# Patient Record
Sex: Female | Born: 1945
Health system: Southern US, Community
[De-identification: ages and names within clinical notes are randomized; demographics above are authoritative.]

## PROBLEM LIST (undated history)

## (undated) DIAGNOSIS — K219 Gastro-esophageal reflux disease without esophagitis: Secondary | ICD-10-CM

## (undated) DIAGNOSIS — F329 Major depressive disorder, single episode, unspecified: Secondary | ICD-10-CM

## (undated) DIAGNOSIS — F32A Depression, unspecified: Secondary | ICD-10-CM

## (undated) DIAGNOSIS — Z5189 Encounter for other specified aftercare: Secondary | ICD-10-CM

## (undated) DIAGNOSIS — E7800 Pure hypercholesterolemia, unspecified: Secondary | ICD-10-CM

## (undated) DIAGNOSIS — Z8719 Personal history of other diseases of the digestive system: Secondary | ICD-10-CM

## (undated) DIAGNOSIS — D649 Anemia, unspecified: Secondary | ICD-10-CM

## (undated) DIAGNOSIS — I10 Essential (primary) hypertension: Secondary | ICD-10-CM

## (undated) DIAGNOSIS — N135 Crossing vessel and stricture of ureter without hydronephrosis: Secondary | ICD-10-CM

## (undated) DIAGNOSIS — M1990 Unspecified osteoarthritis, unspecified site: Secondary | ICD-10-CM

## (undated) DIAGNOSIS — I480 Paroxysmal atrial fibrillation: Secondary | ICD-10-CM

## (undated) DIAGNOSIS — E039 Hypothyroidism, unspecified: Secondary | ICD-10-CM

## (undated) DIAGNOSIS — I2510 Atherosclerotic heart disease of native coronary artery without angina pectoris: Secondary | ICD-10-CM

## (undated) DIAGNOSIS — I495 Sick sinus syndrome: Secondary | ICD-10-CM

## (undated) DIAGNOSIS — I219 Acute myocardial infarction, unspecified: Secondary | ICD-10-CM

## (undated) DIAGNOSIS — IMO0001 Reserved for inherently not codable concepts without codable children: Secondary | ICD-10-CM

## (undated) HISTORY — PX: CORONARY ANGIOPLASTY: SHX604

## (undated) HISTORY — DX: Pure hypercholesterolemia, unspecified: E78.00

## (undated) HISTORY — DX: Gastro-esophageal reflux disease without esophagitis: K21.9

## (undated) HISTORY — DX: Atherosclerotic heart disease of native coronary artery without angina pectoris: I25.10

## (undated) HISTORY — DX: Essential (primary) hypertension: I10

## (undated) HISTORY — PX: BREAST SURGERY: SHX581

---

## 1995-10-09 ENCOUNTER — Other Ambulatory Visit: Payer: Self-pay

## 1998-01-01 ENCOUNTER — Other Ambulatory Visit: Admission: RE | Admit: 1998-01-01 | Discharge: 1998-01-01 | Payer: Self-pay | Admitting: Obstetrics and Gynecology

## 1999-01-01 ENCOUNTER — Other Ambulatory Visit: Admission: RE | Admit: 1999-01-01 | Discharge: 1999-01-01 | Payer: Self-pay | Admitting: Obstetrics and Gynecology

## 1999-04-16 ENCOUNTER — Ambulatory Visit (HOSPITAL_COMMUNITY): Admission: RE | Admit: 1999-04-16 | Discharge: 1999-04-16 | Payer: Self-pay | Admitting: Surgery

## 1999-04-16 ENCOUNTER — Encounter (INDEPENDENT_AMBULATORY_CARE_PROVIDER_SITE_OTHER): Payer: Self-pay

## 1999-04-16 ENCOUNTER — Encounter: Payer: Self-pay | Admitting: Surgery

## 1999-04-24 ENCOUNTER — Encounter: Payer: Self-pay | Admitting: Surgery

## 1999-04-24 ENCOUNTER — Ambulatory Visit (HOSPITAL_BASED_OUTPATIENT_CLINIC_OR_DEPARTMENT_OTHER): Admission: RE | Admit: 1999-04-24 | Discharge: 1999-04-24 | Payer: Self-pay | Admitting: Surgery

## 1999-06-03 ENCOUNTER — Encounter: Payer: Self-pay | Admitting: Family Medicine

## 1999-06-03 ENCOUNTER — Encounter: Admission: RE | Admit: 1999-06-03 | Discharge: 1999-06-03 | Payer: Self-pay | Admitting: Family Medicine

## 1999-07-09 ENCOUNTER — Encounter: Payer: Self-pay | Admitting: Emergency Medicine

## 1999-07-09 ENCOUNTER — Inpatient Hospital Stay (HOSPITAL_COMMUNITY): Admission: EM | Admit: 1999-07-09 | Discharge: 1999-07-10 | Payer: Self-pay | Admitting: Emergency Medicine

## 2000-01-20 ENCOUNTER — Ambulatory Visit (HOSPITAL_COMMUNITY): Admission: RE | Admit: 2000-01-20 | Discharge: 2000-01-20 | Payer: Self-pay | Admitting: Gastroenterology

## 2000-01-20 ENCOUNTER — Encounter (INDEPENDENT_AMBULATORY_CARE_PROVIDER_SITE_OTHER): Payer: Self-pay | Admitting: Specialist

## 2000-03-18 ENCOUNTER — Encounter: Admission: RE | Admit: 2000-03-18 | Discharge: 2000-03-18 | Payer: Self-pay | Admitting: Family Medicine

## 2000-03-18 ENCOUNTER — Encounter: Payer: Self-pay | Admitting: Family Medicine

## 2000-04-19 ENCOUNTER — Other Ambulatory Visit: Admission: RE | Admit: 2000-04-19 | Discharge: 2000-04-19 | Payer: Self-pay | Admitting: *Deleted

## 2001-04-05 ENCOUNTER — Other Ambulatory Visit: Admission: RE | Admit: 2001-04-05 | Discharge: 2001-04-05 | Payer: Self-pay | Admitting: *Deleted

## 2001-09-27 ENCOUNTER — Encounter: Admission: RE | Admit: 2001-09-27 | Discharge: 2001-09-27 | Payer: Self-pay | Admitting: Family Medicine

## 2001-09-27 ENCOUNTER — Encounter: Payer: Self-pay | Admitting: Family Medicine

## 2002-07-02 ENCOUNTER — Other Ambulatory Visit: Admission: RE | Admit: 2002-07-02 | Discharge: 2002-07-02 | Payer: Self-pay | Admitting: Obstetrics and Gynecology

## 2002-10-18 ENCOUNTER — Encounter: Admission: RE | Admit: 2002-10-18 | Discharge: 2002-10-18 | Payer: Self-pay | Admitting: Family Medicine

## 2002-10-18 ENCOUNTER — Encounter: Payer: Self-pay | Admitting: Family Medicine

## 2003-07-08 ENCOUNTER — Other Ambulatory Visit: Admission: RE | Admit: 2003-07-08 | Discharge: 2003-07-08 | Payer: Self-pay | Admitting: Obstetrics and Gynecology

## 2003-10-21 ENCOUNTER — Encounter: Admission: RE | Admit: 2003-10-21 | Discharge: 2003-10-21 | Payer: Self-pay | Admitting: Family Medicine

## 2004-08-13 ENCOUNTER — Other Ambulatory Visit: Admission: RE | Admit: 2004-08-13 | Discharge: 2004-08-13 | Payer: Self-pay | Admitting: Obstetrics and Gynecology

## 2004-12-25 ENCOUNTER — Encounter: Admission: RE | Admit: 2004-12-25 | Discharge: 2004-12-25 | Payer: Self-pay | Admitting: Family Medicine

## 2005-11-04 ENCOUNTER — Other Ambulatory Visit: Admission: RE | Admit: 2005-11-04 | Discharge: 2005-11-04 | Payer: Self-pay | Admitting: Obstetrics and Gynecology

## 2006-02-01 ENCOUNTER — Encounter: Admission: RE | Admit: 2006-02-01 | Discharge: 2006-02-01 | Payer: Self-pay | Admitting: Obstetrics and Gynecology

## 2007-03-01 ENCOUNTER — Encounter: Admission: RE | Admit: 2007-03-01 | Discharge: 2007-03-01 | Payer: Self-pay | Admitting: Obstetrics and Gynecology

## 2007-03-16 ENCOUNTER — Other Ambulatory Visit: Admission: RE | Admit: 2007-03-16 | Discharge: 2007-03-16 | Payer: Self-pay | Admitting: Obstetrics and Gynecology

## 2008-04-02 ENCOUNTER — Encounter: Admission: RE | Admit: 2008-04-02 | Discharge: 2008-04-02 | Payer: Self-pay | Admitting: Obstetrics and Gynecology

## 2008-05-21 ENCOUNTER — Other Ambulatory Visit: Admission: RE | Admit: 2008-05-21 | Discharge: 2008-05-21 | Payer: Self-pay | Admitting: Family Medicine

## 2008-08-13 ENCOUNTER — Other Ambulatory Visit: Admission: RE | Admit: 2008-08-13 | Discharge: 2008-08-13 | Payer: Self-pay | Admitting: Obstetrics and Gynecology

## 2009-10-13 ENCOUNTER — Encounter: Admission: RE | Admit: 2009-10-13 | Discharge: 2009-10-13 | Payer: Self-pay | Admitting: Obstetrics and Gynecology

## 2010-12-18 NOTE — Consult Note (Signed)
[place]. [facility]  Patient:    Cheryl [name]                         MRN: [id number] Proc. Date: 07/10/99 Adm. Date:  [id number] Attending:  Anastasio Auerbach CC:         Luna Fuse, M.D.             Miguel Aschoff, M.D.                          Consultation Report  HISTORY OF PRESENT ILLNESS:  Cheryl [name] is a very pleasant 65 year old white female who has a history of hypertension, hypercholesterolemia, and gastroesophageal reflux disease.  Yesterday afternoon, the patient developed acute onset of sudden tachypalpitations associated with dry mouth, thick tongue, dyspnea, and lightheadedness.  She states she has had a couple of episodes like this in the ast over the years.  Her most recent was last Sunday, but prior to this, it was several years since her last episode.  She went to Dr. Lucille Passy office, and ECG there demonstrated supraventricular tachycardia, with a rate of 150.  She was referred for further evaluation.  Along with this, the patient has complained of epigastric discomfort, like indigestion.  She has early satiety, and has had some recent weight loss, as well.  She was placed on Prilosec recently for her acid reflux, but she was concerned that this caused her tachycardia last Sunday, so she stopped taking it.  Apparent recent gallbladder ultrasound was normal.  PAST MEDICAL HISTORY:  Significant for hypertension, hypercholesterolemia, gastroesophageal reflux disease, supraventricular tachycardia.  MEDICATIONS:  Include Cardizem CD 300 mg per day, Prevacid 30 mg per day, aspirin daily.  ALLERGIES:  She is allergic to PENICILLIN and CODEINE.  SOCIAL HISTORY:  She is married.  She quit smoking in 1979.  Denies alcohol or rug use.  FAMILY HISTORY:  Positive for coronary disease and mother having MI at age 24.  Father died of an MI at age 71.  She has no family history of diabetes or hypertension.  PHYSICAL  EXAMINATION:  GENERAL:  Patient is a pleasant white female in no apparent distress.  VITAL SIGNS:  Blood pressure 146/80, pulse 76 and regular, respirations normal,  temperature afebrile.  HEENT:  Unremarkable.  NECK:  Supple without JVD, adenopathy, thyromegaly, or bruits.  LUNGS:  Clear.  CARDIAC:  Reveals regular rate and rhythm, normal S1 and S2 without gallops, murmurs, rubs, or clicks.  ABDOMEN:  Obese, soft, with some epigastric tenderness to palpation.  There is o hepatosplenomegaly or masses.  EXTREMITIES:  Femoral and pedal pulses are 2+ and symmetric.  She has no edema.  NEUROLOGIC:  Intact.  LABORATORY DATA:  Hemoglobin is 13.2, white count 6800, platelets 382,000.  CK s 63 with negative MB.  Potassium is 3.0, BUN 11, glucose of 116.  ECG shows normal sinus rhythm, nonspecific ST changes.  Chest x-ray shows no active disease.  IMPRESSION: 1. Supraventricular tachycardia, probably AV node reentry versus atrial flutter    with 2 to 1 block. 2. Hypertension. 3. Atypical chest pain.  Symptoms are most consistent with gastroesophageal reflux    disease.  However, she does have multiple cardiac risk factors, and coronary    disease needs to be considered. 4. Gastroesophageal reflux disease.  PLAN:  I think it would be appropriate to continue on Cardizem CD at 300 mg a day  and increase her antacid therapy.  We can schedule the patient for an outpatient stress Cardiolite study to evaluate for potential coronary ischemia. Otherwise, I think she is stable for discharge today. DD:  07/10/99 TD:  07/10/99 Job: [id number]

## 2010-12-18 NOTE — Procedures (Signed)
[facility]  Patient:    Cheryl [name], Cheryl [name]                        MRN: [id number] Proc. Date: 01/20/00 Adm. Date:  [id number] Disc. Date: [id number] Attending:  Rich Brave CC:         [facility]                           Procedure Report  PROCEDURE:  Upper endoscopy with biopsies.  INDICATION FOR PROCEDURE:  A 65 year old female with a history of iron deficiency anemia and reflux symptoms.  FINDINGS:  Minimal hiatal hernia.  DESCRIPTION OF PROCEDURE:   The nature, purpose and risks of the procedure had been discussed with the patient who provided written consent. Sedation was fentanyl 62.5 mcg and Versed 8 mg IV without arrhythmias or desaturation.  The Olympus video endoscope was passed under direct vision. The posterior commissure appeared slightly thickened but the larynx was otherwise normal. The esophagus was easily entered and had normal mucosa without evidence of reflux esophagitis, Barretts esophagitis, gastroesophageal reflux, varices, infection or neoplasia. No ring or stricture was present. A small hiatal hernia measuring about 1 cm was intermittently present but on retroflexed view no significant hiatal hernia was observed. The stomach contained on residual and had normal mucosa without evidence of gastritis, erosions, ulcers, polyps or masses and the pylorus, duodenal bulb and second duodenum looked normal. Random duodenal mucosal biopsies were obtained to rule out celiac disease in view of the longstanding history of iron deficiency anemia which is attributed most likely to the patients history of menstruation. The scope was then removed from the patient who tolerated the procedure well and without apparent complications.  IMPRESSION: 1. Possible laryngeal pharyngeal manifestation of gastroesophageal reflux    disease characterized by thickening of the posterior commissure as    described above. 2. No  esophagitis to correlate with heartburn symptoms. 3. No overt cause for iron deficiency anemia endoscopically evident.  PLAN:  Await pathology on biopsies. Proceed to colonoscopic evaluation. DD: 01/20/00 TD:  01/22/00 Job: [id number]

## 2010-12-18 NOTE — Procedures (Signed)
[facility]  Patient:    Cheryl [name], Cheryl [name]                        MRN: [id number] Proc. Date: 01/20/00 Adm. Date:  [id number] Disc. Date: [id number] Attending:  Rich Brave CC:         [facility]                           Procedure Report  PROCEDURE:  Colonoscopy with biopsies.  ENDOSCOPIST:  Florencia Reasons, M.D.  INDICATION:  [age]-year-old female with longstanding history of iron deficiency anemia, felt most likely to be of menstrual origin.  FINDINGS:  Normal exam to the cecum, except for patchy erythema felt most likely to be due to scope trauma.  DESCRIPTION OF PROCEDURE:  The nature, purpose and risks of the procedure had been discussed with the patient, who provided written consent.  Sedation for this procedure and the upper endoscopy which preceded it totaled Fentanyl 120 mcg and Versed 10 mg IV, without arrhythmias or desaturation.  The Olympus adult video colonoscope was advanced around the colon, with some looping that was overcome by a combination of factors including sterno-abdominal compression and having patient in supine position; we also turned the patient into the right lateral decubitus position to allow the tip of the scope to enter the base of the cecum, which was identified by typical cecal appearance and the absence of further lumen.  Pullback was then performed.  The quality of the prep was very good and it was felt that all areas were adequately seen.  The only abnormality on this exam was some patchy erythema of the colonic mucosa here and there, and especially at "fulcrum" points, felt most likely to be due to intramucosal hemorrhage from scope trauma.  There was a little bit of mucoid overlying exudate which would basically wash off but no erosive changes or ulceration, no diffuse colitis, normal colonic mucosa in the intervening areas, no ulcerations.  No polyps, cancer, diffuse  colitis, vascular malformations or diverticular disease were appreciated and retroflexion in the rectum was unremarkable, although an optimal view of the distal rectum was not obtained due to an overlying valve of [place]; antegrade viewing of the distal rectum and anal canal, however, was unremarkable, including the absence of any significant hemorrhoids.  The patient tolerated the procedure well and there were no apparent complications.  IMPRESSION:  Essentially normal examination, with questionable colitis (more likely scope trauma) here and there in the colon, as described above.  PLAN:  Await pathology on biopsies of the possibly inflamed areas.  It is not felt that the observed findings today would account for iron deficiency anemia. DD:  01/20/00 TD:  01/22/00 Job: [id number]

## 2011-08-03 DIAGNOSIS — I2510 Atherosclerotic heart disease of native coronary artery without angina pectoris: Secondary | ICD-10-CM

## 2011-08-03 HISTORY — DX: Atherosclerotic heart disease of native coronary artery without angina pectoris: I25.10

## 2011-08-27 ENCOUNTER — Encounter: Payer: Self-pay | Admitting: Internal Medicine

## 2011-09-07 ENCOUNTER — Telehealth: Payer: Self-pay | Admitting: Internal Medicine

## 2011-09-07 NOTE — Telephone Encounter (Signed)
I was contacted by [employer].  The patient had a 3 second pause after converting from atrial fibrillation to sinus rhythm.  She was mildly light-headed, but no syncope.  She is in sinus now.  Tele strips will be faxed to office.

## 2011-09-09 ENCOUNTER — Encounter: Payer: Self-pay | Admitting: Internal Medicine

## 2011-09-10 ENCOUNTER — Ambulatory Visit (INDEPENDENT_AMBULATORY_CARE_PROVIDER_SITE_OTHER): Payer: Self-pay | Admitting: Internal Medicine

## 2011-09-10 ENCOUNTER — Encounter: Payer: Self-pay | Admitting: Internal Medicine

## 2011-09-10 VITALS — BP 148/88 | HR 78 | Ht 63.0 in | Wt 185.0 lb

## 2011-09-10 DIAGNOSIS — R0989 Other specified symptoms and signs involving the circulatory and respiratory systems: Secondary | ICD-10-CM

## 2011-09-10 DIAGNOSIS — I4891 Unspecified atrial fibrillation: Secondary | ICD-10-CM

## 2011-09-13 ENCOUNTER — Ambulatory Visit (INDEPENDENT_AMBULATORY_CARE_PROVIDER_SITE_OTHER): Payer: BC Managed Care – PPO | Admitting: Internal Medicine

## 2011-09-13 ENCOUNTER — Encounter: Payer: Self-pay | Admitting: Internal Medicine

## 2011-09-13 VITALS — BP 142/76 | HR 68 | Ht 63.0 in | Wt 184.4 lb

## 2011-09-13 DIAGNOSIS — I498 Other specified cardiac arrhythmias: Secondary | ICD-10-CM

## 2011-09-13 DIAGNOSIS — I10 Essential (primary) hypertension: Secondary | ICD-10-CM

## 2011-09-13 DIAGNOSIS — R001 Bradycardia, unspecified: Secondary | ICD-10-CM

## 2011-09-13 DIAGNOSIS — I4891 Unspecified atrial fibrillation: Secondary | ICD-10-CM

## 2011-09-13 NOTE — Progress Notes (Signed)
HPI Cheryl [name] is referred today by Dr. Mayford Knife for evaluation of symptomatic tachy-brady syndrome. She has a h/o atrial arrhythmias for years and has recently developed episodes of dizziness associated with lightheadedness. No frank syncope. She worn a cardiac monitor which demonstrates atrial fib with an RVR as well as daytime pauses of over 3 seconds. She has never tried an anti-arrhythmic drug outside of calcium channel or beta blockers. She has hypertension and despite being on doses of, she remains hypertensive. Allergies  Allergen Reactions  . Codeine     nausea     Current Outpatient Prescriptions  Medication Sig Dispense Refill  . CRESTOR 10 MG tablet Take 10 mg by mouth Daily.      Marland Kitchen diltiazem (CARDIZEM CD) 300 MG 24 hr capsule Take 300 mg by mouth Daily.      Marland Kitchen levothyroxine (SYNTHROID, LEVOTHROID) 100 MCG tablet Take 100 mcg by mouth Daily.      . metoprolol succinate (TOPROL-XL) 50 MG 24 hr tablet Take 50 mg by mouth Daily.      . potassium chloride SA (K-DUR,KLOR-CON) 20 MEQ tablet Take 20 mEq by mouth Daily.      Marland Kitchen triamterene-hydrochlorothiazide (MAXZIDE) 75-50 MG per tablet Take 75 mg by mouth Daily.         History reviewed. No pertinent past medical history.  ROS:   All systems reviewed and negative except as noted in the HPI.   History reviewed. No pertinent past surgical history.   No family history on file.   History   Social History  . Marital Status: Married    Spouse Name: N/A    Number of Children: N/A  . Years of Education: N/A   Occupational History  . Not on file.   Social History Main Topics  . Smoking status: Former Games developer  . Smokeless tobacco: Not on file  . Alcohol Use: Not on file  . Drug Use: Not on file  . Sexually Active: Not on file   Other Topics Concern  . Not on file   Social History Narrative  . No narrative on file     BP 142/76  Pulse 68  Ht 5\' 3"  (1.6 m)  Wt 83.643 kg (184 lb 6.4 oz)  BMI 32.66  kg/m2  Physical Exam:  Well appearing middle-aged woman, NAD HEENT: Unremarkable Neck:  No JVD, no thyromegally Lungs:  Clear with no wheezes, rales, or rhonchi. HEART:  Regular rate rhythm, no murmurs, no rubs, no clicks Abd:  Obese, soft, positive bowel sounds, no organomegally, no rebound, no guarding Ext:  2 plus pulses, no edema, no cyanosis, no clubbing Skin:  No rashes no nodules Neuro:  CN II through XII intact, motor grossly intact  Tele - atrial fibrillation with posttermination pauses as well as sinus rhythm and sinus bradycardia  Assess/Plan:

## 2011-09-13 NOTE — Assessment & Plan Note (Signed)
Her blood pressure is elevated today. Stopping her calcium channel blocker may result in additional blood pressure increases. If this is the case, we would then consider adding another agent for blood pressure control.

## 2011-09-13 NOTE — Assessment & Plan Note (Signed)
I discussed the treatment options with the patient and her husband. One option would be admission to the hospital with initiation of tikosyn. We would stop her calcium channel blocker at that time. A second option would be initiation of flecainide and insertion of a permanent pacemaker. The advantages and disadvantages of each approach have been discussed in detail. In hopes of avoiding a pacemaker, and maintaining sinus rhythm, she wishes to initially try Tikosyn and this will be initiated in the next several days. She understands that this approach may not work and trying flecainide may ultimately be required in conjunction with a permanent pacemaker.

## 2011-09-13 NOTE — Patient Instructions (Signed)
Your physician recommends that you return for lab work on: Monday 09/20/11- bmp/magnesium   You will be admitted for Tikosyn loading on Tuesday 09/21/11 if your magnesium and potassium levels are ok on Monday

## 2011-09-20 ENCOUNTER — Telehealth: Payer: Self-pay | Admitting: Internal Medicine

## 2011-09-20 ENCOUNTER — Other Ambulatory Visit (INDEPENDENT_AMBULATORY_CARE_PROVIDER_SITE_OTHER): Payer: BC Managed Care – PPO | Admitting: *Deleted

## 2011-09-20 DIAGNOSIS — I4891 Unspecified atrial fibrillation: Secondary | ICD-10-CM

## 2011-09-20 LAB — BASIC METABOLIC PANEL
BUN: 16 mg/dL (ref 6–23)
Calcium: 9.3 mg/dL (ref 8.4–10.5)
Chloride: 99 mEq/L (ref 96–112)
Potassium: 3.2 mEq/L — ABNORMAL LOW (ref 3.5–5.1)
Sodium: 141 mEq/L (ref 135–145)

## 2011-09-20 LAB — MAGNESIUM: Magnesium: 1.8 mg/dL (ref 1.5–2.5)

## 2011-09-20 NOTE — Telephone Encounter (Signed)
New Problem   Patient would like return call on hm# regarding tikosyn admission previously discussed by Dr. Ladona Ridgel. Patient can be reached at hm# [phone]

## 2011-09-22 ENCOUNTER — Other Ambulatory Visit: Payer: BC Managed Care – PPO

## 2011-09-22 ENCOUNTER — Encounter (INDEPENDENT_AMBULATORY_CARE_PROVIDER_SITE_OTHER): Payer: BC Managed Care – PPO | Admitting: Pharmacist

## 2011-09-22 DIAGNOSIS — I4891 Unspecified atrial fibrillation: Secondary | ICD-10-CM

## 2011-09-22 LAB — BASIC METABOLIC PANEL
Chloride: 100 mEq/L (ref 96–112)
Glucose, Bld: 106 mg/dL — ABNORMAL HIGH (ref 70–99)
Sodium: 141 mEq/L (ref 135–145)

## 2011-09-22 LAB — MAGNESIUM: Magnesium: 1.5 mg/dL (ref 1.5–2.5)

## 2011-09-23 NOTE — Telephone Encounter (Signed)
Cheryl Endo, RN spoke with pt on 2/18.  She was instructed to take of K on 2/18 in the PM and three times on 2/19 and we would recheck her labs on 2/20 for possible admission to start Tikosyn.

## 2011-09-27 ENCOUNTER — Telehealth: Payer: Self-pay | Admitting: Internal Medicine

## 2011-09-27 NOTE — Telephone Encounter (Signed)
New Msg: Pt calling wanting to speak with nurse/MD regarding pt tikosyn and if MD wants pt to come in for appt.   Please returning pt call to discuss further.

## 2011-09-27 NOTE — Telephone Encounter (Signed)
Will schedule a follow up with Dr Ladona Ridgel in the next couple of weeks

## 2011-09-29 NOTE — Progress Notes (Signed)
HPI Case reviewed.  Patient referred by Dr. Mayford Knife.  Needs to be seen by EP.  Appt set for next clinic day.   Assessment and Plan:

## 2011-10-01 DIAGNOSIS — I495 Sick sinus syndrome: Secondary | ICD-10-CM

## 2011-10-01 HISTORY — DX: Sick sinus syndrome: I49.5

## 2011-10-06 ENCOUNTER — Ambulatory Visit (INDEPENDENT_AMBULATORY_CARE_PROVIDER_SITE_OTHER): Payer: BC Managed Care – PPO | Admitting: Internal Medicine

## 2011-10-06 ENCOUNTER — Encounter: Payer: Self-pay | Admitting: *Deleted

## 2011-10-06 DIAGNOSIS — I10 Essential (primary) hypertension: Secondary | ICD-10-CM

## 2011-10-06 DIAGNOSIS — I4891 Unspecified atrial fibrillation: Secondary | ICD-10-CM

## 2011-10-06 NOTE — Assessment & Plan Note (Signed)
Her blood pressure today is fairly well controlled. Additional medication adjustment will be recommended after her pacemaker has been inserted.

## 2011-10-06 NOTE — Progress Notes (Signed)
HPI Cheryl [name] returns today for followup. When I saw the patient several weeks ago, we identified 2 problems. First she had paroxysmal atrial fibrillation and second she had sinus bradycardia with pauses of up to 3 seconds. Her pauses occurred when atrial fibrillation would terminate. We discussed initiation of dofetilide versus a pacemaker and other antiarrhythmic drugs. Initially, dofetilide was chosen but after careful consideration, the patient noted that she has trouble with compliance. She frequently takes her medicines hours late or sometimes forgets to take them altogether. For this reason it was deemed most appropriate not to use dofetilide. She returns for additional followup. She has had no frank syncope but does note occasional dizzy spells. She feels her heart go into atrial fibrillation. Allergies  Allergen Reactions  . Codeine     nausea     Current Outpatient Prescriptions  Medication Sig Dispense Refill  . aspirin 81 MG tablet Take 81 mg by mouth daily.      . CRESTOR 10 MG tablet Take 10 mg by mouth Daily.      Marland Kitchen diltiazem (CARDIZEM CD) 300 MG 24 hr capsule Take 300 mg by mouth Daily.      Marland Kitchen levothyroxine (SYNTHROID, LEVOTHROID) 100 MCG tablet Take 100 mcg by mouth Daily.      . metoprolol succinate (TOPROL-XL) 50 MG 24 hr tablet Take 50 mg by mouth Daily.      . potassium chloride SA (K-DUR,KLOR-CON) 20 MEQ tablet Take 20 mEq by mouth Daily.      Marland Kitchen triamterene-hydrochlorothiazide (MAXZIDE) 75-50 MG per tablet Take 75 mg by mouth Daily.         Past Medical History  Diagnosis Date  . HTN (hypertension)   . Hypercholesteremia   . GERD (gastroesophageal reflux disease)   . Supraventricular tachycardia     ROS:   All systems reviewed and negative except as noted in the HPI.   No past surgical history on file.   Family History  Problem Relation Age of Onset  . Heart attack Father 53     History   Social History  . Marital Status: Married    Spouse Name:  N/A    Number of Children: N/A  . Years of Education: N/A   Occupational History  . Not on file.   Social History Main Topics  . Smoking status: Former Smoker    Quit date: 08/02/1977  . Smokeless tobacco: Not on file  . Alcohol Use: No  . Drug Use: No  . Sexually Active: Not on file   Other Topics Concern  . Not on file   Social History Narrative  . No narrative on file     BP 138/80  Pulse 68  Ht 5\' 3"  (1.6 m)  Wt 85.911 kg (189 lb 6.4 oz)  BMI 33.55 kg/m2  Physical Exam:  Well appearing middle-aged woman, NAD HEENT: Unremarkable Neck:  No JVD, no thyromegally Lungs:  Clear with no wheezes, rales, or rhonchi. HEART:  Regular rate rhythm, no murmurs, no rubs, no clicks Abd:  soft, positive bowel sounds, no organomegally, no rebound, no guarding Ext:  2 plus pulses, no edema, no cyanosis, no clubbing Skin:  No rashes no nodules Neuro:  CN II through XII intact, motor grossly intact    Assess/Plan:

## 2011-10-06 NOTE — Assessment & Plan Note (Signed)
Today we discussed the treatment options for atrial fibrillation and tachybradycardia syndrome. At this point I recommended proceeding with permanent pacemaker insertion secondary to her prolonged pauses. In addition antiarrhythmic drug therapy with flecainide will be started after her pacemaker has been inserted.

## 2011-10-06 NOTE — Patient Instructions (Signed)

## 2011-10-07 ENCOUNTER — Other Ambulatory Visit: Payer: Self-pay | Admitting: *Deleted

## 2011-10-07 DIAGNOSIS — I4891 Unspecified atrial fibrillation: Secondary | ICD-10-CM

## 2011-10-08 NOTE — Progress Notes (Unsigned)
HPI [name] was seen today for possible admission to begin Tikosyn.   She was recently seen by Dr. Ladona Ridgel for her atrial arrhythmias.  Her recent cardiac monitor showed afib with RVR as well as pauses over 3 seconds.  They discussed 2 different treatment options, including Tikosyn versus pacemaker placement and flecainide. Pt decided to proceed with Tikosyn at this time.    Reviewed pt's medication list.  She has not ever taken any other antiarrhythmic medications.  She is currently rate controlled on diltiazem.  She is not on any QTc prolongating medications, but she is on triamterene/HCTZ, which will have to be discontinued when she starts Tikosyn.  Compliance reviewed with pt.  She states she may have trouble remembering to take her medications on a consistent basis.    EKG- NSR with QTc- 429  Current Outpatient Prescriptions on File Prior to Visit  Medication Sig Dispense Refill  . CRESTOR 10 MG tablet Take 10 mg by mouth Daily.      Marland Kitchen diltiazem (CARDIZEM CD) 300 MG 24 hr capsule Take 300 mg by mouth Daily.      Marland Kitchen levothyroxine (SYNTHROID, LEVOTHROID) 100 MCG tablet Take 100 mcg by mouth Daily.      . metoprolol succinate (TOPROL-XL) 50 MG 24 hr tablet Take 50 mg by mouth Daily.      . potassium chloride SA (K-DUR,KLOR-CON) 20 MEQ tablet Take 20 mEq by mouth Daily.      Marland Kitchen triamterene-hydrochlorothiazide (MAXZIDE) 75-50 MG per tablet Take 75 mg by mouth Daily.        Allergies  Allergen Reactions  . Codeine     nausea    ASSESSMENT/PLAN I discussed concerns of patient compliance with Dr. Ladona Ridgel.  Since pt is unsure if she would be able to consistently take the medication, he thought it best to delay hospitalization and discuss other options.  Pt is agreeable to this plan.  She will be set up with an appt to see Dr. Ladona Ridgel in the near future.

## 2011-10-15 ENCOUNTER — Encounter (HOSPITAL_COMMUNITY): Payer: Self-pay | Admitting: Pharmacy Technician

## 2011-10-21 ENCOUNTER — Other Ambulatory Visit (INDEPENDENT_AMBULATORY_CARE_PROVIDER_SITE_OTHER): Payer: BC Managed Care – PPO

## 2011-10-21 DIAGNOSIS — I4891 Unspecified atrial fibrillation: Secondary | ICD-10-CM

## 2011-10-21 LAB — BASIC METABOLIC PANEL
CO2: 32 mEq/L (ref 19–32)
Calcium: 9.4 mg/dL (ref 8.4–10.5)
Chloride: 96 mEq/L (ref 96–112)
Sodium: 137 mEq/L (ref 135–145)

## 2011-10-21 LAB — CBC WITH DIFFERENTIAL/PLATELET
Basophils Absolute: 0.1 10*3/uL (ref 0.0–0.1)
Eosinophils Absolute: 0.1 10*3/uL (ref 0.0–0.7)
Lymphocytes Relative: 44.6 % (ref 12.0–46.0)
MCHC: 34.1 g/dL (ref 30.0–36.0)
Monocytes Relative: 9.1 % (ref 3.0–12.0)
Platelets: 272 10*3/uL (ref 150.0–400.0)
RDW: 13.8 % (ref 11.5–14.6)

## 2011-10-21 LAB — PROTIME-INR
INR: 1 ratio (ref 0.8–1.0)
Prothrombin Time: 11 s (ref 10.2–12.4)

## 2011-10-22 ENCOUNTER — Telehealth: Payer: Self-pay | Admitting: *Deleted

## 2011-10-22 NOTE — Telephone Encounter (Signed)
Fu call °Patient returning your call °

## 2011-10-22 NOTE — Telephone Encounter (Signed)
Reviewing labs and noted K+ WAS 2.9--PT scheduled for pacer insertion on 3/28--i tried to call pt but no answer--LM for her to call back--also reviewed labs with dr Patty Sermons and he would like pt to increase KCL to  TID with repeat BMET two days before procedure--when pt calls back will give her the instructions--nt

## 2011-10-25 ENCOUNTER — Other Ambulatory Visit: Payer: Self-pay | Admitting: *Deleted

## 2011-10-25 MED ORDER — POTASSIUM CHLORIDE CRYS ER 20 MEQ PO TBCR: 20.0000 meq | EXTENDED_RELEASE_TABLET | Freq: Two times a day (BID) | ORAL | Status: DC

## 2011-10-25 NOTE — Telephone Encounter (Signed)
Spoke with patient and she is aware to increase her Potassium.  I sent in a RX for her also

## 2011-10-25 NOTE — Telephone Encounter (Signed)
Follow up from previous called.   Patient calling back to speak with nurse regarding upcoming procedure, also to discuss k+

## 2011-10-27 MED ORDER — CEFAZOLIN SODIUM-DEXTROSE 2-3 GM-% IV SOLR
2.0000 g | INTRAVENOUS | Status: AC
  Administered 2011-10-28: 2 g via INTRAVENOUS
  Filled 2011-10-27: qty 50

## 2011-10-27 MED ORDER — SODIUM CHLORIDE 0.9 % IR SOLN
80.0000 mg | Status: AC
  Administered 2011-10-28: 80 mg
  Filled 2011-10-27: qty 2

## 2011-10-27 MED ORDER — SODIUM CHLORIDE 0.45 % IV SOLN: INTRAVENOUS | Status: DC

## 2011-10-27 MED ORDER — CHLORHEXIDINE GLUCONATE 4 % EX LIQD
60.0000 mL | Freq: Once | CUTANEOUS | Status: DC
  Filled 2011-10-27: qty 60

## 2011-10-27 MED ORDER — SODIUM CHLORIDE 0.9 % IV SOLN
INTRAVENOUS | Status: DC
  Administered 2011-10-28: 06:00:00 via INTRAVENOUS

## 2011-10-28 ENCOUNTER — Encounter (HOSPITAL_COMMUNITY)
Admission: RE | Disposition: A | Discharge status: Home / Self Care | Payer: Self-pay | Source: Ambulatory Visit | Attending: Internal Medicine

## 2011-10-28 ENCOUNTER — Ambulatory Visit (HOSPITAL_COMMUNITY)
Admission: RE | Admit: 2011-10-28 | Discharge: 2011-10-29 | Disposition: A | Discharge status: Home / Self Care | Payer: BC Managed Care – PPO | Source: Ambulatory Visit | Attending: Internal Medicine | Admitting: Internal Medicine

## 2011-10-28 ENCOUNTER — Other Ambulatory Visit: Payer: Self-pay

## 2011-10-28 ENCOUNTER — Encounter (HOSPITAL_COMMUNITY): Payer: Self-pay | Admitting: General Practice

## 2011-10-28 DIAGNOSIS — E7800 Pure hypercholesterolemia, unspecified: Secondary | ICD-10-CM | POA: Insufficient documentation

## 2011-10-28 DIAGNOSIS — I4891 Unspecified atrial fibrillation: Secondary | ICD-10-CM

## 2011-10-28 DIAGNOSIS — I495 Sick sinus syndrome: Secondary | ICD-10-CM | POA: Insufficient documentation

## 2011-10-28 DIAGNOSIS — E039 Hypothyroidism, unspecified: Secondary | ICD-10-CM | POA: Insufficient documentation

## 2011-10-28 DIAGNOSIS — Z23 Encounter for immunization: Secondary | ICD-10-CM | POA: Insufficient documentation

## 2011-10-28 DIAGNOSIS — I4819 Other persistent atrial fibrillation: Secondary | ICD-10-CM | POA: Insufficient documentation

## 2011-10-28 DIAGNOSIS — I10 Essential (primary) hypertension: Secondary | ICD-10-CM | POA: Insufficient documentation

## 2011-10-28 DIAGNOSIS — R001 Bradycardia, unspecified: Secondary | ICD-10-CM

## 2011-10-28 HISTORY — DX: Personal history of other diseases of the digestive system: Z87.19

## 2011-10-28 HISTORY — DX: Encounter for other specified aftercare: Z51.89

## 2011-10-28 HISTORY — DX: Sick sinus syndrome: I49.5

## 2011-10-28 HISTORY — DX: Unspecified osteoarthritis, unspecified site: M19.90

## 2011-10-28 HISTORY — PX: PERMANENT PACEMAKER INSERTION: SHX5480

## 2011-10-28 HISTORY — DX: Anemia, unspecified: D64.9

## 2011-10-28 HISTORY — DX: Reserved for inherently not codable concepts without codable children: IMO0001

## 2011-10-28 HISTORY — DX: Paroxysmal atrial fibrillation: I48.0

## 2011-10-28 HISTORY — DX: Hypothyroidism, unspecified: E03.9

## 2011-10-28 LAB — BASIC METABOLIC PANEL
BUN: 14 mg/dL (ref 6–23)
Chloride: 100 mEq/L (ref 96–112)
GFR calc Af Amer: 62 mL/min — ABNORMAL LOW (ref >90)
Potassium: 3.2 mEq/L — ABNORMAL LOW (ref 3.5–5.1)
Sodium: 140 mEq/L (ref 135–145)

## 2011-10-28 LAB — SURGICAL PCR SCREEN: Staphylococcus aureus: NEGATIVE

## 2011-10-28 SURGERY — PERMANENT PACEMAKER INSERTION
Anesthesia: LOCAL

## 2011-10-28 MED ORDER — HEPARIN (PORCINE) IN NACL 2-0.9 UNIT/ML-% IJ SOLN
INTRAMUSCULAR | Status: AC
  Filled 2011-10-28: qty 1000

## 2011-10-28 MED ORDER — LEVOTHYROXINE SODIUM 100 MCG PO TABS
100.0000 ug | ORAL_TABLET | Freq: Every day | ORAL | Status: DC
  Administered 2011-10-29: 100 ug via ORAL
  Filled 2011-10-28 (×2): qty 1

## 2011-10-28 MED ORDER — MUPIROCIN 2 % EX OINT
TOPICAL_OINTMENT | CUTANEOUS | Status: AC
  Administered 2011-10-28: 1 via NASAL
  Filled 2011-10-28: qty 22

## 2011-10-28 MED ORDER — FLECAINIDE ACETATE 100 MG PO TABS
100.0000 mg | ORAL_TABLET | Freq: Two times a day (BID) | ORAL | Status: DC
  Administered 2011-10-28 – 2011-10-29 (×3): 100 mg via ORAL
  Filled 2011-10-28 (×4): qty 1

## 2011-10-28 MED ORDER — LIDOCAINE HCL (PF) 1 % IJ SOLN
INTRAMUSCULAR | Status: AC
  Filled 2011-10-28: qty 60

## 2011-10-28 MED ORDER — CEFAZOLIN SODIUM-DEXTROSE 2-3 GM-% IV SOLR
INTRAVENOUS | Status: AC
  Administered 2011-10-28: 2 g via INTRAVENOUS
  Filled 2011-10-28: qty 50

## 2011-10-28 MED ORDER — METOPROLOL SUCCINATE ER 50 MG PO TB24
50.0000 mg | ORAL_TABLET | Freq: Every day | ORAL | Status: DC
  Administered 2011-10-28 – 2011-10-29 (×2): 50 mg via ORAL
  Filled 2011-10-28 (×2): qty 1

## 2011-10-28 MED ORDER — CEFAZOLIN SODIUM-DEXTROSE 2-3 GM-% IV SOLR
2.0000 g | Freq: Three times a day (TID) | INTRAVENOUS | Status: AC
  Administered 2011-10-28 – 2011-10-29 (×3): 2 g via INTRAVENOUS
  Filled 2011-10-28 (×4): qty 50

## 2011-10-28 MED ORDER — ASPIRIN 81 MG PO TABS: 81.0000 mg | ORAL_TABLET | Freq: Every day | ORAL | Status: DC

## 2011-10-28 MED ORDER — MIDAZOLAM HCL 5 MG/5ML IJ SOLN
INTRAMUSCULAR | Status: AC
  Filled 2011-10-28: qty 5

## 2011-10-28 MED ORDER — POTASSIUM CHLORIDE CRYS ER 20 MEQ PO TBCR
20.0000 meq | EXTENDED_RELEASE_TABLET | Freq: Two times a day (BID) | ORAL | Status: DC
  Administered 2011-10-28 – 2011-10-29 (×3): 20 meq via ORAL
  Filled 2011-10-28 (×4): qty 1

## 2011-10-28 MED ORDER — ASPIRIN 81 MG PO CHEW
81.0000 mg | CHEWABLE_TABLET | Freq: Every day | ORAL | Status: DC
  Administered 2011-10-28 – 2011-10-29 (×2): 81 mg via ORAL
  Filled 2011-10-28 (×2): qty 1

## 2011-10-28 MED ORDER — ACETAMINOPHEN 325 MG PO TABS
325.0000 mg | ORAL_TABLET | ORAL | Status: DC | PRN
  Administered 2011-10-28 – 2011-10-29 (×6): 650 mg via ORAL
  Filled 2011-10-28 (×6): qty 2

## 2011-10-28 MED ORDER — FENTANYL CITRATE 0.05 MG/ML IJ SOLN
INTRAMUSCULAR | Status: AC
  Filled 2011-10-28: qty 2

## 2011-10-28 MED ORDER — ONDANSETRON HCL 4 MG/2ML IJ SOLN: 4.0000 mg | Freq: Four times a day (QID) | INTRAMUSCULAR | Status: DC | PRN

## 2011-10-28 MED ORDER — TRIAMTERENE-HCTZ 75-50 MG PO TABS
1.0000 | ORAL_TABLET | Freq: Every day | ORAL | Status: DC
  Administered 2011-10-28 – 2011-10-29 (×2): 1 via ORAL
  Filled 2011-10-28 (×2): qty 1

## 2011-10-28 MED ORDER — INFLUENZA VIRUS VACC SPLIT PF IM SUSP
0.5000 mL | Freq: Once | INTRAMUSCULAR | Status: AC
  Administered 2011-10-29: 0.5 mL via INTRAMUSCULAR
  Filled 2011-10-28 (×2): qty 0.5

## 2011-10-28 NOTE — H&P (View-Only) (Signed)
HPI Cheryl [name] returns today for followup. When I saw the patient several weeks ago, we identified 2 problems. First she had paroxysmal atrial fibrillation and second she had sinus bradycardia with pauses of up to 3 seconds. Her pauses occurred when atrial fibrillation would terminate. We discussed initiation of dofetilide versus a pacemaker and other antiarrhythmic drugs. Initially, dofetilide was chosen but after careful consideration, the patient noted that she has trouble with compliance. She frequently takes her medicines hours late or sometimes forgets to take them altogether. For this reason it was deemed most appropriate not to use dofetilide. She returns for additional followup. She has had no frank syncope but does note occasional dizzy spells. She feels her heart go into atrial fibrillation. Allergies  Allergen Reactions  . Codeine     nausea     Current Outpatient Prescriptions  Medication Sig Dispense Refill  . aspirin 81 MG tablet Take 81 mg by mouth daily.      . CRESTOR 10 MG tablet Take 10 mg by mouth Daily.      . diltiazem (CARDIZEM CD) 300 MG 24 hr capsule Take 300 mg by mouth Daily.      . levothyroxine (SYNTHROID, LEVOTHROID) 100 MCG tablet Take 100 mcg by mouth Daily.      . metoprolol succinate (TOPROL-XL) 50 MG 24 hr tablet Take 50 mg by mouth Daily.      . potassium chloride SA (K-DUR,KLOR-CON) 20 MEQ tablet Take 20 mEq by mouth Daily.      . triamterene-hydrochlorothiazide (MAXZIDE) 75-50 MG per tablet Take 75 mg by mouth Daily.         Past Medical History  Diagnosis Date  . HTN (hypertension)   . Hypercholesteremia   . GERD (gastroesophageal reflux disease)   . Supraventricular tachycardia     ROS:   All systems reviewed and negative except as noted in the HPI.   No past surgical history on file.   Family History  Problem Relation Age of Onset  . Heart attack Father 42     History   Social History  . Marital Status: Married    Spouse Name:  N/A    Number of Children: N/A  . Years of Education: N/A   Occupational History  . Not on file.   Social History Main Topics  . Smoking status: Former Smoker    Quit date: 08/02/1977  . Smokeless tobacco: Not on file  . Alcohol Use: No  . Drug Use: No  . Sexually Active: Not on file   Other Topics Concern  . Not on file   Social History Narrative  . No narrative on file     BP 138/80  Pulse 68  Ht 5' 3" (1.6 m)  Wt 85.911 kg (189 lb 6.4 oz)  BMI 33.55 kg/m2  Physical Exam:  Well appearing middle-aged woman, NAD HEENT: Unremarkable Neck:  No JVD, no thyromegally Lungs:  Clear with no wheezes, rales, or rhonchi. HEART:  Regular rate rhythm, no murmurs, no rubs, no clicks Abd:  soft, positive bowel sounds, no organomegally, no rebound, no guarding Ext:  2 plus pulses, no edema, no cyanosis, no clubbing Skin:  No rashes no nodules Neuro:  CN II through XII intact, motor grossly intact    Assess/Plan:   

## 2011-10-28 NOTE — Op Note (Signed)
DDD PPM inserted via the left subclavian vein. [id number].

## 2011-10-28 NOTE — Op Note (Signed)
NAMEGLADIS, Cheryl [name]                 ACCOUNT NO.:  [id number]  MEDICAL RECORD NO.:  [id number]  LOCATION:  3742                         FACILITY:  [facility]  PHYSICIAN:  Doylene Canning. Ladona Ridgel, MD    DATE OF BIRTH:  Jun 14, 1946  DATE OF PROCEDURE:  10/28/2011 DATE OF DISCHARGE:                              OPERATIVE REPORT   PROCEDURE PERFORMED:  Insertion of dual-chamber pacemaker.  INDICATION:  Symptomatic tachy-brady syndrome with paroxysmal AFib and post-termination pauses of over 3 seconds.  INTRODUCTION:  The patient is a 66 year old woman who has a history of paroxysmal AFib.  She has a history of symptomatic tachy-brady with near- syncope and documented pauses of greater than 3 seconds in the setting of AFib with a rapid ventricular response.  She is now referred for insertion of a dual-chamber pacemaker.  PROCEDURE:  After informed consent was obtained, the patient was taken to the diagnostic EP lab in a fasting state.  After usual preparation and draping, intravenous fentanyl and midazolam was given for sedation. 30 mL lidocaine was infiltrated into the left infraclavicular region.  A 5 cm incision was carried out over this region.  Electrocautery was utilized to dissect down in the fascial plane.  The left subclavian vein was punctured x2, and the St. Jude model 2088T, 52 cm active fixation pacing lead, serial number [id number] was advanced into the right ventricle and the St. Jude model 2088T, 46 cm active fixation pacing lead, serial number [id number] was advanced into the right atrium. Mapping was carried out initially in the right ventricle and at the final site, the R-waves measured 15 mV, the pacing impedance was 600 ohms, and threshold was 0.7 V at 0.5 msec, 10 V pacing did not stimulate the diaphragm.  With the ventricular lead in satisfactory position, attention was then turned to placing the atrial lead, was placed in the anterolateral portion of the right atrium where  P-waves measured 3 mV, and the pacing impedance with the lead actively fixed was 400 ohms.  The threshold was less than a V at 0.5 msec.  Again, 10 V pacing did not stimulate the diaphragm.  There was a large injury current with active fixation of both atrial and ventricular leads.  With these satisfactory parameters, the leads were secured to the subpectoral fascia with a figure-of-eight silk suture.  The sewing sleeve was secured with silk suture.  Electrocautery was utilized to make subcutaneous pocket. Antibiotic irrigation was utilized to irrigate the pocket and electrocautery was utilized to assure hemostasis.  The St. Jude dual- chamber pacemaker serial [id number] was connected to the atrial and RV leads and placed back in the subcutaneous pocket.  The generator was secured with silk suture.  The pocket was irrigated with additional antibiotic irrigation, and the incision was closed with 2-0 and 3-0 Vicryl.  Benzoin, Steri-Strips were painted on the skin, pressure dressing was applied, and the patient was returned to the room in satisfactory condition.  COMPLICATIONS:  There were no immediate procedure complications.  RESULTS:  This demonstrates successful implantation of a St. Jude dual- chamber pacemaker in a patient with symptomatic tachy-brady syndrome.     Doylene Canning. Ladona Ridgel, MD  GWT/MEDQ  D:  10/28/2011  T:  10/28/2011  Job:  [id number]

## 2011-10-28 NOTE — Progress Notes (Signed)
Dr. Ladona Ridgel aware that potassium level is 3.2

## 2011-10-28 NOTE — Interval H&P Note (Signed)
History and Physical Interval Note:  10/28/2011 7:00 AM  Cheryl [name]  has presented today for surgery, with the diagnosis of Huston Foley  The various methods of treatment have been discussed with the patient and family. After consideration of risks, benefits and other options for treatment, the patient has consented to  Procedure(s) (LRB): PERMANENT PACEMAKER INSERTION (N/A) as a surgical intervention .  The patients' history has been reviewed, patient examined, no change in status, stable for surgery.  I have reviewed the patients' chart and labs.  Questions were answered to the patient's satisfaction.     Lewayne Bunting

## 2011-10-29 ENCOUNTER — Encounter (HOSPITAL_COMMUNITY): Payer: Self-pay | Admitting: Nurse Practitioner

## 2011-10-29 ENCOUNTER — Other Ambulatory Visit: Payer: Self-pay

## 2011-10-29 ENCOUNTER — Ambulatory Visit (HOSPITAL_COMMUNITY): Payer: BC Managed Care – PPO

## 2011-10-29 DIAGNOSIS — I495 Sick sinus syndrome: Secondary | ICD-10-CM | POA: Insufficient documentation

## 2011-10-29 DIAGNOSIS — K219 Gastro-esophageal reflux disease without esophagitis: Secondary | ICD-10-CM | POA: Insufficient documentation

## 2011-10-29 DIAGNOSIS — I10 Essential (primary) hypertension: Secondary | ICD-10-CM

## 2011-10-29 DIAGNOSIS — E7800 Pure hypercholesterolemia, unspecified: Secondary | ICD-10-CM | POA: Insufficient documentation

## 2011-10-29 DIAGNOSIS — E039 Hypothyroidism, unspecified: Secondary | ICD-10-CM | POA: Insufficient documentation

## 2011-10-29 DIAGNOSIS — I4819 Other persistent atrial fibrillation: Secondary | ICD-10-CM | POA: Insufficient documentation

## 2011-10-29 DIAGNOSIS — I4891 Unspecified atrial fibrillation: Secondary | ICD-10-CM

## 2011-10-29 MED ORDER — FLECAINIDE ACETATE 100 MG PO TABS: 100.0000 mg | ORAL_TABLET | Freq: Two times a day (BID) | ORAL | Status: DC

## 2011-10-29 NOTE — Discharge Summary (Signed)
Patient ID: Cheryl [name],  MRN: [id number], DOB/AGE: Oct 24, 1945 66 y.o.  Admit date: 10/28/2011 Discharge date: 10/29/2011  Primary Care Provider: Kaleen Mask, MD Primary Cardiologist: Lovina Reach, MD/G. Ladona Ridgel, MD  Discharge Diagnoses Principal Problem:  *Tachy-brady syndrome Active Problems:  PAF (paroxysmal atrial fibrillation)  Hypertension  Hypercholesteremia  Hypothyroidism   Allergies Allergies  Allergen Reactions  . Codeine     nausea    Procedures  10/28/2011 Permanent Pacemaker Placement  St. Jude dual-chamber pacemaker serial #[id number]   History of Present Illness  66 y/o female with the above problem list.  She has symptomatic tachy-brady syndrome and previously was not felt to be a good tikosyn candidate secondary to issues with noncompliance.  As such, decision was made to pursue PPM placement secondary to >3 second post-termination pauses with a plan to initiate Flecainide during hospitalization.  Hospital Course  Pt presented to the [facility] EP Lab on 3/28 and underwent successful placement of a St. Jude dual chamber permanent pacemaker.  She tolerated this procedure well and post-procedure was placed on Flecainide therapy.  She has tolerated this well and has had no atrial fibrillation during this admission.  Her post-procedure chest x-ray has shown no evidence of PTX.  She will be discharged home today in good condition.  Discharge Vitals Blood pressure 124/81, pulse 60, temperature 97.4 F (36.3 C), temperature source Oral, resp. rate 18, height 5\' 3"  (1.6 m), weight 194 lb 14.4 oz (88.406 kg), SpO2 98.00%.  Filed Weights   10/28/11 0542 10/29/11 0500  Weight: 184 lb (83.462 kg) 194 lb 14.4 oz (88.406 kg)    Labs  CBC No results found for this basename: WBC:2,NEUTROABS:2,HGB:2,HCT:2,MCV:2,PLT:2 in the last 72 hours Basic Metabolic Panel  Basename 10/28/11 0550  NA 140  K 3.2*  CL 100  CO2 27  GLUCOSE 98  BUN 14  CREATININE 1.07  CALCIUM 9.7   MG --  PHOS --   Disposition  Pt is being discharged home today in good condition.  Follow-up Plans & Appointments  Follow-up Information    Follow up with Dietrich Pates, MD. (As scheduled)    Contact information:   [address] [place] [address] [phone]       Follow up with Lewayne Bunting, MD in 3 months. (We will arrange)    Contact information:   [address] [place] [address] [phone]       Follow up with [facility] in 10 days. (We will arrange)    Contact information:   [address] [place] [address] [phone]         Discharge Medications  Medication List  As of 10/29/2011  1:15 PM   STOP taking these medications         diltiazem 300 MG 24 hr capsule         TAKE these medications         aspirin 81 MG tablet   Take 81 mg by mouth daily.      CRESTOR 10 MG tablet   Generic drug: rosuvastatin   Take 10 mg by mouth Daily.      flecainide 100 MG tablet   Commonly known as: TAMBOCOR   Take 1 tablet (100 mg total) by mouth every 12 (twelve) hours.      levothyroxine 100 MCG tablet   Commonly known as: SYNTHROID, LEVOTHROID   Take 100 mcg by mouth Daily.  metoprolol succinate 50 MG 24 hr tablet   Commonly known as: TOPROL-XL   Take 50 mg by mouth Daily.      potassium chloride SA 20 MEQ tablet   Commonly known as: K-DUR,KLOR-CON   Take 1 tablet (20 mEq total) by mouth 2 (two) times daily.      triamterene-hydrochlorothiazide 75-50 MG per tablet   Commonly known as: MAXZIDE   Take 75 mg by mouth Daily.            Outstanding Labs/Studies  None  Duration of Discharge Encounter   Greater than 30 minutes including physician time.  Signed, Nicolasa Ducking NP 10/29/2011, 1:15 PM

## 2011-10-29 NOTE — Progress Notes (Signed)
Subjective: No CP  NO SOB> Objective: Filed Vitals:   10/28/11 1102 10/28/11 2100 10/29/11 0500 10/29/11 0950  BP: 130/63 127/73 122/73 124/81  Pulse: 65 68 61 60  Temp:  97.8 F (36.6 C) 97.4 F (36.3 C)   TempSrc:  Oral Oral   Resp:  18 18   Height:      Weight:   194 lb 14.4 oz (88.406 kg)   SpO2:  97% 98%    Weight change: 10 lb 14.4 oz (4.944 kg) No intake or output data in the 24 hours ending 10/29/11 1055  General: Alert, awake, oriented x3, in no acute distress Neck:  JVP is normal Heart: Regular rate and rhythm, without murmurs, rubs, gallops.  Lungs: Clear to auscultation.  No rales or wheezes. Chest:  Dressing.dry. Exemities:  No edema.   Neuro: Grossly intact, nonfocal.  Tele: Paced    Lab Results: No results found for this or any previous visit (from the past 24 hour(s)).  Studies/Results: Dg Chest 2 View  10/29/2011  *RADIOLOGY REPORT*  Clinical Data: Pacemaker placement.  CHEST - 2 VIEW  Comparison: None.  Findings: Left subclavian pacemaker leads are present within the right atrium and right ventricle.  Heart size and mediastinal contours are normal.  The lungs are clear and there is no pleural effusion or pneumothorax.  The osseous structures appear normal.  IMPRESSION: Pacemaker placement as described.  No complication or acute process demonstrated.  Original Report Authenticated By: Gerrianne Scale, M.D.    Medications: I have reviewed the patient's current medications.   Active Problems:  Atrial fibrillation  Keep on flecainide and metoprolol and ASA>  Bradycardia  S/p PPM  CXR OK  Plan d/c today with f/u in wound clnic/pacer clinic.  Hypertension  Adequate control     LOS: 1 day   Dietrich Pates 10/29/2011, 10:55 AM

## 2011-10-29 NOTE — Progress Notes (Signed)
DC orders received.  Patient stable with no S/S of distress.  Medication and discharge instructions reviewed with patient and family. Patient DC home with husband. Cheryl [name]

## 2011-10-29 NOTE — Discharge Instructions (Signed)
***  PLEASE REMEMBER TO BRING ALL OF YOUR MEDICATIONS TO EACH OF YOUR FOLLOW-UP OFFICE VISITS.    Supplemental Discharge Instructions for  Pacemaker/Defibrillator Patients  Activity No heavy lifting or vigorous activity with your left/right arm for 6 to 8 weeks.  Do not raise your left/right arm above your head for one week.  Gradually raise your affected arm as drawn below.                         4/2                /         4/3                /          4/4             /            4/5          NO DRIVING for      ; you may begin driving on   4/5    . WOUND CARE   Keep the wound area clean and dry.  Do not get this area wet for one week. No showers for one week; you may shower on      4/5     .   The tape/steri-strips on your wound will fall off; do not pull them off.  No bandage is needed on the site.  DO  NOT apply any creams, oils, or ointments to the wound area.   If you notice any drainage or discharge from the wound, any swelling or bruising at the site, or you develop a fever > 101? F after you are discharged home, call the office at once.  Special Instructions   You are still able to use cellular telephones; use the ear opposite the side where you have your pacemaker/defibrillator.  Avoid carrying your cellular phone near your device.   When traveling through airports, show security personnel your identification card to avoid being screened in the metal detectors.  Ask the security personnel to use the hand wand.   Avoid arc welding equipment, MRI testing (magnetic resonance imaging), TENS units (transcutaneous nerve stimulators).  Call the office for questions about other devices.   Avoid electrical appliances that are in poor condition or are not properly grounded.   Microwave ovens are safe to be near or to operate.

## 2011-11-02 ENCOUNTER — Telehealth: Payer: Self-pay | Admitting: Internal Medicine

## 2011-11-02 NOTE — Telephone Encounter (Signed)
PT CALLING RE FU FROM HOSPITAL, SHE THOUGHT SHE WAS TO SEE ROSS IN A FEW DAYS AFTER IMPLANT OF DEVICE AND WAS CALLING TO SEE WHEN HER APPT WAS, I TOLD HER THE PROTOCOL WAS 10-14 DAYS WITH DEVICE AND 91 DAYS WITH THE PROVIDER, SHE DIDN'T SEEM TO THINK THIS WAS RIGHT AND REQUESTED A NURSE CALL

## 2011-11-02 NOTE — Telephone Encounter (Signed)
Pt needs an appointment to follow up in the device clinic in 10 to 14 days, she needs a 3 month f/u appt with Dr Ladona Ridgel and will need to see Dr Eliott Nine back as directed.  She does not need to see Dr Tenny Craw in follow up.  Pt states understanding and was transferred to the schedules for appts.

## 2011-11-03 ENCOUNTER — Telehealth: Payer: Self-pay | Admitting: Internal Medicine

## 2011-11-03 NOTE — Telephone Encounter (Signed)
Pt feels sick to her stomach since she came home from having prodedure done, is this normal?

## 2011-11-03 NOTE — Telephone Encounter (Signed)
Spoke with patient and she has been feeling nauseated since coming home from hospital  Some days worse than others.  She was started on Flecainide 100mg  bid.  She does have some reflux and has not been taking anuthing for that.  She is going to try some OTC Zantac and call me back if this does not help.  I let he ]r know we may have to decrease her medication but will have to discuss with Dr Ladona Ridgel first

## 2011-11-05 ENCOUNTER — Telehealth: Payer: Self-pay | Admitting: Internal Medicine

## 2011-11-05 NOTE — Telephone Encounter (Signed)
She thought the Pacemaker would keep her heart in rhythm.  I let her know that it does not keep her in rhythm but allows [place] to give her medications to controll the afib

## 2011-11-05 NOTE — Telephone Encounter (Signed)
Pt calling re question about procedure she had

## 2011-11-11 ENCOUNTER — Encounter: Payer: Self-pay | Admitting: Internal Medicine

## 2011-11-11 ENCOUNTER — Ambulatory Visit (INDEPENDENT_AMBULATORY_CARE_PROVIDER_SITE_OTHER): Payer: BC Managed Care – PPO | Admitting: *Deleted

## 2011-11-11 DIAGNOSIS — I495 Sick sinus syndrome: Secondary | ICD-10-CM

## 2011-11-11 DIAGNOSIS — I4891 Unspecified atrial fibrillation: Secondary | ICD-10-CM

## 2011-11-11 DIAGNOSIS — I480 Paroxysmal atrial fibrillation: Secondary | ICD-10-CM

## 2011-11-11 LAB — PACEMAKER DEVICE OBSERVATION
AL AMPLITUDE: 4.1 mv
AL IMPEDENCE PM: 450 Ohm
ATRIAL PACING PM: 71
BATTERY VOLTAGE: 3.2186 V
RV LEAD IMPEDENCE PM: 587.5 Ohm
VENTRICULAR PACING PM: 1

## 2011-11-11 NOTE — Progress Notes (Signed)
Wound check-PPM 

## 2011-11-16 NOTE — Telephone Encounter (Signed)
Try the OTC Zantac first  If this does not help will decrease Flecainide to 50mg  bid per Dr Ladona Ridgel.  Called patient and let her know Dr Lubertha Basque recommendations.  Overall she is feeling better

## 2011-12-24 ENCOUNTER — Telehealth: Payer: Self-pay | Admitting: Internal Medicine

## 2011-12-24 NOTE — Telephone Encounter (Signed)
Left message with family, transmitter ordered.  She has an in office visit 01/28/12.

## 2011-12-24 NOTE — Telephone Encounter (Signed)
Or [phone] , pt calling re ordering monitor, she has not seen it, pls call

## 2011-12-28 ENCOUNTER — Telehealth: Payer: Self-pay | Admitting: Internal Medicine

## 2011-12-28 NOTE — Telephone Encounter (Signed)
Please return call to patient regarding medication change, she can be reached at [phone].

## 2011-12-28 NOTE — Telephone Encounter (Signed)
lmom for patient to call me back. 

## 2011-12-29 ENCOUNTER — Telehealth: Payer: Self-pay | Admitting: Internal Medicine

## 2011-12-29 NOTE — Telephone Encounter (Signed)
ERROR

## 2012-01-03 NOTE — Telephone Encounter (Signed)
Left message for patient to call me back. 

## 2012-01-04 NOTE — Telephone Encounter (Signed)
Spoke with husband.  He will have her call me back.

## 2012-01-05 NOTE — Telephone Encounter (Signed)
Patient returning nurse call, she can be reached at [phone].

## 2012-01-10 NOTE — Telephone Encounter (Signed)
Will forward to [place], I can't determine what pt is calling about.

## 2012-01-13 NOTE — Telephone Encounter (Signed)
lmom for pt to return my call. 

## 2012-01-18 ENCOUNTER — Telehealth: Payer: Self-pay | Admitting: Internal Medicine

## 2012-01-18 NOTE — Telephone Encounter (Signed)
Called pt back  She got off track by an hour and a half on the Flecainide and let her know it will e fine to take it at the normal time tonight.  She just wanted to make sure

## 2012-01-18 NOTE — Telephone Encounter (Signed)
New msg Pt said she didn't take her dose of flecainide. She wanted to talk to you. Please call

## 2012-01-26 NOTE — Telephone Encounter (Signed)
lmom for daughter that I had discussed with Dr Ladona Ridgel and he felt her mom would be ok to proceed with surgery if necessary.

## 2012-01-28 ENCOUNTER — Encounter: Payer: Self-pay | Admitting: Internal Medicine

## 2012-01-28 ENCOUNTER — Ambulatory Visit (INDEPENDENT_AMBULATORY_CARE_PROVIDER_SITE_OTHER): Payer: BC Managed Care – PPO | Admitting: Internal Medicine

## 2012-01-28 VITALS — BP 164/82 | HR 60 | Ht 63.0 in | Wt 187.8 lb

## 2012-01-28 DIAGNOSIS — I10 Essential (primary) hypertension: Secondary | ICD-10-CM

## 2012-01-28 DIAGNOSIS — Z950 Presence of cardiac pacemaker: Secondary | ICD-10-CM | POA: Insufficient documentation

## 2012-01-28 DIAGNOSIS — I495 Sick sinus syndrome: Secondary | ICD-10-CM

## 2012-01-28 DIAGNOSIS — I480 Paroxysmal atrial fibrillation: Secondary | ICD-10-CM

## 2012-01-28 DIAGNOSIS — I4891 Unspecified atrial fibrillation: Secondary | ICD-10-CM

## 2012-01-28 LAB — PACEMAKER DEVICE OBSERVATION
AL THRESHOLD: 1.375 V
ATRIAL PACING PM: 97
BAMS-0001: 170 {beats}/min
DEVICE MODEL PM: 7334985
RV LEAD THRESHOLD: 1 V
VENTRICULAR PACING PM: 7.6

## 2012-01-28 MED ORDER — CARVEDILOL 12.5 MG PO TABS: 12.5000 mg | ORAL_TABLET | Freq: Two times a day (BID) | ORAL | Status: DC

## 2012-01-28 NOTE — Assessment & Plan Note (Signed)
She is maintaining normal sinus rhythm 99% of the time. We'll continue her flecainide.

## 2012-01-28 NOTE — Progress Notes (Signed)
HPI Cheryl [name] returns today for followup. She is a very pleasant 66 year old woman with a history of symptomatic tachybradycardia syndrome, hypertension, status post permanent pacemaker insertion. She has had some fatigue and weakness. Her blood pressure has not been well-controlled. She denies chest pain or shortness of breath or peripheral edema. No syncope. She has minimal palpitations. Allergies  Allergen Reactions  . Codeine     nausea     Current Outpatient Prescriptions  Medication Sig Dispense Refill  . aspirin 81 MG tablet Take 81 mg by mouth daily.      . CRESTOR 10 MG tablet Take 10 mg by mouth Daily.      . flecainide (TAMBOCOR) 100 MG tablet Take 1 tablet (100 mg total) by mouth every 12 (twelve) hours.  60 tablet  6  . levothyroxine (SYNTHROID, LEVOTHROID) 100 MCG tablet Take 100 mcg by mouth Daily.      . metoprolol succinate (TOPROL-XL) 50 MG 24 hr tablet Take 50 mg by mouth Daily.      . potassium chloride SA (K-DUR,KLOR-CON) 20 MEQ tablet Take 1 tablet (20 mEq total) by mouth 2 (two) times daily.  60 tablet  6  . ranitidine (ZANTAC) 75 MG tablet Take 75 mg by mouth daily.      Marland Kitchen triamterene-hydrochlorothiazide (MAXZIDE) 75-50 MG per tablet Take 75 mg by mouth Daily.         Past Medical History  Diagnosis Date  . HTN (hypertension)   . Hypercholesteremia   . GERD (gastroesophageal reflux disease)   . Hypothyroidism   . Anemia   . Blood transfusion     " no reaction "  . H/O hiatal hernia   . Arthritis   . PAF (paroxysmal atrial fibrillation)   . Tachy-brady syndrome     a. post-termination pauses in setting of PAF;  b. 10/28/11 - St. Jude Dual Chamber PPM SN [id number]     ROS:   All systems reviewed and negative except as noted in the HPI.   Past Surgical History  Procedure Date  . Icd 10/28/2011  . Breast surgery      Family History  Problem Relation Age of Onset  . Heart attack Father 31     History   Social History  . Marital Status:  Married    Spouse Name: N/A    Number of Children: N/A  . Years of Education: N/A   Occupational History  . Not on file.   Social History Main Topics  . Smoking status: Former Smoker    Quit date: 08/02/1977  . Smokeless tobacco: Never Used  . Alcohol Use: No  . Drug Use: No  . Sexually Active: Not Currently    Birth Control/ Protection: Post-menopausal   Other Topics Concern  . Not on file   Social History Narrative  . No narrative on file     BP 164/82  Pulse 60  Ht 5\' 3"  (1.6 m)  Wt 187 lb 12.8 oz (85.186 kg)  BMI 33.27 kg/m2  Physical Exam:  Well appearing middle-aged woman, NAD HEENT: Unremarkable Neck:  No JVD, no thyromegally Lungs:  Clear with no wheezes, rales, or rhonchi. HEART:  Regular rate rhythm, no murmurs, no rubs, no clicks Abd:  soft, positive bowel sounds, no organomegally, no rebound, no guarding Ext:  2 plus pulses, no edema, no cyanosis, no clubbing Skin:  No rashes no nodules Neuro:  CN II through XII intact, motor grossly intact  DEVICE  Normal device function.  See PaceArt for details.   Assess/Plan:

## 2012-01-28 NOTE — Patient Instructions (Addendum)
Your physician wants you to follow-up in: March 2013 You will receive a reminder letter in the mail two months in advance. If you don't receive a letter, please call our office to schedule the follow-up appointment.   Your physician has recommended you make the following change in your medication:  1) Stop Metoprolol 2) Start Carvedilol 12.5mg  twice daily

## 2012-01-28 NOTE — Assessment & Plan Note (Signed)
Her device is working normally. We'll plan to recheck in several months. 

## 2012-01-28 NOTE — Assessment & Plan Note (Signed)
Her blood pressure is not well controlled. She has associated fatigue. I've asked the patient to stop Toprol and start carvedilol. She will followup with Dr. Mayford Knife.

## 2012-03-02 DIAGNOSIS — I10 Essential (primary) hypertension: Secondary | ICD-10-CM | POA: Diagnosis not present

## 2012-03-02 DIAGNOSIS — E785 Hyperlipidemia, unspecified: Secondary | ICD-10-CM | POA: Diagnosis not present

## 2012-03-02 DIAGNOSIS — E039 Hypothyroidism, unspecified: Secondary | ICD-10-CM | POA: Diagnosis not present

## 2012-03-02 DIAGNOSIS — K219 Gastro-esophageal reflux disease without esophagitis: Secondary | ICD-10-CM | POA: Diagnosis not present

## 2012-03-20 ENCOUNTER — Telehealth: Payer: Self-pay | Admitting: Internal Medicine

## 2012-03-20 NOTE — Telephone Encounter (Signed)
New Problem:    Patient called in today because she lifted something that was heavier than she anticipated and now when she lifts her left arm she experiences dizziness.  Patient wanted to know if she might have done something to her device.  Please call back.

## 2012-03-20 NOTE — Telephone Encounter (Signed)
Pt scheduled for pacer check 03-21-12 @ 900am. Pt aware.

## 2012-03-21 ENCOUNTER — Encounter: Payer: Self-pay | Admitting: Internal Medicine

## 2012-03-21 ENCOUNTER — Ambulatory Visit (INDEPENDENT_AMBULATORY_CARE_PROVIDER_SITE_OTHER): Payer: Medicare Other | Admitting: *Deleted

## 2012-03-21 DIAGNOSIS — I495 Sick sinus syndrome: Secondary | ICD-10-CM

## 2012-03-21 LAB — PACEMAKER DEVICE OBSERVATION
AL AMPLITUDE: 3.6 mv
AL IMPEDENCE PM: 512.5 Ohm
AL THRESHOLD: 1.5 V
ATRIAL PACING PM: 98
DEVICE MODEL PM: 7334985
RV LEAD IMPEDENCE PM: 537.5 Ohm
RV LEAD THRESHOLD: 1.125 V
VENTRICULAR PACING PM: 19

## 2012-03-21 NOTE — Progress Notes (Signed)
Pacer check in clinic  

## 2012-03-23 DIAGNOSIS — E785 Hyperlipidemia, unspecified: Secondary | ICD-10-CM | POA: Diagnosis not present

## 2012-03-23 DIAGNOSIS — K219 Gastro-esophageal reflux disease without esophagitis: Secondary | ICD-10-CM | POA: Diagnosis not present

## 2012-03-25 DIAGNOSIS — R1319 Other dysphagia: Secondary | ICD-10-CM | POA: Diagnosis not present

## 2012-03-25 DIAGNOSIS — E7800 Pure hypercholesterolemia, unspecified: Secondary | ICD-10-CM | POA: Diagnosis not present

## 2012-03-25 DIAGNOSIS — J029 Acute pharyngitis, unspecified: Secondary | ICD-10-CM | POA: Diagnosis not present

## 2012-03-25 DIAGNOSIS — I10 Essential (primary) hypertension: Secondary | ICD-10-CM | POA: Diagnosis not present

## 2012-03-25 DIAGNOSIS — Z79899 Other long term (current) drug therapy: Secondary | ICD-10-CM | POA: Diagnosis not present

## 2012-03-27 DIAGNOSIS — I495 Sick sinus syndrome: Secondary | ICD-10-CM | POA: Diagnosis not present

## 2012-03-27 DIAGNOSIS — I4891 Unspecified atrial fibrillation: Secondary | ICD-10-CM | POA: Diagnosis not present

## 2012-03-27 DIAGNOSIS — Z950 Presence of cardiac pacemaker: Secondary | ICD-10-CM | POA: Diagnosis not present

## 2012-03-27 DIAGNOSIS — I10 Essential (primary) hypertension: Secondary | ICD-10-CM | POA: Diagnosis not present

## 2012-04-02 ENCOUNTER — Emergency Department (HOSPITAL_COMMUNITY): Payer: Medicare Other

## 2012-04-02 ENCOUNTER — Inpatient Hospital Stay (HOSPITAL_COMMUNITY)
Admission: EM | Admit: 2012-04-02 | Discharge: 2012-04-05 | DRG: 251 | Disposition: A | Discharge status: Home / Self Care | Payer: Medicare Other | Attending: Cardiology | Admitting: Cardiology

## 2012-04-02 DIAGNOSIS — R072 Precordial pain: Secondary | ICD-10-CM | POA: Diagnosis not present

## 2012-04-02 DIAGNOSIS — I10 Essential (primary) hypertension: Secondary | ICD-10-CM | POA: Diagnosis present

## 2012-04-02 DIAGNOSIS — R32 Unspecified urinary incontinence: Secondary | ICD-10-CM | POA: Diagnosis present

## 2012-04-02 DIAGNOSIS — Z87891 Personal history of nicotine dependence: Secondary | ICD-10-CM

## 2012-04-02 DIAGNOSIS — Z955 Presence of coronary angioplasty implant and graft: Secondary | ICD-10-CM

## 2012-04-02 DIAGNOSIS — E876 Hypokalemia: Secondary | ICD-10-CM

## 2012-04-02 DIAGNOSIS — I495 Sick sinus syndrome: Secondary | ICD-10-CM | POA: Diagnosis present

## 2012-04-02 DIAGNOSIS — Z6833 Body mass index (BMI) 33.0-33.9, adult: Secondary | ICD-10-CM

## 2012-04-02 DIAGNOSIS — E7800 Pure hypercholesterolemia, unspecified: Secondary | ICD-10-CM | POA: Diagnosis present

## 2012-04-02 DIAGNOSIS — E785 Hyperlipidemia, unspecified: Secondary | ICD-10-CM | POA: Diagnosis present

## 2012-04-02 DIAGNOSIS — F419 Anxiety disorder, unspecified: Secondary | ICD-10-CM | POA: Diagnosis present

## 2012-04-02 DIAGNOSIS — Z79899 Other long term (current) drug therapy: Secondary | ICD-10-CM

## 2012-04-02 DIAGNOSIS — E039 Hypothyroidism, unspecified: Secondary | ICD-10-CM | POA: Diagnosis present

## 2012-04-02 DIAGNOSIS — Z7982 Long term (current) use of aspirin: Secondary | ICD-10-CM

## 2012-04-02 DIAGNOSIS — I6789 Other cerebrovascular disease: Secondary | ICD-10-CM | POA: Diagnosis not present

## 2012-04-02 DIAGNOSIS — Z950 Presence of cardiac pacemaker: Secondary | ICD-10-CM | POA: Diagnosis present

## 2012-04-02 DIAGNOSIS — I4891 Unspecified atrial fibrillation: Secondary | ICD-10-CM | POA: Diagnosis present

## 2012-04-02 DIAGNOSIS — I214 Non-ST elevation (NSTEMI) myocardial infarction: Principal | ICD-10-CM | POA: Diagnosis present

## 2012-04-02 DIAGNOSIS — R5383 Other fatigue: Secondary | ICD-10-CM | POA: Diagnosis not present

## 2012-04-02 DIAGNOSIS — I2510 Atherosclerotic heart disease of native coronary artery without angina pectoris: Secondary | ICD-10-CM | POA: Diagnosis present

## 2012-04-02 DIAGNOSIS — R5381 Other malaise: Secondary | ICD-10-CM | POA: Diagnosis not present

## 2012-04-02 DIAGNOSIS — R0602 Shortness of breath: Secondary | ICD-10-CM | POA: Diagnosis not present

## 2012-04-02 DIAGNOSIS — K449 Diaphragmatic hernia without obstruction or gangrene: Secondary | ICD-10-CM | POA: Diagnosis present

## 2012-04-02 DIAGNOSIS — K219 Gastro-esophageal reflux disease without esophagitis: Secondary | ICD-10-CM | POA: Diagnosis present

## 2012-04-02 DIAGNOSIS — F411 Generalized anxiety disorder: Secondary | ICD-10-CM | POA: Diagnosis present

## 2012-04-02 DIAGNOSIS — Z7902 Long term (current) use of antithrombotics/antiplatelets: Secondary | ICD-10-CM

## 2012-04-02 DIAGNOSIS — E669 Obesity, unspecified: Secondary | ICD-10-CM | POA: Diagnosis present

## 2012-04-02 LAB — CBC WITH DIFFERENTIAL/PLATELET
Eosinophils Absolute: 0.4 10*3/uL (ref 0.0–0.7)
Eosinophils Relative: 4 % (ref 0–5)
HCT: 41 % (ref 36.0–46.0)
Lymphocytes Relative: 36 % (ref 12–46)
Lymphs Abs: 3.2 10*3/uL (ref 0.7–4.0)
MCH: 28.9 pg (ref 26.0–34.0)
MCV: 81.8 fL (ref 78.0–100.0)
Monocytes Absolute: 0.9 10*3/uL (ref 0.1–1.0)
Platelets: 277 10*3/uL (ref 150–400)
RBC: 5.01 MIL/uL (ref 3.87–5.11)
WBC: 8.9 10*3/uL (ref 4.0–10.5)

## 2012-04-02 LAB — POCT I-STAT TROPONIN I: Troponin i, poc: 0.19 ng/mL (ref 0.00–0.08)

## 2012-04-02 LAB — POCT I-STAT, CHEM 8
BUN: 12 mg/dL (ref 6–23)
Chloride: 101 mEq/L (ref 96–112)
Sodium: 142 mEq/L (ref 135–145)

## 2012-04-02 MED ORDER — HEPARIN BOLUS VIA INFUSION
4000.0000 [IU] | Freq: Once | INTRAVENOUS | Status: AC
  Administered 2012-04-03: 4000 [IU] via INTRAVENOUS

## 2012-04-02 MED ORDER — POTASSIUM CHLORIDE 10 MEQ/100ML IV SOLN
10.0000 meq | INTRAVENOUS | Status: AC
  Administered 2012-04-02: 10 meq via INTRAVENOUS
  Filled 2012-04-02: qty 100

## 2012-04-02 MED ORDER — POTASSIUM CHLORIDE CRYS ER 20 MEQ PO TBCR
40.0000 meq | EXTENDED_RELEASE_TABLET | Freq: Once | ORAL | Status: AC
  Administered 2012-04-02: 40 meq via ORAL
  Filled 2012-04-02: qty 2

## 2012-04-02 MED ORDER — HEPARIN (PORCINE) IN NACL 100-0.45 UNIT/ML-% IJ SOLN
900.0000 [IU]/h | INTRAMUSCULAR | Status: DC
  Administered 2012-04-03: 1000 [IU]/h via INTRAVENOUS
  Administered 2012-04-03: 1150 [IU]/h via INTRAVENOUS
  Filled 2012-04-02 (×3): qty 250

## 2012-04-02 MED ORDER — ASPIRIN 81 MG PO CHEW
324.0000 mg | CHEWABLE_TABLET | Freq: Once | ORAL | Status: AC
  Administered 2012-04-02: 324 mg via ORAL
  Filled 2012-04-02: qty 4

## 2012-04-02 MED ORDER — SODIUM CHLORIDE 0.9 % IV BOLUS (SEPSIS)
1000.0000 mL | Freq: Once | INTRAVENOUS | Status: AC
  Administered 2012-04-02: 1000 mL via INTRAVENOUS

## 2012-04-02 NOTE — ED Notes (Signed)
EDP at [facility] 

## 2012-04-02 NOTE — ED Notes (Signed)
Alert, NAD,calm, interactive, skin W&D, resps e/u, speaking in clear complete sentences, reports mild numbness in all extremeties, denies other sx, family at [facility] x2, xray here for pt, to xray.

## 2012-04-02 NOTE — ED Provider Notes (Signed)
History     CSN: [id number]  Arrival date & time 04/02/12  2253   First MD Initiated Contact with Patient 04/02/12 2258      No chief complaint on file.   (Consider location/radiation/quality/duration/timing/severity/associated sxs/prior treatment) HPI Pt with history of tachy-brady syndrome who is approximately 6 months s/p placement of St. Jude Pacemaker (not AICD), who reports about prior to arrival she had a brief episode of L neck aching and L ear fullness which resolved quickly. About an hour later she reports she felt like she was jittery and shaking and generally weak. Arms and legs felt heavy but she was able to walk. She states she felt SOB during that time period, but did not have any CP. She called EMS and is feeling better now. Denies any missed medication doses. Was recently changed from metoprolol to carvedilol. She had her pacemaker checked about 2 weeks ago and it was functioning normally.   Past Medical History  Diagnosis Date  . HTN (hypertension)   . Hypercholesteremia   . GERD (gastroesophageal reflux disease)   . Hypothyroidism   . Anemia   . Blood transfusion     " no reaction "  . H/O hiatal hernia   . Arthritis   . PAF (paroxysmal atrial fibrillation)   . Tachy-brady syndrome     a. post-termination pauses in setting of PAF;  b. 10/28/11 - [address] Jude Dual Chamber PPM SN [id number]     Past Surgical History  Procedure Date  . Icd 10/28/2011  . Breast surgery     Family History  Problem Relation Age of Onset  . Heart attack Father 53    History  Substance Use Topics  . Smoking status: Former Smoker    Quit date: 08/02/1977  . Smokeless tobacco: Never Used  . Alcohol Use: No    OB History    Grav Para Term Preterm Abortions TAB SAB Ect Mult Living                  Review of Systems All other systems reviewed and are negative except as noted in HPI.   Allergies  Codeine and Tape  Home Medications   Current Outpatient Rx  Name Route  Sig Dispense Refill  . ASPIRIN 81 MG PO TABS Oral Take 81 mg by mouth daily.    Marland Kitchen CARVEDILOL 12.5 MG PO TABS Oral Take 1 tablet (12.5 mg total) by mouth 2 (two) times daily. 60 tablet 11  . CRESTOR 10 MG PO TABS Oral Take 10 mg by mouth Daily.    Marland Kitchen FLECAINIDE ACETATE 100 MG PO TABS Oral Take 1 tablet (100 mg total) by mouth every 12 (twelve) hours. 60 tablet 6  . LEVOTHYROXINE SODIUM 100 MCG PO TABS Oral Take 100 mcg by mouth Daily.    Marland Kitchen POTASSIUM CHLORIDE CRYS ER 20 MEQ PO TBCR Oral Take 1 tablet (20 mEq total) by mouth 2 (two) times daily. 60 tablet 6  . RANITIDINE HCL 75 MG PO TABS Oral Take 75 mg by mouth daily.    . TRIAMTERENE-HCTZ 75-50 MG PO TABS Oral Take 75 mg by mouth Daily.      BP 149/99  Temp 98.6 F (37 C) (Oral)  Resp 18  SpO2 97%  Physical Exam  Nursing note and vitals reviewed. Constitutional: She is oriented to person, place, and time. She appears well-developed and well-nourished.  HENT:  Head: Normocephalic and atraumatic.  Eyes: EOM are normal. Pupils are equal, round, and  reactive to light.  Neck: Normal range of motion. Neck supple.  Cardiovascular: Normal rate, normal heart sounds and intact distal pulses.   Pulmonary/Chest: Effort normal and breath sounds normal.  Abdominal: Bowel sounds are normal. She exhibits no distension. There is no tenderness.  Musculoskeletal: Normal range of motion. She exhibits no edema and no tenderness.  Neurological: She is alert and oriented to person, place, and time. She has normal strength. No cranial nerve deficit or sensory deficit.  Skin: Skin is warm and dry. No rash noted.  Psychiatric: She has a normal mood and affect.    ED Course  Procedures (including critical care time)  Labs Reviewed  POCT I-STAT, CHEM 8 - Abnormal; Notable for the following:    Potassium 2.7 (*)     Creatinine, Ser 1.20 (*)     Glucose, Bld 109 (*)     Calcium, Ion 1.12 (*)     All other components within normal limits  POCT I-STAT  TROPONIN I - Abnormal; Notable for the following:    Troponin i, poc 0.19 (*)     All other components within normal limits  CBC WITH DIFFERENTIAL  URINALYSIS, ROUTINE W REFLEX MICROSCOPIC   Dg Chest 2 View  04/02/2012  *RADIOLOGY REPORT*  Clinical Data: Worsening shortness of breath.  Weakness.  CHEST - 2 VIEW  Comparison: 10/29/2011.  Findings: Left subclavian pacemaker leads are unchanged within the right atrium and right ventricle.  Heart size and mediastinal contours are stable.  There is mild central airway thickening without confluent airspace opacity, edema or significant pleural effusion.  Osseous structures appear unchanged.  Telemetry leads overlie the chest.  IMPRESSION: Mild central airway thickening suggesting bronchitis.  No evidence of pneumonia or edema.   Original Report Authenticated By: Gerrianne Scale, M.D.      No diagnosis found.    MDM   Date: 04/02/2012  Rate: 83  Rhythm: normal sinus rhythm  QRS Axis: normal  Intervals: normal  ST/T Wave abnormalities: nonspecific ST/T changes  Conduction Disutrbances:nonspecific intraventricular conduction delay  Narrative Interpretation:   Old EKG Reviewed: changes noted, previously paced   T wave changes new from prior. POC Trop is positive. K is low. Started K repletion, heparin drip. Discussed with Dr. Antoine Poche on call for patient's primary cardiologist (Dr. Mayford Knife).        Akia Desroches B. Bernette Mayers, MD 04/03/12 [phone]

## 2012-04-02 NOTE — ED Notes (Signed)
Here from home by EMS, here for c/o general numbness, tingling and weakness, also confusion, and difficulty forming thoughts, sudden onset around 2200. Also mentioned abd pain. All sx resolved prior to EMS arrival. No sx present for EMS or on arrival. BP noted to be high for EMS, 178/148. No h/o TIA or neuro problems. Has a cardiac hx, demand pacer not pacing at this time. IV in place. Alert, NAD, calm, interactive.

## 2012-04-03 ENCOUNTER — Encounter (HOSPITAL_COMMUNITY): Payer: Self-pay | Admitting: Cardiology

## 2012-04-03 DIAGNOSIS — E785 Hyperlipidemia, unspecified: Secondary | ICD-10-CM | POA: Diagnosis not present

## 2012-04-03 DIAGNOSIS — M542 Cervicalgia: Secondary | ICD-10-CM | POA: Diagnosis not present

## 2012-04-03 DIAGNOSIS — F419 Anxiety disorder, unspecified: Secondary | ICD-10-CM | POA: Diagnosis present

## 2012-04-03 DIAGNOSIS — I10 Essential (primary) hypertension: Secondary | ICD-10-CM

## 2012-04-03 DIAGNOSIS — E876 Hypokalemia: Secondary | ICD-10-CM | POA: Diagnosis not present

## 2012-04-03 DIAGNOSIS — R072 Precordial pain: Secondary | ICD-10-CM | POA: Diagnosis not present

## 2012-04-03 DIAGNOSIS — I4891 Unspecified atrial fibrillation: Secondary | ICD-10-CM | POA: Diagnosis present

## 2012-04-03 DIAGNOSIS — Z7982 Long term (current) use of aspirin: Secondary | ICD-10-CM | POA: Diagnosis not present

## 2012-04-03 DIAGNOSIS — I495 Sick sinus syndrome: Secondary | ICD-10-CM | POA: Diagnosis present

## 2012-04-03 DIAGNOSIS — E039 Hypothyroidism, unspecified: Secondary | ICD-10-CM | POA: Diagnosis present

## 2012-04-03 DIAGNOSIS — E669 Obesity, unspecified: Secondary | ICD-10-CM | POA: Diagnosis present

## 2012-04-03 DIAGNOSIS — I2510 Atherosclerotic heart disease of native coronary artery without angina pectoris: Secondary | ICD-10-CM | POA: Diagnosis not present

## 2012-04-03 DIAGNOSIS — E7800 Pure hypercholesterolemia, unspecified: Secondary | ICD-10-CM | POA: Diagnosis present

## 2012-04-03 DIAGNOSIS — Z7902 Long term (current) use of antithrombotics/antiplatelets: Secondary | ICD-10-CM | POA: Diagnosis not present

## 2012-04-03 DIAGNOSIS — I214 Non-ST elevation (NSTEMI) myocardial infarction: Secondary | ICD-10-CM | POA: Diagnosis present

## 2012-04-03 DIAGNOSIS — F411 Generalized anxiety disorder: Secondary | ICD-10-CM | POA: Diagnosis present

## 2012-04-03 DIAGNOSIS — K219 Gastro-esophageal reflux disease without esophagitis: Secondary | ICD-10-CM | POA: Diagnosis present

## 2012-04-03 DIAGNOSIS — Z6833 Body mass index (BMI) 33.0-33.9, adult: Secondary | ICD-10-CM | POA: Diagnosis not present

## 2012-04-03 DIAGNOSIS — K449 Diaphragmatic hernia without obstruction or gangrene: Secondary | ICD-10-CM | POA: Diagnosis present

## 2012-04-03 DIAGNOSIS — R32 Unspecified urinary incontinence: Secondary | ICD-10-CM | POA: Diagnosis present

## 2012-04-03 DIAGNOSIS — Z950 Presence of cardiac pacemaker: Secondary | ICD-10-CM | POA: Diagnosis not present

## 2012-04-03 DIAGNOSIS — Z87891 Personal history of nicotine dependence: Secondary | ICD-10-CM | POA: Diagnosis not present

## 2012-04-03 DIAGNOSIS — Z79899 Other long term (current) drug therapy: Secondary | ICD-10-CM | POA: Diagnosis not present

## 2012-04-03 LAB — URINALYSIS, ROUTINE W REFLEX MICROSCOPIC
Bilirubin Urine: NEGATIVE
Glucose, UA: NEGATIVE mg/dL
Hgb urine dipstick: NEGATIVE
Ketones, ur: NEGATIVE mg/dL
Protein, ur: NEGATIVE mg/dL
pH: 7 (ref 5.0–8.0)

## 2012-04-03 LAB — CBC WITH DIFFERENTIAL/PLATELET
Basophils Relative: 1 % (ref 0–1)
Eosinophils Absolute: 0.3 10*3/uL (ref 0.0–0.7)
HCT: 39.7 % (ref 36.0–46.0)
Hemoglobin: 13.9 g/dL (ref 12.0–15.0)
Lymphs Abs: 4 10*3/uL (ref 0.7–4.0)
MCH: 28.8 pg (ref 26.0–34.0)
MCHC: 35 g/dL (ref 30.0–36.0)
Monocytes Absolute: 1.2 10*3/uL — ABNORMAL HIGH (ref 0.1–1.0)
Monocytes Relative: 9 % (ref 3–12)
Neutro Abs: 7.3 10*3/uL (ref 1.7–7.7)
RBC: 4.83 MIL/uL (ref 3.87–5.11)

## 2012-04-03 LAB — BASIC METABOLIC PANEL
CO2: 30 mEq/L (ref 19–32)
Chloride: 101 mEq/L (ref 96–112)
Creatinine, Ser: 0.98 mg/dL (ref 0.50–1.10)
GFR calc Af Amer: 69 mL/min — ABNORMAL LOW (ref >90)
Potassium: 3 mEq/L — ABNORMAL LOW (ref 3.5–5.1)
Sodium: 141 mEq/L (ref 135–145)

## 2012-04-03 LAB — COMPREHENSIVE METABOLIC PANEL
ALT: 19 U/L (ref 0–35)
Alkaline Phosphatase: 25 U/L — ABNORMAL LOW (ref 39–117)
BUN: 11 mg/dL (ref 6–23)
CO2: 27 mEq/L (ref 19–32)
GFR calc Af Amer: 70 mL/min — ABNORMAL LOW (ref >90)
GFR calc non Af Amer: 60 mL/min — ABNORMAL LOW (ref >90)
Glucose, Bld: 114 mg/dL — ABNORMAL HIGH (ref 70–99)
Potassium: 3 mEq/L — ABNORMAL LOW (ref 3.5–5.1)
Sodium: 140 mEq/L (ref 135–145)
Total Bilirubin: 0.5 mg/dL (ref 0.3–1.2)
Total Protein: 7.1 g/dL (ref 6.0–8.3)

## 2012-04-03 LAB — MAGNESIUM: Magnesium: 1.5 mg/dL (ref 1.5–2.5)

## 2012-04-03 LAB — POTASSIUM: Potassium: 3.5 mEq/L (ref 3.5–5.1)

## 2012-04-03 LAB — URINE MICROSCOPIC-ADD ON

## 2012-04-03 LAB — HEPARIN LEVEL (UNFRACTIONATED)
Heparin Unfractionated: 0.23 IU/mL — ABNORMAL LOW (ref 0.30–0.70)
Heparin Unfractionated: 0.74 IU/mL — ABNORMAL HIGH (ref 0.30–0.70)

## 2012-04-03 LAB — LIPID PANEL
HDL: 70 mg/dL (ref >39)
LDL Cholesterol: 62 mg/dL (ref 0–99)

## 2012-04-03 LAB — PROTIME-INR
INR: 1.03 (ref 0.00–1.49)
Prothrombin Time: 13.7 seconds (ref 11.6–15.2)

## 2012-04-03 MED ORDER — ONDANSETRON HCL 4 MG/2ML IJ SOLN: 4.0000 mg | Freq: Four times a day (QID) | INTRAMUSCULAR | Status: DC | PRN

## 2012-04-03 MED ORDER — POTASSIUM CHLORIDE CRYS ER 20 MEQ PO TBCR
40.0000 meq | EXTENDED_RELEASE_TABLET | Freq: Once | ORAL | Status: AC
  Administered 2012-04-03: 40 meq via ORAL
  Filled 2012-04-03: qty 2

## 2012-04-03 MED ORDER — DIAZEPAM 5 MG PO TABS
5.0000 mg | ORAL_TABLET | ORAL | Status: AC
  Administered 2012-04-04: 5 mg via ORAL
  Filled 2012-04-03: qty 1

## 2012-04-03 MED ORDER — CARVEDILOL 12.5 MG PO TABS
12.5000 mg | ORAL_TABLET | Freq: Two times a day (BID) | ORAL | Status: DC
  Administered 2012-04-03 – 2012-04-05 (×5): 12.5 mg via ORAL
  Filled 2012-04-03 (×7): qty 1

## 2012-04-03 MED ORDER — ASPIRIN 300 MG RE SUPP
300.0000 mg | RECTAL | Status: DC
  Filled 2012-04-03: qty 1

## 2012-04-03 MED ORDER — HEPARIN BOLUS VIA INFUSION
1000.0000 [IU] | Freq: Once | INTRAVENOUS | Status: AC
  Administered 2012-04-03: 1000 [IU] via INTRAVENOUS
  Filled 2012-04-03: qty 1000

## 2012-04-03 MED ORDER — SODIUM CHLORIDE 0.9 % IJ SOLN
3.0000 mL | Freq: Two times a day (BID) | INTRAMUSCULAR | Status: DC
  Administered 2012-04-03 – 2012-04-04 (×3): 3 mL via INTRAVENOUS

## 2012-04-03 MED ORDER — ASPIRIN 81 MG PO CHEW
324.0000 mg | CHEWABLE_TABLET | ORAL | Status: AC
  Administered 2012-04-04: 324 mg via ORAL
  Filled 2012-04-03: qty 4

## 2012-04-03 MED ORDER — CARVEDILOL 12.5 MG PO TABS
12.5000 mg | ORAL_TABLET | Freq: Two times a day (BID) | ORAL | Status: DC
  Filled 2012-04-03 (×2): qty 1

## 2012-04-03 MED ORDER — ALPRAZOLAM 0.25 MG PO TABS
0.2500 mg | ORAL_TABLET | Freq: Two times a day (BID) | ORAL | Status: DC | PRN
  Administered 2012-04-03: 0.25 mg via ORAL
  Filled 2012-04-03: qty 1

## 2012-04-03 MED ORDER — ASPIRIN 81 MG PO TABS: 81.0000 mg | ORAL_TABLET | Freq: Every day | ORAL | Status: DC

## 2012-04-03 MED ORDER — ASPIRIN 81 MG PO CHEW: 324.0000 mg | CHEWABLE_TABLET | ORAL | Status: DC

## 2012-04-03 MED ORDER — FAMOTIDINE 20 MG PO TABS
20.0000 mg | ORAL_TABLET | Freq: Every day | ORAL | Status: DC
  Administered 2012-04-03 – 2012-04-05 (×3): 20 mg via ORAL
  Filled 2012-04-03 (×3): qty 1

## 2012-04-03 MED ORDER — SODIUM CHLORIDE 0.9 % IJ SOLN: 3.0000 mL | Freq: Two times a day (BID) | INTRAMUSCULAR | Status: DC

## 2012-04-03 MED ORDER — ROSUVASTATIN CALCIUM 10 MG PO TABS
10.0000 mg | ORAL_TABLET | Freq: Every day | ORAL | Status: DC
  Administered 2012-04-03 – 2012-04-05 (×3): 10 mg via ORAL
  Filled 2012-04-03 (×5): qty 1

## 2012-04-03 MED ORDER — SODIUM CHLORIDE 0.9 % IV SOLN: 250.0000 mL | INTRAVENOUS | Status: DC | PRN

## 2012-04-03 MED ORDER — FLECAINIDE ACETATE 100 MG PO TABS
100.0000 mg | ORAL_TABLET | Freq: Two times a day (BID) | ORAL | Status: DC
  Administered 2012-04-03 – 2012-04-04 (×3): 100 mg via ORAL
  Filled 2012-04-03 (×5): qty 1

## 2012-04-03 MED ORDER — NITROGLYCERIN 0.4 MG SL SUBL: 0.4000 mg | SUBLINGUAL_TABLET | SUBLINGUAL | Status: DC | PRN

## 2012-04-03 MED ORDER — ASPIRIN EC 81 MG PO TBEC
81.0000 mg | DELAYED_RELEASE_TABLET | Freq: Every day | ORAL | Status: DC
  Administered 2012-04-05: 10:00:00 81 mg via ORAL
  Filled 2012-04-03 (×2): qty 1

## 2012-04-03 MED ORDER — ACETAMINOPHEN 325 MG PO TABS: 650.0000 mg | ORAL_TABLET | ORAL | Status: DC | PRN

## 2012-04-03 MED ORDER — SODIUM CHLORIDE 0.9 % IJ SOLN: 3.0000 mL | INTRAMUSCULAR | Status: DC | PRN

## 2012-04-03 MED ORDER — ATORVASTATIN CALCIUM 20 MG PO TABS
20.0000 mg | ORAL_TABLET | Freq: Every day | ORAL | Status: DC
  Filled 2012-04-03: qty 1

## 2012-04-03 MED ORDER — TRIAMTERENE-HCTZ 75-50 MG PO TABS
1.0000 | ORAL_TABLET | Freq: Every day | ORAL | Status: DC
  Administered 2012-04-03 – 2012-04-05 (×3): 1 via ORAL
  Filled 2012-04-03 (×3): qty 1

## 2012-04-03 MED ORDER — SODIUM CHLORIDE 0.9 % IV SOLN
1.0000 mL/kg/h | INTRAVENOUS | Status: DC
  Administered 2012-04-04: 1 mL/kg/h via INTRAVENOUS

## 2012-04-03 MED ORDER — NON FORMULARY: 10.0000 mg | Freq: Every day | Status: DC

## 2012-04-03 MED ORDER — LEVOTHYROXINE SODIUM 100 MCG PO TABS
100.0000 ug | ORAL_TABLET | Freq: Every day | ORAL | Status: DC
  Administered 2012-04-03 – 2012-04-05 (×3): 100 ug via ORAL
  Filled 2012-04-03 (×5): qty 1

## 2012-04-03 NOTE — Progress Notes (Signed)
ANTICOAGULATION CONSULT NOTE - Follow up Consult  Pharmacy Consult for heparin Indication: chest pain/ACS  Allergies  Allergen Reactions  . Codeine     nausea  . Tape     Rash     Patient Measurements: Height: 5\' 3"  (160 cm) Weight: 188 lb 7.9 oz (85.5 kg) IBW/kg (Calculated) : 52.4  Heparin Dosing Weight:  72 kg  Vital Signs: Temp: 98.2 F (36.8 C) (09/02 0350) Temp src: Oral (09/02 0350) BP: 129/85 mmHg (09/02 0848) Pulse Rate: 76  (09/02 0848)  Labs:  Basename 04/03/12 0900 04/03/12 0415 04/03/12 0412 04/02/12 2331 04/02/12 2303  HGB -- -- 13.9 14.6 --  HCT -- -- 39.7 43.0 41.0  PLT -- -- 268 -- 277  APTT -- -- -- -- --  LABPROT -- -- -- -- --  INR -- -- -- -- --  HEPARINUNFRC 0.23* -- -- -- --  CREATININE -- -- 0.97 1.20* --  CKTOTAL -- -- -- -- --  CKMB -- -- -- -- --  TROPONINI -- 2.81* -- -- --    Estimated Creatinine Clearance: 59.9 ml/min (by C-G formula based on Cr of 0.97).   Medical History: Past Medical History  Diagnosis Date  . HTN (hypertension)   . Hypercholesteremia   . GERD (gastroesophageal reflux disease)   . Hypothyroidism   . Anemia   . Blood transfusion     " no reaction "  . H/O hiatal hernia   . Arthritis   . PAF (paroxysmal atrial fibrillation)   . Tachy-brady syndrome     a. post-termination pauses in setting of PAF;  b. 10/28/11 - St. Jude Dual Chamber PPM SN [id number]     Medications:  Prescriptions prior to admission  Medication Sig Dispense Refill  . aspirin 81 MG tablet Take 81 mg by mouth daily.      . carvedilol (COREG) 12.5 MG tablet Take 1 tablet (12.5 mg total) by mouth 2 (two) times daily.  60 tablet  11  . CRESTOR 10 MG tablet Take 10 mg by mouth Daily.      . flecainide (TAMBOCOR) 100 MG tablet Take 1 tablet (100 mg total) by mouth every 12 (twelve) hours.  60 tablet  6  . levothyroxine (SYNTHROID, LEVOTHROID) 100 MCG tablet Take 100 mcg by mouth Daily.      . ranitidine (ZANTAC) 75 MG tablet Take 75 mg by  mouth daily.      Marland Kitchen triamterene-hydrochlorothiazide (MAXZIDE) 75-50 MG per tablet Take 75 mg by mouth Daily.        Assessment: 66 yr old female on heparin for ACS --hx of afib and tachy-brady syndrome with pacer placement. Heparin level subtherapeutic.   Goal of Therapy:  Heparin level 0.3-0.7 units/ml Monitor platelets by anticoagulation protocol: Yes   Plan:  Bolus heparin 1000 units x 1, then increase heparin drip to 1150 units/hr. Heparin level 6 hours after rate change.   Daily HL and cbc to begin 9/3  Wendie Simmer, PharmD, BCPS Clinical Pharmacist  Pager: [phone]

## 2012-04-03 NOTE — Progress Notes (Signed)
ANTICOAGULATION CONSULT NOTE - Follow up Consult  Pharmacy Consult for heparin Indication: chest pain/ACS  Allergies  Allergen Reactions  . Codeine     nausea  . Tape     Rash     Patient Measurements: Height: 5\' 3"  (160 cm) Weight: 188 lb 7.9 oz (85.5 kg) IBW/kg (Calculated) : 52.4  Heparin Dosing Weight:  72 kg  Vital Signs: Temp: 98.3 F (36.8 C) (09/02 1353) BP: 118/75 mmHg (09/02 1511) Pulse Rate: 65  (09/02 1353)  Labs:  Basename 04/03/12 1626 04/03/12 1135 04/03/12 0900 04/03/12 0415 04/03/12 0412 04/02/12 2331 04/02/12 2303  HGB -- -- -- -- 13.9 14.6 --  HCT -- -- -- -- 39.7 43.0 41.0  PLT -- -- -- -- 268 -- 277  APTT -- -- -- -- -- -- --  LABPROT -- 13.7 -- -- -- -- --  INR -- 1.03 -- -- -- -- --  HEPARINUNFRC 0.74* -- 0.23* -- -- -- --  CREATININE -- 0.98 -- -- 0.97 1.20* --  CKTOTAL -- -- -- -- -- -- --  CKMB -- -- -- -- -- -- --  TROPONINI 1.08* -- 1.86* 2.81* -- -- --    Estimated Creatinine Clearance: 59.3 ml/min (by C-G formula based on Cr of 0.98).   Medical History: Past Medical History  Diagnosis Date  . HTN (hypertension)   . Hypercholesteremia   . GERD (gastroesophageal reflux disease)   . Hypothyroidism   . Anemia   . Blood transfusion     " no reaction "  . H/O hiatal hernia   . Arthritis   . PAF (paroxysmal atrial fibrillation)   . Tachy-brady syndrome     a. post-termination pauses in setting of PAF;  b. 10/28/11 - St. Jude Dual Chamber PPM SN [id number]     Medications:  Prescriptions prior to admission  Medication Sig Dispense Refill  . aspirin 81 MG tablet Take 81 mg by mouth daily.      . carvedilol (COREG) 12.5 MG tablet Take 1 tablet (12.5 mg total) by mouth 2 (two) times daily.  60 tablet  11  . CRESTOR 10 MG tablet Take 10 mg by mouth Daily.      . flecainide (TAMBOCOR) 100 MG tablet Take 1 tablet (100 mg total) by mouth every 12 (twelve) hours.  60 tablet  6  . levothyroxine (SYNTHROID, LEVOTHROID) 100 MCG tablet Take  100 mcg by mouth Daily.      . ranitidine (ZANTAC) 75 MG tablet Take 75 mg by mouth daily.      Marland Kitchen triamterene-hydrochlorothiazide (MAXZIDE) 75-50 MG per tablet Take 75 mg by mouth Daily.        Assessment: 66 yr old female on heparin for ACS --hx of afib and tachy-brady syndrome with pacer placement. Heparin level (0.74) is above-goal on 1150 units/hr. Confirmed with RN that lab was drawn from opposite arm of heparin infusion.   Goal of Therapy:  Heparin level 0.3-0.7 units/ml Monitor platelets by anticoagulation protocol: Yes   Plan:  1. Decrease IV heparin to 1100 units/hr.  2. Heparin level in 6 hours.  Lorre Munroe, PharmD 04/03/12, 05:35 PM

## 2012-04-03 NOTE — ED Notes (Signed)
Patient states that she feels SOB when laying flat or in semi-fowler's.  Patient HOB elevated and patient reported decrease in SOB.  RN Cletis Athens informed.

## 2012-04-03 NOTE — ED Notes (Signed)
Patient states that she felt SOB after ambulating to the restroom.  RR noted to be 24, SaO2 93% on room air upon returning to the room.  RN Cletis Athens informed.

## 2012-04-03 NOTE — Progress Notes (Signed)
Dr Antoine Poche notified of  Troponin 2.81.  Pt stable, Heparin infusing, denies chest pain at this time.  No new orders received. Will continue to monitor. Dierdre Highman

## 2012-04-03 NOTE — ED Notes (Signed)
Alert, NAD, calm, interactive, "feels better with head raised and O2 [place]. Report called to receiving primary RN 3000.

## 2012-04-03 NOTE — ED Notes (Signed)
Card MD at [facility], pt alert, NAD, calm, interactive.

## 2012-04-03 NOTE — Progress Notes (Signed)
ANTICOAGULATION CONSULT NOTE - Initial Consult  Pharmacy Consult for heparin Indication: chest pain/ACS  Allergies  Allergen Reactions  . Codeine     nausea  . Tape     Rash     Patient Measurements: Height: 5\' 3"  (160 cm) Weight: 188 lb 7.9 oz (85.5 kg) IBW/kg (Calculated) : 52.4  Heparin Dosing Weight:   Vital Signs: Temp: 98.2 F (36.8 C) (09/02 0350) Temp src: Oral (09/02 0350) BP: 149/90 mmHg (09/02 0350) Pulse Rate: 85  (09/02 0350)  Labs:  Basename 04/02/12 2331 04/02/12 2303  HGB 14.6 14.5  HCT 43.0 41.0  PLT -- 277  APTT -- --  LABPROT -- --  INR -- --  HEPARINUNFRC -- --  CREATININE 1.20* --  CKTOTAL -- --  CKMB -- --  TROPONINI -- --    Estimated Creatinine Clearance: 48.4 ml/min (by C-G formula based on Cr of 1.2).   Medical History: Past Medical History  Diagnosis Date  . HTN (hypertension)   . Hypercholesteremia   . GERD (gastroesophageal reflux disease)   . Hypothyroidism   . Anemia   . Blood transfusion     " no reaction "  . H/O hiatal hernia   . Arthritis   . PAF (paroxysmal atrial fibrillation)   . Tachy-brady syndrome     a. post-termination pauses in setting of PAF;  b. 10/28/11 - St. Jude Dual Chamber PPM SN [id number]     Medications:  Prescriptions prior to admission  Medication Sig Dispense Refill  . aspirin 81 MG tablet Take 81 mg by mouth daily.      . carvedilol (COREG) 12.5 MG tablet Take 1 tablet (12.5 mg total) by mouth 2 (two) times daily.  60 tablet  11  . CRESTOR 10 MG tablet Take 10 mg by mouth Daily.      . flecainide (TAMBOCOR) 100 MG tablet Take 1 tablet (100 mg total) by mouth every 12 (twelve) hours.  60 tablet  6  . levothyroxine (SYNTHROID, LEVOTHROID) 100 MCG tablet Take 100 mcg by mouth Daily.      . ranitidine (ZANTAC) 75 MG tablet Take 75 mg by mouth daily.      Marland Kitchen triamterene-hydrochlorothiazide (MAXZIDE) 75-50 MG per tablet Take 75 mg by mouth Daily.        Assessment: ACS --hx of afib and  tachy-brady syndrome with pacer placement. Slightly elevated troponin heparin started in ed with 4000 unit bolus and 1000 unit/hr. Goal of Therapy:  Heparin level 0.3-0.7 units/ml Monitor platelets by anticoagulation protocol: Yes   Plan:  Continue heparin at 1000 units/hr. And check HL at 0800  Daily HL and cbc to begin 9/3  Janice Coffin 04/03/2012,4:05 AM

## 2012-04-03 NOTE — H&P (Signed)
CARDIOLOGY ADMISSION NOTE  Patient ID: Cheryl [name] MRN: [id number] DOB/AGE: 65/14/1947 65 y.o.  Admit date: 04/02/2012 Primary Physician   Dr. Ihor Dow Primary Cardiologist   Dr. Mayford Knife Chief Complaint    Arm pain.  HPI: The patient has no prior history of coronary artery disease. She does have a history of atrial fibrillation with tachybradycardia syndrome. She reports having a stress test about a year ago. She status post pacemaker placement earlier this year.  She presented to the emergency room tonight via ambulance. She said she had an episode of discomfort behind her left ear. This happened at rest. She felt like she had difficulty swallowing and it needed to pop.  She heaviness in her arms and also some heaviness in her legs and actually had to have help lifting them. She didn't have any visual or speech disturbances. She felt very "jittery and shaky" and generally weak. Her symptoms resolved but then recurred. She said she had a similar episode a couple of weeks ago while in [facility] at awake. She was apparently admitted to the hospital but doesn't report any workup. She otherwise has been doing her usual activities which doesn't include exercise but does include doing some yard work. She's been able to trim her yard without chest pressure, neck or arm discomfort. She's not noticed any palpitations and has had no presyncope or syncope. Tonight she had no chest pressure. She simply has felt like she is needed to burp. In the emergency room EKG had nonspecific changes But her troponin was slightly elevated. Her potassium was quite low and has been supplemented. She's not been taking her potassium supplementation at home as she has trouble with pills. Of note her family also report stress and anxiety.   Past Medical History  Diagnosis Date  . HTN (hypertension)   . Hypercholesteremia   . GERD (gastroesophageal reflux disease)   . Hypothyroidism   . Anemia   . Blood  transfusion     " no reaction "  . H/O hiatal hernia   . Arthritis   . PAF (paroxysmal atrial fibrillation)   . Tachy-brady syndrome     a. post-termination pauses in setting of PAF;  b. 10/28/11 - [id number] Jude Dual Chamber PPM SN [id number]     Past Surgical History  Procedure Date  . Permanent pacemaker 10/28/2011  . Breast surgery     Benign lump    Allergies  Allergen Reactions  . Codeine     nausea  . Tape     Rash    No current facility-administered medications on file prior to encounter.   Current Outpatient Prescriptions on File Prior to Encounter  Medication Sig Dispense Refill  . aspirin 81 MG tablet Take 81 mg by mouth daily.      . carvedilol (COREG) 12.5 MG tablet Take 1 tablet (12.5 mg total) by mouth 2 (two) times daily.  60 tablet  11  . CRESTOR 10 MG tablet Take 10 mg by mouth Daily.      . flecainide (TAMBOCOR) 100 MG tablet Take 1 tablet (100 mg total) by mouth every 12 (twelve) hours.  60 tablet  6  . levothyroxine (SYNTHROID, LEVOTHROID) 100 MCG tablet Take 100 mcg by mouth Daily.      . ranitidine (ZANTAC) 75 MG tablet Take 75 mg by mouth daily.      Marland Kitchen triamterene-hydrochlorothiazide (MAXZIDE) 75-50 MG per tablet Take 75 mg by mouth Daily.       History  Social History  . Marital Status: Married    Spouse Name: N/A    Number of Children: 4  . Years of Education: N/A   Occupational History  . Not on file.   Social History Main Topics  . Smoking status: Former Smoker    Quit date: 08/02/1977  . Smokeless tobacco: Never Used  . Alcohol Use: No  . Drug Use: No  . Sexually Active: Not Currently    Birth Control/ Protection: Post-menopausal   Other Topics Concern  . Not on file   Social History Narrative   Lives with husband.      Family History  Problem Relation Age of Onset  . Heart attack Father 59  . Heart attack Mother 63  . Cancer Mother     Died age 13 with pancreatic cancer, also had uterine and breast cancer    ROS   Occasional  dizzzy spells, urinary incontinence.  Otherwise as stated in the HPI and negative for all other systems.  Physical Exam: Blood pressure 149/99, temperature 98.6 F (37 C), temperature source Oral, resp. rate 18, SpO2 97.00%.  GENERAL:  Well appearing HEENT:  Pupils equal round and reactive, fundi not visualized, oral mucosa unremarkable NECK:  No jugular venous distention, waveform within normal limits, carotid upstroke brisk and symmetric, no bruits, no thyromegaly LYMPHATICS:  No cervical, inguinal adenopathy LUNGS:  Clear to auscultation bilaterally BACK:  No CVA tenderness CHEST:  Well healed pacemaker pocket. HEART:  PMI not displaced or sustained,S1 and S2 within normal limits, no S3, no S4, no clicks, no rubs, no murmurs ABD:  Flat, positive bowel sounds normal in frequency in pitch, no bruits, no rebound, no guarding, no midline pulsatile mass, no hepatomegaly, no splenomegaly EXT:  2 plus pulses throughout, no edema, no cyanosis no clubbing SKIN:  No rashes no nodules NEURO:  Cranial nerves II through XII grossly intact, motor grossly intact throughout PSYCH:  Cognitively intact, oriented to person place and time, tearful  Labs: Lab Results  Component Value Date   BUN 12 04/02/2012   Lab Results  Component Value Date   CREATININE 1.20* 04/02/2012   Lab Results  Component Value Date   NA 142 04/02/2012   K 2.7* 04/02/2012   CL 101 04/02/2012    Lab Results  Component Value Date   WBC 8.9 04/02/2012   HGB 14.6 04/02/2012   HCT 43.0 04/02/2012   MCV 81.8 04/02/2012   PLT 277 04/02/2012    Radiology:  CXR:  Mild central airway thickening suggesting bronchitis. No evidence  of pneumonia or edema.   EKG: NSR rate 83, axis WNL, QTC prolonged.  Diffuse nonspecific T wave flattening.  04/03/2012   ASSESSMENT AND PLAN:    Chest pain - Her chest pain is atypical.  However, the troponin is slightly elevated.  She will be admitted on heparin.  I will continue to cycle enzymes.  If these  are positive cath.  If this is the only abnormality her pain is very atypical and I would suggest a noninvasive evaluation.    Hypokalemia - She was supplement in the ER.  Repeat a BMET in the am.  Of note she has not been taking her KCl as she cannot swallow the pills.  She would like another formulation at discharge.     PAF - The patient maintains NSR 99% of the time.  She will remain on flecainide.  HTN - BP controlled on current therapy  Hypothyroidism - The patient will  continue her previous therapy.    Hyperlipidemia - Per Dr. Mayford Knife.    Urinary incontinence - I will check a UA  Anxiety - Her daughters brought up this issue.  This should be addressed with her primary MD.  Signed: Rollene Rotunda 04/03/2012, 12:57 AM

## 2012-04-03 NOTE — Progress Notes (Signed)
Subjective:  Overall anxious but no further chest discomfort. Still having difficulty swallowing pills. See below. No further shortness of breath.  Objective:  Vital Signs in the last 24 hours: Temp:  [98.2 F (36.8 C)-98.6 F (37 C)] 98.2 F (36.8 C) (09/02 0350) Pulse Rate:  [76-95] 76  (09/02 0848) Resp:  [15-23] 18  (09/02 0350) BP: (105-149)/(62-102) 129/85 mmHg (09/02 0848) SpO2:  [94 %-99 %] 99 % (09/02 0350) Weight:  [85.5 kg (188 lb 7.9 oz)] 85.5 kg (188 lb 7.9 oz) (09/02 0350)  Intake/Output from previous day:     Physical Exam: General: Well developed, well nourished, in no acute distress. Head:  Normocephalic and atraumatic. Lungs: Clear to auscultation and percussion. Heart: Normal S1 and S2.  No murmur, rubs or gallops.  Abdomen: soft, non-tender, positive bowel sounds. Overweight Extremities: No clubbing or cyanosis. No edema. Neurologic: Alert and oriented x 3.    Lab Results:  Basename 04/03/12 0412 04/02/12 2331 04/02/12 2303  WBC 12.8* -- 8.9  HGB 13.9 14.6 --  PLT 268 -- 277    Basename 04/03/12 0412 04/02/12 2331  NA 140 142  K 3.0* 2.7*  CL 101 101  CO2 27 --  GLUCOSE 114* 109*  BUN 11 12  CREATININE 0.97 1.20*    Basename 04/03/12 0900 04/03/12 0415  TROPONINI 1.86* 2.81*   Hepatic Function Panel  Basename 04/03/12 0412  PROT 7.1  ALBUMIN 3.8  AST 24  ALT 19  ALKPHOS 25*  BILITOT 0.5  BILIDIR --  IBILI --    Basename 04/03/12 0412  CHOL 153   No results found for this basename: PROTIME in the last 72 hours  Imaging: Dg Chest 2 View  04/02/2012  *RADIOLOGY REPORT*  Clinical Data: Worsening shortness of breath.  Weakness.  CHEST - 2 VIEW  Comparison: 10/29/2011.  Findings: Left subclavian pacemaker leads are unchanged within the right atrium and right ventricle.  Heart size and mediastinal contours are stable.  There is mild central airway thickening without confluent airspace opacity, edema or significant pleural effusion.   Osseous structures appear unchanged.  Telemetry leads overlie the chest.  IMPRESSION: Mild central airway thickening suggesting bronchitis.  No evidence of pneumonia or edema.   Original Report Authenticated By: Gerrianne Scale, M.D.    Personally viewed.   Telemetry: Occasional pacer spikes seen, no adverse arrhythmias Personally viewed.   EKG:  T wave inversion noted in precordial leads, V2, V3, possible ischemia, sinus rhythm   Assessment/Plan:   1. Non-ST elevation myocardial infarction- troponin has peaked at 2.81 and is now 1.86. EKG concerning for ischemic changes in the precordial leads. I have discussed with her and her daughter cardiac catheterization. N.p.o. past midnight. Risks and benefits including stroke, heart attack, death, renal impairment have been explained to the patient. She has not had a prior cardiac catheterization. She had a pacemaker placed earlier this year. Her symptoms are quite atypical. I explained to them that there are other reasons for possible elevation in troponin but cardiac catheterization was begun first to exclude coronary artery disease.  HPI per Dr. Wyman Songster " She said she had an episode of discomfort behind her left ear. This happened at rest. She felt like she had difficulty swallowing and it needed to pop. She heaviness in her arms and also some heaviness in her legs and actually had to have help lifting them. She didn't have any visual or speech disturbances. She felt very "jittery and shaky" and generally weak. Her  symptoms resolved but then recurred. She said she had a similar episode a couple of weeks ago while in [facility] at awake. She was apparently admitted to the hospital but doesn't report any workup. She otherwise has been doing her usual activities which doesn't include exercise but does include doing some yard work. She's been able to trim her yard without chest pressure, neck or arm discomfort. She's not noticed any  palpitations and has had no presyncope or syncope. Tonight she had no chest pressure. She simply has felt like she is needed to burp. In the emergency room EKG had nonspecific changes But her troponin was slightly elevated. Her potassium was quite low and has been supplemented. She's not been taking her potassium supplementation at home as she has trouble with pills. Of note her family also report stress and anxiety. "     2. Abnormal EKG-as described above  3. Obesity-diet, weight loss.  4. Status post pacemaker-seems to be functioning perfectly.  5. Anxiety-her daughter brought this up with me as well. Asked her to discuss this further with Dr. Ihor Dow. I explained that it was natural to be anxious in the setting however if anxiety is overwhelming in normal situations that there are ways to help with this.  6. Hypokalemia-continue to replete. She is unable to tolerate a full pill. By mouth gets stuck. She has seen GI in the distant past. She may need to revisit. For now, crush potassium in applesauce. Liquid form is reasonable as well. She admits that she has not been taking it for the past 3 weeks.  7. Atrial fibrillation-currently in sinus rhythm on flecainide. If coronary artery disease is present, I would recommend changing to another antiarrhythmic. She is not on Coumadin. I will check an INR.    Kitt Minardi 04/03/2012, 11:23 AM

## 2012-04-04 ENCOUNTER — Encounter (HOSPITAL_COMMUNITY)
Admission: EM | Disposition: A | Discharge status: Home / Self Care | Payer: Self-pay | Source: Home / Self Care | Attending: Cardiology

## 2012-04-04 DIAGNOSIS — E785 Hyperlipidemia, unspecified: Secondary | ICD-10-CM | POA: Diagnosis not present

## 2012-04-04 DIAGNOSIS — I2510 Atherosclerotic heart disease of native coronary artery without angina pectoris: Secondary | ICD-10-CM | POA: Diagnosis not present

## 2012-04-04 DIAGNOSIS — I10 Essential (primary) hypertension: Secondary | ICD-10-CM | POA: Diagnosis not present

## 2012-04-04 DIAGNOSIS — I214 Non-ST elevation (NSTEMI) myocardial infarction: Secondary | ICD-10-CM | POA: Diagnosis not present

## 2012-04-04 HISTORY — PX: PERCUTANEOUS CORONARY INTERVENTION-BALLOON ONLY: SHX6014

## 2012-04-04 HISTORY — PX: LEFT HEART CATHETERIZATION WITH CORONARY ANGIOGRAM: SHX5451

## 2012-04-04 LAB — BASIC METABOLIC PANEL
BUN: 8 mg/dL (ref 6–23)
Calcium: 9 mg/dL (ref 8.4–10.5)
GFR calc Af Amer: 77 mL/min — ABNORMAL LOW (ref >90)
GFR calc non Af Amer: 57 mL/min — ABNORMAL LOW (ref >90)
GFR calc non Af Amer: 67 mL/min — ABNORMAL LOW (ref >90)
Glucose, Bld: 120 mg/dL — ABNORMAL HIGH (ref 70–99)
Potassium: 3.3 mEq/L — ABNORMAL LOW (ref 3.5–5.1)
Potassium: 3.7 mEq/L (ref 3.5–5.1)
Sodium: 139 mEq/L (ref 135–145)

## 2012-04-04 LAB — HEPARIN LEVEL (UNFRACTIONATED)
Heparin Unfractionated: 0.73 IU/mL — ABNORMAL HIGH (ref 0.30–0.70)
Heparin Unfractionated: 0.79 IU/mL — ABNORMAL HIGH (ref 0.30–0.70)

## 2012-04-04 LAB — CBC
Hemoglobin: 12.4 g/dL (ref 12.0–15.0)
MCH: 28.3 pg (ref 26.0–34.0)
MCHC: 33.7 g/dL (ref 30.0–36.0)
Platelets: 224 10*3/uL (ref 150–400)
RDW: 13.8 % (ref 11.5–15.5)

## 2012-04-04 LAB — POCT ACTIVATED CLOTTING TIME: Activated Clotting Time: 394 s

## 2012-04-04 LAB — PROTIME-INR
INR: 1.04 (ref 0.00–1.49)
Prothrombin Time: 13.8 seconds (ref 11.6–15.2)

## 2012-04-04 SURGERY — LEFT HEART CATHETERIZATION WITH CORONARY ANGIOGRAM
Anesthesia: LOCAL | Laterality: Right

## 2012-04-04 MED ORDER — ONDANSETRON HCL 4 MG/2ML IJ SOLN: 4.0000 mg | Freq: Four times a day (QID) | INTRAMUSCULAR | Status: DC | PRN

## 2012-04-04 MED ORDER — FENTANYL CITRATE 0.05 MG/ML IJ SOLN
INTRAMUSCULAR | Status: AC
  Filled 2012-04-04: qty 2

## 2012-04-04 MED ORDER — HEPARIN (PORCINE) IN NACL 2-0.9 UNIT/ML-% IJ SOLN
INTRAMUSCULAR | Status: AC
  Filled 2012-04-04: qty 2000

## 2012-04-04 MED ORDER — MIDAZOLAM HCL 2 MG/2ML IJ SOLN
INTRAMUSCULAR | Status: AC
  Filled 2012-04-04: qty 2

## 2012-04-04 MED ORDER — NITROGLYCERIN 0.2 MG/ML ON CALL CATH LAB
INTRAVENOUS | Status: AC
  Filled 2012-04-04: qty 1

## 2012-04-04 MED ORDER — SODIUM CHLORIDE 0.9 % IV SOLN
0.2500 mg/kg/h | INTRAVENOUS | Status: DC
  Filled 2012-04-04: qty 250

## 2012-04-04 MED ORDER — HEPARIN SODIUM (PORCINE) 1000 UNIT/ML IJ SOLN
INTRAMUSCULAR | Status: AC
  Filled 2012-04-04: qty 1

## 2012-04-04 MED ORDER — TICAGRELOR 90 MG PO TABS
ORAL_TABLET | ORAL | Status: AC
  Filled 2012-04-04: qty 2

## 2012-04-04 MED ORDER — SODIUM CHLORIDE 0.9 % IV SOLN
1.0000 mL/kg/h | INTRAVENOUS | Status: AC
  Administered 2012-04-04: 1 mL/kg/h via INTRAVENOUS

## 2012-04-04 MED ORDER — SODIUM CHLORIDE 0.9 % IV SOLN: INTRAVENOUS | Status: DC

## 2012-04-04 MED ORDER — DIAZEPAM 5 MG PO TABS
5.0000 mg | ORAL_TABLET | Freq: Four times a day (QID) | ORAL | Status: DC | PRN
  Administered 2012-04-04: 5 mg via ORAL
  Filled 2012-04-04: qty 1

## 2012-04-04 MED ORDER — BIVALIRUDIN 250 MG IV SOLR
INTRAVENOUS | Status: AC
  Filled 2012-04-04: qty 250

## 2012-04-04 MED ORDER — LIDOCAINE HCL (PF) 1 % IJ SOLN
INTRAMUSCULAR | Status: AC
  Filled 2012-04-04: qty 30

## 2012-04-04 MED ORDER — TICAGRELOR 90 MG PO TABS
90.0000 mg | ORAL_TABLET | Freq: Two times a day (BID) | ORAL | Status: DC
  Administered 2012-04-04 – 2012-04-05 (×3): 90 mg via ORAL
  Filled 2012-04-04 (×3): qty 1

## 2012-04-04 MED ORDER — NITROGLYCERIN IN D5W 200-5 MCG/ML-% IV SOLN: 10.0000 ug/min | INTRAVENOUS | Status: AC

## 2012-04-04 MED ORDER — ACETAMINOPHEN 325 MG PO TABS: 650.0000 mg | ORAL_TABLET | ORAL | Status: DC | PRN

## 2012-04-04 MED ORDER — ACETAMINOPHEN 325 MG PO TABS
650.0000 mg | ORAL_TABLET | ORAL | Status: DC | PRN
  Administered 2012-04-04 – 2012-04-05 (×2): 650 mg via ORAL
  Filled 2012-04-04 (×2): qty 2

## 2012-04-04 MED ORDER — TICAGRELOR 90 MG PO TABS
180.0000 mg | ORAL_TABLET | Freq: Once | ORAL | Status: AC
  Administered 2012-04-04: 180 mg via ORAL

## 2012-04-04 NOTE — Progress Notes (Signed)
R femoral area level 0 with no evidence of hematomas before sheath pull. Pressure applied for 30 minutes starting at 2130. Pt calm and resting while pressure was held. Pulses were present before and while pressure was held. Groin site had no evidence of hematomas after pressure held. R groin a level 1 after pressure due to a small bruise around the site. Gauze with a pressure dressing was applied. Cath instructions were given telling the pt if she feels like she needs to cough or sneeze to put pressure on the site and if she feels wet around the site to tell her nurse immediatly. Pt was able to tell me in her own words what she needed to do if she needed to cough or sneeze. Pt was resting and stable when finished.

## 2012-04-04 NOTE — Progress Notes (Addendum)
SUBJECTIVE:  No further chest pain  OBJECTIVE:   Vitals:   Filed Vitals:   04/03/12 1353 04/03/12 1511 04/03/12 2200 04/04/12 0600  BP: 108/65 118/75 125/93 105/67  Pulse: 65  72 67  Temp: 98.3 F (36.8 C)  97.7 F (36.5 C) 98.1 F (36.7 C)  TempSrc:      Resp: 18  17 16   Height:      Weight:      SpO2: 100%  99% 99%   I&O's:  No intake or output data in the 24 hours ending 04/04/12 1402 TELEMETRY: Reviewed telemetry pt in a paced:     PHYSICAL EXAM General: Well developed, well nourished, in no acute distress Head: Eyes PERRLA, No xanthomas.   Normal cephalic and atramatic  Lungs:   Clear bilaterally to auscultation and percussion. Heart:   HRRR S1 S2 Pulses are 2+ & equal Abdomen: Bowel sounds are positive, abdomen soft and non-tender without masses Extremities:   No clubbing, cyanosis or edema.  DP +1 Neuro: Alert and oriented X 3. Psych:  Good affect, responds appropriately   LABS: Basic Metabolic Panel:  Basename 04/03/12 2346 04/03/12 1626 04/03/12 1135 04/03/12 0412  NA 139 -- 141 --  K 3.3* 3.5 -- --  CL 103 -- 101 --  CO2 30 -- 30 --  GLUCOSE 120* -- 125* --  BUN 9 -- 10 --  CREATININE 1.01 -- 0.98 --  CALCIUM 9.0 -- 8.9 --  MG -- -- -- 1.5  PHOS -- -- -- --   Liver Function Tests:  Basename 04/03/12 0412  AST 24  ALT 19  ALKPHOS 25*  BILITOT 0.5  PROT 7.1  ALBUMIN 3.8   No results found for this basename: LIPASE:2,AMYLASE:2 in the last 72 hours CBC:  Basename 04/04/12 0520 04/03/12 0412 04/02/12 2303  WBC 6.9 12.8* --  NEUTROABS -- 7.3 4.3  HGB 12.4 13.9 --  HCT 36.8 39.7 --  MCV 84.0 82.2 --  PLT 224 268 --   Cardiac Enzymes:  Basename 04/03/12 1626 04/03/12 0900 04/03/12 0415  CKTOTAL -- -- --  CKMB -- -- --  CKMBINDEX -- -- --  TROPONINI 1.08* 1.86* 2.81*    Basename 04/03/12 0412  CHOL 153  HDL 70  LDLCALC 62  TRIG 105  CHOLHDL 2.2  LDLDIRECT --   Thyroid Function Tests:  Basename 04/03/12 0412  TSH 3.322    T4TOTAL --  T3FREE --  THYROIDAB --   Anemia Panel: No results found for this basename: VITAMINB12,FOLATE,FERRITIN,TIBC,IRON,RETICCTPCT in the last 72 hours Coag Panel:   Lab Results  Component Value Date   INR 1.04 04/04/2012   INR 1.03 04/03/2012   INR 1.0 10/21/2011    RADIOLOGY: Dg Chest 2 View  04/02/2012  *RADIOLOGY REPORT*  Clinical Data: Worsening shortness of breath.  Weakness.  CHEST - 2 VIEW  Comparison: 10/29/2011.  Findings: Left subclavian pacemaker leads are unchanged within the right atrium and right ventricle.  Heart size and mediastinal contours are stable.  There is mild central airway thickening without confluent airspace opacity, edema or significant pleural effusion.  Osseous structures appear unchanged.  Telemetry leads overlie the chest.  IMPRESSION: Mild central airway thickening suggesting bronchitis.  No evidence of pneumonia or edema.   Original Report Authenticated By: Gerrianne Scale, M.D.       ASSESSMENT:  1.  Chest pain s/p NSTEMI with no further pain. 2.  Hypokalemia 3.  Prolonged QTc on EKG ? Secondary to #2 4.  Tachybrady syndrome s/p PPM 5.  PAF on flecanide 6.  Anxiety  PLAN:   1.  Cardiac cath today to evaluate coronary anatomy 2.  Hold flecanide due to prolonged QTc 3.  Recheck potassium level  Quintella Reichert, MD  04/04/2012  2:02 PM

## 2012-04-04 NOTE — CV Procedure (Addendum)
PROCEDURE:  Left heart catheterization with selective coronary angiography, left ventriculogram.  INDICATIONS:    The risks, benefits, and details of the procedure were explained to the patient.  The patient verbalized understanding and wanted to proceed.  Informed written consent was obtained.  PROCEDURE TECHNIQUE:  After Xylocaine anesthesia a 64F sheath was placed in the right femoral artery with a single anterior needle wall stick.   Left coronary angiography was done using a Judkins L4 guide catheter.  Right coronary angiography was done using a Judkins R4 guide catheter.  Left ventriculography was done using a pigtail catheter.    CONTRAST:  Total of 75cc.  COMPLICATIONS:  None.    HEMODYNAMICS:  Aortic pressure was 159/26mmHg; LV pressure was 159/77mmHg; LVEDP .  There was no gradient between the left ventricle and aorta.    ANGIOGRAPHIC DATA:   The left main coronary artery is widely patent and bifurcates into an LAD and left circumflex arteries.  The left anterior descending artery is widely patent and gives rise to a large first diagonal which is widely patent.  The LAD then gives rise to a smaller second diagonal which is patent.  The left circumflex artery is widely patent.  It gives rise to a large OM1 which has an ostial 90% stenosis.  The ongoing OM1 is widely patent and bifurcates into 2 daughter vessels which are patent.  The ongoing left circumflex is patent and gives rise to a moderate sized OM2 which is patent.    The right coronary artery is widely patent.  LEFT VENTRICULOGRAM:  Left ventricular angiogram was done in the 30 RAO projection and revealed normal left ventricular wall motion and systolic function with an estimated ejection fraction of 60%.  LVEDP was 20 mmHg.  IMPRESSIONS:  1. Normal left main coronary artery. 2. Normal left anterior descending artery and its branches. 3. Normal left circumflex artery with 90% ostial stenosis of OM1. 4. Normal right  coronary artery. 5. Normal left ventricular systolic function.  LVEDP 20 mmHg.  Ejection fraction 60%.  RECOMMENDATION:   1.  PCI of OM1 by Dr. Katrinka Blazing 2.  Continue ASA/beta blocker/statin. 3.  Stop flecainide due to CAD

## 2012-04-04 NOTE — Progress Notes (Signed)
ANTICOAGULATION CONSULT NOTE - Follow Up Consult  Pharmacy Consult for heparin Indication: chest pain/ACS  Labs:  Basename 04/04/12 0520 04/03/12 2346 04/03/12 1626 04/03/12 1135 04/03/12 0900 04/03/12 0415 04/03/12 0412 04/02/12 2331 04/02/12 2303  HGB 12.4 -- -- -- -- -- 13.9 -- --  HCT 36.8 -- -- -- -- -- 39.7 43.0 --  PLT 224 -- -- -- -- -- 268 -- 277  APTT -- -- -- -- -- -- -- -- --  LABPROT 13.8 -- -- 13.7 -- -- -- -- --  INR 1.04 -- -- 1.03 -- -- -- -- --  HEPARINUNFRC 0.79* 0.73* 0.74* -- -- -- -- -- --  CREATININE -- 1.01 -- 0.98 -- -- 0.97 -- --  CKTOTAL -- -- -- -- -- -- -- -- --  CKMB -- -- -- -- -- -- -- -- --  TROPONINI -- -- 1.08* -- 1.86* 2.81* -- -- --    Assessment: 66yo female remains supratherapeutic on heparin with level increasing despite rate decreasing; scheduled for cath at noon.  Goal of Therapy:  Heparin level 0.3-0.7 units/ml   Plan:  Will decrease heparin gtt further to 9000 units/hr and f/u after cath.  Colleen Can PharmD BCPS 04/04/2012,6:45 AM

## 2012-04-04 NOTE — CV Procedure (Signed)
   PERCUTANEOUS CORONARY INTERVENTION   Cheryl [name] is a 66 y.o. female  INDICATION: Acute coronary syndrome with elevated troponin cardiac markers, and high-grade stenosis in the first obtuse marginal.   PROCEDURE: Cutting balloon angioplasty of obtuse marginal #1   CONSENT: The risks, benefits, and details of the procedure were explained to the patient. Risks including death, MI, stroke, bleeding, limb ischemia, renal failure and allergy were described and accepted by the patient.  Informed written consent was obtained prior to proceeding.  PROCEDURE TECHNIQUE:  After Xylocaine anesthesia a 6 French sheath was exchanged for the 5 [place] diagnostic sheath in the right femoral artery using double glove technique.   Coronary guiding shots were made using a 3.5 cm 6 [place] CLS guide catheter. Antithrombotic therapy, bivalirudin bolus followed by infusion, was begun and determined to be therapeutic by ACT. Antiplatelet therapy, 180 mg of Brilinta, was loaded.  A P[place]ater guidewire was used to cross the stenosis in the first obtuse marginal. Predilatation was performed using a 2.0 x 8 mm long; pliable balloon. Cutting balloon angioplasty was then performed using a 2.5 x 6 mm long balloon. A single inflation for 105 seconds was performed. The stenosis in the first obtuse marginal was reduced to less than 50% with TIMI grade 3 flow. Obstruction of the artery for the prolonged inflation did not produce the symptoms similar to those that led to admission. She experienced a mild sensation of pressure in the chest.  CONTRAST:  Total of 150 cc.  COMPLICATIONS:  None.    ANGIOGRAPHIC RESULTS:   95% ostial stenosis in the first obtuse marginal of the circumflex. Reduced to less than or equal to 50% after cutting balloon angioplasty with TIMI grade 3 flow. No evidence of dissection or contrast staining was noted to   IMPRESSIONS:  Cutting balloon angioplasty of the ostium of the first obtuse  marginal   RECOMMENDATION:  IV nitroglycerin x12 hours. Bivalirudin at 0.25 mg per kilogram per hour for 2 hours. Aspirin and Brilinta for one month. Further management per Dr. Mayford Knife.    Lesleigh Noe, MD 04/04/2012 5:36 PM

## 2012-04-04 NOTE — Progress Notes (Signed)
The patient is 89 and presented with an acute coronary syndrome with elevated cardiac markers and EKG abnormalities suggesting ischemia. At catheterization she was found to have a high-grade obstruction in the large first obtuse marginal no other lesions were found that could produce enzyme elevation. Discussed the clinical scenario with Dr. Mayford Knife and given the abnormal markers we have decided to proceed with PCI on the obtuse marginal which comes a sizable territory. I am unimpressed by her symptoms and not certain that they represent anginal complaints. I discussed this with the patient and her family. We have also discussed the higher than usual potential complication risk do to the lesion in the bifurcation stenosis. We discussed the risk of perforation, death, myocardial infarction, emergency surgery, bleeding, infection, contrast allergy, renal injury, among others. The patient understands the risks and is willing to proceed.

## 2012-04-04 NOTE — Interval H&P Note (Signed)
History and Physical Interval Note:  04/04/2012 1:19 PM  Cheryl [name]  has presented today for surgery, with the diagnosis of cp  The various methods of treatment have been discussed with the patient and family. After consideration of risks, benefits and other options for treatment, the patient has consented to  Procedure(s) (LRB) with comments: LEFT HEART CATHETERIZATION WITH CORONARY ANGIOGRAM (N/A) as a surgical intervention .  The patient's history has been reviewed, patient examined, no change in status, stable for surgery.  I have reviewed the patient's chart and labs.  Questions were answered to the patient's satisfaction.     TURNER,TRACI R

## 2012-04-04 NOTE — H&P (View-Only) (Signed)
Subjective:  Overall anxious but no further chest discomfort. Still having difficulty swallowing pills. See below. No further shortness of breath.  Objective:  Vital Signs in the last 24 hours: Temp:  [98.2 F (36.8 C)-98.6 F (37 C)] 98.2 F (36.8 C) (09/02 0350) Pulse Rate:  [76-95] 76  (09/02 0848) Resp:  [15-23] 18  (09/02 0350) BP: (105-149)/(62-102) 129/85 mmHg (09/02 0848) SpO2:  [94 %-99 %] 99 % (09/02 0350) Weight:  [85.5 kg (188 lb 7.9 oz)] 85.5 kg (188 lb 7.9 oz) (09/02 0350)  Intake/Output from previous day:     Physical Exam: General: Well developed, well nourished, in no acute distress. Head:  Normocephalic and atraumatic. Lungs: Clear to auscultation and percussion. Heart: Normal S1 and S2.  No murmur, rubs or gallops.  Abdomen: soft, non-tender, positive bowel sounds. Overweight Extremities: No clubbing or cyanosis. No edema. Neurologic: Alert and oriented x 3.    Lab Results:  Basename 04/03/12 0412 04/02/12 2331 04/02/12 2303  WBC 12.8* -- 8.9  HGB 13.9 14.6 --  PLT 268 -- 277    Basename 04/03/12 0412 04/02/12 2331  NA 140 142  K 3.0* 2.7*  CL 101 101  CO2 27 --  GLUCOSE 114* 109*  BUN 11 12  CREATININE 0.97 1.20*    Basename 04/03/12 0900 04/03/12 0415  TROPONINI 1.86* 2.81*   Hepatic Function Panel  Basename 04/03/12 0412  PROT 7.1  ALBUMIN 3.8  AST 24  ALT 19  ALKPHOS 25*  BILITOT 0.5  BILIDIR --  IBILI --    Basename 04/03/12 0412  CHOL 153   No results found for this basename: PROTIME in the last 72 hours  Imaging: Dg Chest 2 View  04/02/2012  *RADIOLOGY REPORT*  Clinical Data: Worsening shortness of breath.  Weakness.  CHEST - 2 VIEW  Comparison: 10/29/2011.  Findings: Left subclavian pacemaker leads are unchanged within the right atrium and right ventricle.  Heart size and mediastinal contours are stable.  There is mild central airway thickening without confluent airspace opacity, edema or significant pleural effusion.   Osseous structures appear unchanged.  Telemetry leads overlie the chest.  IMPRESSION: Mild central airway thickening suggesting bronchitis.  No evidence of pneumonia or edema.   Original Report Authenticated By: WILLIAM B. VEAZEY, M.D.    Personally viewed.   Telemetry: Occasional pacer spikes seen, no adverse arrhythmias Personally viewed.   EKG:  T wave inversion noted in precordial leads, V2, V3, possible ischemia, sinus rhythm   Assessment/Plan:   1. Non-ST elevation myocardial infarction- troponin has peaked at 2.81 and is now 1.86. EKG concerning for ischemic changes in the precordial leads. I have discussed with her and her daughter cardiac catheterization. N.p.o. past midnight. Risks and benefits including stroke, heart attack, death, renal impairment have been explained to the patient. She has not had a prior cardiac catheterization. She had a pacemaker placed earlier this year. Her symptoms are quite atypical. I explained to them that there are other reasons for possible elevation in troponin but cardiac catheterization was begun first to exclude coronary artery disease.  HPI per Dr. Hocchrein " She said she had an episode of discomfort behind her left ear. This happened at rest. She felt like she had difficulty swallowing and it needed to pop. She heaviness in her arms and also some heaviness in her legs and actually had to have help lifting them. She didn't have any visual or speech disturbances. She felt very "jittery and shaky" and generally weak. Her   symptoms resolved but then recurred. She said she had a similar episode a couple of weeks ago while in [place] vacationing at [place]. She was apparently admitted to the hospital but doesn't report any workup. She otherwise has been doing her usual activities which doesn't include exercise but does include doing some yard work. She's been able to trim her yard without chest pressure, neck or arm discomfort. She's not noticed any  palpitations and has had no presyncope or syncope. Tonight she had no chest pressure. She simply has felt like she is needed to burp. In the emergency room EKG had nonspecific changes But her troponin was slightly elevated. Her potassium was quite low and has been supplemented. She's not been taking her potassium supplementation at home as she has trouble with pills. Of note her family also report stress and anxiety. "     2. Abnormal EKG-as described above  3. Obesity-diet, weight loss.  4. Status post pacemaker-seems to be functioning perfectly.  5. Anxiety-her daughter brought this up with me as well. Asked her to discuss this further with Dr. Nnodi. I explained that it was natural to be anxious in the setting however if anxiety is overwhelming in normal situations that there are ways to help with this.  6. Hypokalemia-continue to replete. She is unable to tolerate a full pill. By mouth gets stuck. She has seen GI in the distant past. She may need to revisit. For now, crush potassium in applesauce. Liquid form is reasonable as well. She admits that she has not been taking it for the past 3 weeks.  7. Atrial fibrillation-currently in sinus rhythm on flecainide. If coronary artery disease is present, I would recommend changing to another antiarrhythmic. She is not on Coumadin. I will check an INR.    Cheryl [name] 04/03/2012, 11:23 AM     

## 2012-04-04 NOTE — Progress Notes (Signed)
ANTICOAGULATION CONSULT NOTE - Follow Up Consult  Pharmacy Consult for heparin Indication: chest pain/ACS  Labs:  Basename 04/03/12 2346 04/03/12 1626 04/03/12 1135 04/03/12 0900 04/03/12 0415 04/03/12 0412 04/02/12 2331 04/02/12 2303  HGB -- -- -- -- -- 13.9 14.6 --  HCT -- -- -- -- -- 39.7 43.0 41.0  PLT -- -- -- -- -- 268 -- 277  APTT -- -- -- -- -- -- -- --  LABPROT -- -- 13.7 -- -- -- -- --  INR -- -- 1.03 -- -- -- -- --  HEPARINUNFRC 0.73* 0.74* -- 0.23* -- -- -- --  CREATININE -- -- 0.98 -- -- 0.97 1.20* --  CKTOTAL -- -- -- -- -- -- -- --  CKMB -- -- -- -- -- -- -- --  TROPONINI -- 1.08* -- 1.86* 2.81* -- -- --    Assessment: 66yo female remains supratherapeutic on heparin after rate decrease with no movement of level.  Goal of Therapy:  Heparin level 0.3-0.7 units/ml   Plan:  Will decrease heparin gtt further to 1000 units/hr and check level in 6hr.  Colleen Can PharmD BCPS 04/04/2012,12:36 AM

## 2012-04-05 DIAGNOSIS — I2510 Atherosclerotic heart disease of native coronary artery without angina pectoris: Secondary | ICD-10-CM | POA: Diagnosis not present

## 2012-04-05 DIAGNOSIS — E785 Hyperlipidemia, unspecified: Secondary | ICD-10-CM | POA: Diagnosis not present

## 2012-04-05 DIAGNOSIS — I214 Non-ST elevation (NSTEMI) myocardial infarction: Secondary | ICD-10-CM | POA: Diagnosis not present

## 2012-04-05 DIAGNOSIS — I10 Essential (primary) hypertension: Secondary | ICD-10-CM | POA: Diagnosis not present

## 2012-04-05 LAB — BASIC METABOLIC PANEL
BUN: 13 mg/dL (ref 6–23)
BUN: 11 mg/dL (ref 6–23)
CO2: 27 mEq/L (ref 19–32)
Chloride: 100 mEq/L (ref 96–112)
Creatinine, Ser: 1.07 mg/dL (ref 0.50–1.10)
Glucose, Bld: 81 mg/dL (ref 70–99)
Glucose, Bld: 111 mg/dL — ABNORMAL HIGH (ref 70–99)
Potassium: 3.2 mEq/L — ABNORMAL LOW (ref 3.5–5.1)
Potassium: 4.1 mEq/L (ref 3.5–5.1)
Sodium: 139 mEq/L (ref 135–145)

## 2012-04-05 LAB — CBC
HCT: 36.1 % (ref 36.0–46.0)
Hemoglobin: 12.1 g/dL (ref 12.0–15.0)
MCH: 28 pg (ref 26.0–34.0)
MCHC: 33.5 g/dL (ref 30.0–36.0)
RBC: 4.32 MIL/uL (ref 3.87–5.11)

## 2012-04-05 MED ORDER — TICAGRELOR 90 MG PO TABS: 90.0000 mg | ORAL_TABLET | Freq: Two times a day (BID) | ORAL | Status: DC

## 2012-04-05 MED ORDER — POTASSIUM CHLORIDE CRYS ER 20 MEQ PO TBCR
40.0000 meq | EXTENDED_RELEASE_TABLET | ORAL | Status: AC
  Administered 2012-04-05: 16:00:00 40 meq via ORAL
  Filled 2012-04-05: qty 2

## 2012-04-05 MED ORDER — POTASSIUM CHLORIDE ER 10 MEQ PO TBCR: 20.0000 meq | EXTENDED_RELEASE_TABLET | Freq: Two times a day (BID) | ORAL | Status: DC

## 2012-04-05 MED ORDER — NITROGLYCERIN 0.4 MG SL SUBL: 0.4000 mg | SUBLINGUAL_TABLET | SUBLINGUAL | Status: DC | PRN

## 2012-04-05 MED ORDER — POTASSIUM CHLORIDE CRYS ER 20 MEQ PO TBCR
40.0000 meq | EXTENDED_RELEASE_TABLET | Freq: Two times a day (BID) | ORAL | Status: DC
  Administered 2012-04-05: 40 meq via ORAL
  Filled 2012-04-05: qty 2

## 2012-04-05 MED FILL — Dextrose Inj 5%: INTRAVENOUS | Qty: 50 | Status: AC

## 2012-04-05 NOTE — Care Management Note (Signed)
    Page 1 of 1   04/05/2012     11:42:42 AM   CARE MANAGEMENT NOTE 04/05/2012  Patient:  Cheryl, [name]   Account Number:  [id number]  Date Initiated:  04/05/2012  Documentation initiated by:  Fransico Michael  Subjective/Objective Assessment:   admitted on 04/02/12 with non stemi     Action/Plan:   prior to admission, patient lived at home with spouse.   Anticipated DC Date:  04/05/2012   Anticipated DC Plan:  HOME/SELF CARE      DC Planning Services  CM consult  Medication Assistance      Choice offered to / List presented to:             Status of service:  Completed, signed off Medicare Important Message given?   (If response is "NO", the following Medicare IM given date fields will be blank) Date Medicare IM given:   Date Additional Medicare IM given:    Discharge Disposition:  HOME/SELF CARE  Per UR Regulation:  Reviewed for med. necessity/level of care/duration of stay  If discussed at Long Length of Stay Meetings, dates discussed:    Comments:  04/05/12-1026-J.Kurstin Dimarzo,RN,BSN [phone]      In to see patient regarding pending discharge on Brilinta. Free 30 day supply card given. Discussed discounted copay with card. Answered questions. No further needs identified. Anticipated discharge to home today.

## 2012-04-05 NOTE — Discharge Summary (Signed)
Patient ID: Cheryl [name] MRN: [id number] DOB/AGE: [birth year] 66 y.o.  Admit date: 04/02/2012 Discharge date: 04/05/2012  Primary Discharge Diagnosis NSTEMI Secondary Discharge Diagnosis  CAD s/p cutting balloon angioplasty to the OM1  Hypertension  Dyslipidemia  GERD  Anemia  Blood transfusion  Hiatal Hernia  Arthritis  PAF  Tachy-brady syndrome s/p PPM    Significant Diagnostic Studies: Cardiac catheterization with cutting balloon angioplasty  PERCUTANEOUS CORONARY INTERVENTION  Cheryl [name] is a 66 y.o. female  INDICATION: Acute coronary syndrome with elevated troponin cardiac markers, and high-grade stenosis in the first obtuse marginal.  PROCEDURE: Cutting balloon angioplasty of obtuse marginal #1  CONSENT:  The risks, benefits, and details of the procedure were explained to the patient. Risks including death, MI, stroke, bleeding, limb ischemia, renal failure and allergy were described and accepted by the patient. Informed written consent was obtained prior to proceeding.  PROCEDURE TECHNIQUE: After Xylocaine anesthesia a 6 French sheath was exchanged for the 5 [place] diagnostic sheath in the right femoral artery using double glove technique. Coronary guiding shots were made using a 3.5 cm 6 [place] CLS guide catheter. Antithrombotic therapy, bivalirudin bolus followed by infusion, was begun and determined to be therapeutic by ACT. Antiplatelet therapy, 180 mg of Brilinta, was loaded.  A [place] guidewire was used to cross the stenosis in the first obtuse marginal. Predilatation was performed using a 2.0 x 8 mm long; pliable balloon. Cutting balloon angioplasty was then performed using a 2.5 x 6 mm long balloon. A single inflation for 105 seconds was performed. The stenosis in the first obtuse marginal was reduced to less than 50% with TIMI grade 3 flow. Obstruction of the artery for the prolonged inflation did not produce the symptoms similar to those that led to admission. She  experienced a mild sensation of pressure in the chest.  CONTRAST: Total of 150 cc.  COMPLICATIONS: None.  ANGIOGRAPHIC RESULTS: 95% ostial stenosis in the first obtuse marginal of the circumflex. Reduced to less than or equal to 50% after cutting balloon angioplasty with TIMI grade 3 flow. No evidence of dissection or contrast staining was noted to  IMPRESSIONS: Cutting balloon angioplasty of the ostium of the first obtuse marginal  RECOMMENDATION: IV nitroglycerin x12 hours. Bivalirudin at 0.25 mg per kilogram per hour for 2 hours. Aspirin and Brilinta for one month. Further management per Dr. Mayford Knife.  Cheryl Noe, MD  04/04/2012   Consults: None  Hospital Course: This is a 66yo WF with a history of PAF and tachybrady syndrome s/p PPM and on flecainide for PAF suppression, GERD, HTN and dyslipidemia who presented to the ER with complaints of arm heaviness.  She states that she had been outside for about 5 hours in the heat prior to symptom onset.  SHe had pain behind her left ear and then had problems swallowing and then the heaviness in her arms and legs occurred with an overall jittery and shaky feeling.  Her symptoms resolved but then reoccurred and she went to the ER.  She denies any chest pain but in the ER was noted to have an elevated Troponin.  She was admitted and her Troponin increased to 2.81 c/w NSTEMI.  She was also noted to be hypokalemic and had not been taking her potassium at home due to inability to swallow the tablets.   Her EKG showed ischemic changes in the precordial leads with a prolonged QTc interval which was felt to possibly be due to hypokalemia.  She underwent cardiac  cath on 9/3 showing 90% stenosis of large ostial OM1.  She subsequently underwent cutting balloon angioplasty and was started on Brilinta and ASA.  On day of discharge her potassium was low at 3.2 and was supplemented.  She was discharged home on potassium.  On discharge she was ambulating without problems  and groin site was well healed with no hematoma or ecchymosis with intact pulses in posterior tibial arteries bilaterally. Due to CAD her flecainide was discontinued.     Discharge Exam:   WD, WN, WF in NAD HEENT:  Benign LUNGS:  CTA bilaterally COR:  RRR with no M/R/G ABD:  Soft, NT, ND with active BS EXT:  No C/E/E and no evidence of hematoma or ecchymosis of right groin cath site with 2+ pulses in posterior tibial pulses Labs:   Lab Results  Component Value Date   WBC 5.7 04/05/2012   HGB 12.1 04/05/2012   HCT 36.1 04/05/2012   MCV 83.6 04/05/2012   PLT 221 04/05/2012    Lab 04/05/12 0555 04/03/12 0412  NA 139 --  K 3.2* --  CL 102 --  CO2 27 --  BUN 13 --  CREATININE 1.16* --  CALCIUM 8.9 --  PROT -- 7.1  BILITOT -- 0.5  ALKPHOS -- 25*  ALT -- 19  AST -- 24  GLUCOSE 81 --   Lab Results  Component Value Date   TROPONINI 1.08* 04/03/2012    Lab Results  Component Value Date   CHOL 153 04/03/2012   Lab Results  Component Value Date   HDL 70 04/03/2012   Lab Results  Component Value Date   LDLCALC 62 04/03/2012   Lab Results  Component Value Date   TRIG 105 04/03/2012   Lab Results  Component Value Date   CHOLHDL 2.2 04/03/2012   No results found for this basename: LDLDIRECT      Radiology:  *RADIOLOGY REPORT*  Clinical Data: Worsening shortness of breath. Weakness.  CHEST - 2 VIEW  Comparison: 10/29/2011.  Findings: Left subclavian pacemaker leads are unchanged within the  right atrium and right ventricle. Heart size and mediastinal  contours are stable. There is mild central airway thickening  without confluent airspace opacity, edema or significant pleural  effusion. Osseous structures appear unchanged. Telemetry leads  overlie the chest.  IMPRESSION:  Mild central airway thickening suggesting bronchitis. No evidence  of pneumonia or edema.  Original Report Authenticated By: Gerrianne Scale, M.D.  EKG:  NSR with prolonged QTc, anterior ischemia  FOLLOW UP  PLANS AND APPOINTMENTS Discharge Orders    Future Orders Please Complete By Expires   Amb Referral to Cardiac Rehabilitation      Diet - low sodium heart healthy      Increase activity slowly      Driving Restrictions      Comments:   No driving for 24 hours   Lifting restrictions      Comments:   No lifting more than 10 pounds for 5 days   Call MD for:  temperature >100.4      Call MD for:  persistant nausea and vomiting      Call MD for:  severe uncontrolled pain      Call MD for:  redness, tenderness, or signs of infection (pain, swelling, redness, odor or green/yellow discharge around incision site)      Call MD for:  difficulty breathing, headache or visual disturbances      Call MD for:  persistant dizziness or light-headedness  Call MD for:  extreme fatigue        Medication List  As of 04/05/2012  5:30 PM   STOP taking these medications         flecainide 100 MG tablet         TAKE these medications         aspirin 81 MG tablet   Take 81 mg by mouth daily.      carvedilol 12.5 MG tablet   Commonly known as: COREG   Take 1 tablet (12.5 mg total) by mouth 2 (two) times daily.      CRESTOR 10 MG tablet   Generic drug: rosuvastatin   Take 10 mg by mouth Daily.      levothyroxine 100 MCG tablet   Commonly known as: SYNTHROID, LEVOTHROID   Take 100 mcg by mouth Daily.      nitroGLYCERIN 0.4 MG SL tablet   Commonly known as: NITROSTAT   Place 1 tablet (0.4 mg total) under the tongue every 5 (five) minutes x 3 doses as needed for chest pain.      potassium chloride 10 MEQ tablet   Commonly known as: K-DUR   Take 2 tablets (20 mEq total) by mouth 2 (two) times daily.      ranitidine 75 MG tablet   Commonly known as: ZANTAC   Take 75 mg by mouth daily.      Ticagrelor 90 MG Tabs tablet   Commonly known as: BRILINTA   Take 1 tablet (90 mg total) by mouth 2 (two) times daily.      triamterene-hydrochlorothiazide 75-50 MG per tablet   Commonly known as:  MAXZIDE   Take 75 mg by mouth Daily.           Follow-up Information    Follow up with Quintella Reichert, MD on 04/20/2012. (09:00am)    Contact information:   [address] [place] [address] [phone]          BRING ALL MEDICATIONS WITH YOU TO FOLLOW UP APPOINTMENTS  Time spent with patient to include physician time: 45 minutes Signed: Syrita Dovel R 04/05/2012, 5:30 PM

## 2012-04-05 NOTE — Progress Notes (Signed)
CARDIAC REHAB PHASE I   PRE:  Rate/Rhythm: 66 pod    BP: sitting 114/58    SaO2:   MODE:  Ambulation: 750 ft   POST:  Rate/Rhythm: 87 pod    BP: sitting 152/59     SaO2:   Tolerated well but still notes some SOB walking. No other c/o. Ed completed and interested in CRPII. Will send referral to [facility] CRPII. Discussed decreasing anxiety as well.  [id number]  Harriet Masson CES, ACSM

## 2012-04-06 DIAGNOSIS — I4891 Unspecified atrial fibrillation: Secondary | ICD-10-CM | POA: Diagnosis not present

## 2012-04-06 DIAGNOSIS — I2510 Atherosclerotic heart disease of native coronary artery without angina pectoris: Secondary | ICD-10-CM | POA: Diagnosis not present

## 2012-04-06 DIAGNOSIS — E876 Hypokalemia: Secondary | ICD-10-CM | POA: Diagnosis not present

## 2012-04-06 NOTE — Progress Notes (Signed)
Retro ur ins review. 

## 2012-04-17 DIAGNOSIS — I4891 Unspecified atrial fibrillation: Secondary | ICD-10-CM | POA: Diagnosis not present

## 2012-04-17 DIAGNOSIS — E876 Hypokalemia: Secondary | ICD-10-CM | POA: Diagnosis not present

## 2012-04-17 DIAGNOSIS — I2510 Atherosclerotic heart disease of native coronary artery without angina pectoris: Secondary | ICD-10-CM | POA: Diagnosis not present

## 2012-04-17 DIAGNOSIS — I10 Essential (primary) hypertension: Secondary | ICD-10-CM | POA: Diagnosis not present

## 2012-04-17 DIAGNOSIS — R002 Palpitations: Secondary | ICD-10-CM | POA: Diagnosis not present

## 2012-04-24 ENCOUNTER — Inpatient Hospital Stay (HOSPITAL_COMMUNITY)
Admission: EM | Admit: 2012-04-24 | Discharge: 2012-04-26 | DRG: 310 | Disposition: A | Discharge status: Home / Self Care | Payer: Medicare Other | Attending: Cardiology | Admitting: Cardiology

## 2012-04-24 ENCOUNTER — Encounter (HOSPITAL_COMMUNITY): Payer: Self-pay | Admitting: Emergency Medicine

## 2012-04-24 ENCOUNTER — Emergency Department (HOSPITAL_COMMUNITY): Payer: Medicare Other

## 2012-04-24 DIAGNOSIS — R0602 Shortness of breath: Secondary | ICD-10-CM | POA: Diagnosis not present

## 2012-04-24 DIAGNOSIS — R002 Palpitations: Secondary | ICD-10-CM

## 2012-04-24 DIAGNOSIS — E876 Hypokalemia: Secondary | ICD-10-CM | POA: Diagnosis present

## 2012-04-24 DIAGNOSIS — I119 Hypertensive heart disease without heart failure: Secondary | ICD-10-CM | POA: Diagnosis present

## 2012-04-24 DIAGNOSIS — M129 Arthropathy, unspecified: Secondary | ICD-10-CM | POA: Diagnosis present

## 2012-04-24 DIAGNOSIS — Z7982 Long term (current) use of aspirin: Secondary | ICD-10-CM

## 2012-04-24 DIAGNOSIS — I491 Atrial premature depolarization: Secondary | ICD-10-CM | POA: Diagnosis present

## 2012-04-24 DIAGNOSIS — Z885 Allergy status to narcotic agent status: Secondary | ICD-10-CM | POA: Diagnosis not present

## 2012-04-24 DIAGNOSIS — I4891 Unspecified atrial fibrillation: Secondary | ICD-10-CM | POA: Diagnosis not present

## 2012-04-24 DIAGNOSIS — R200 Anesthesia of skin: Secondary | ICD-10-CM

## 2012-04-24 DIAGNOSIS — I480 Paroxysmal atrial fibrillation: Secondary | ICD-10-CM

## 2012-04-24 DIAGNOSIS — F411 Generalized anxiety disorder: Secondary | ICD-10-CM | POA: Diagnosis present

## 2012-04-24 DIAGNOSIS — Z23 Encounter for immunization: Secondary | ICD-10-CM

## 2012-04-24 DIAGNOSIS — Z9861 Coronary angioplasty status: Secondary | ICD-10-CM | POA: Diagnosis not present

## 2012-04-24 DIAGNOSIS — Z8249 Family history of ischemic heart disease and other diseases of the circulatory system: Secondary | ICD-10-CM

## 2012-04-24 DIAGNOSIS — Z79899 Other long term (current) drug therapy: Secondary | ICD-10-CM

## 2012-04-24 DIAGNOSIS — E669 Obesity, unspecified: Secondary | ICD-10-CM | POA: Diagnosis present

## 2012-04-24 DIAGNOSIS — I495 Sick sinus syndrome: Secondary | ICD-10-CM | POA: Diagnosis present

## 2012-04-24 DIAGNOSIS — Z7901 Long term (current) use of anticoagulants: Secondary | ICD-10-CM | POA: Diagnosis not present

## 2012-04-24 DIAGNOSIS — Z950 Presence of cardiac pacemaker: Secondary | ICD-10-CM | POA: Diagnosis not present

## 2012-04-24 DIAGNOSIS — E039 Hypothyroidism, unspecified: Secondary | ICD-10-CM | POA: Diagnosis present

## 2012-04-24 DIAGNOSIS — Y92009 Unspecified place in unspecified non-institutional (private) residence as the place of occurrence of the external cause: Secondary | ICD-10-CM

## 2012-04-24 DIAGNOSIS — E785 Hyperlipidemia, unspecified: Secondary | ICD-10-CM | POA: Diagnosis present

## 2012-04-24 DIAGNOSIS — Z6832 Body mass index (BMI) 32.0-32.9, adult: Secondary | ICD-10-CM

## 2012-04-24 DIAGNOSIS — I4819 Other persistent atrial fibrillation: Secondary | ICD-10-CM | POA: Diagnosis present

## 2012-04-24 DIAGNOSIS — K219 Gastro-esophageal reflux disease without esophagitis: Secondary | ICD-10-CM | POA: Diagnosis present

## 2012-04-24 DIAGNOSIS — T502X5A Adverse effect of carbonic-anhydrase inhibitors, benzothiadiazides and other diuretics, initial encounter: Secondary | ICD-10-CM | POA: Diagnosis present

## 2012-04-24 DIAGNOSIS — I2510 Atherosclerotic heart disease of native coronary artery without angina pectoris: Secondary | ICD-10-CM | POA: Diagnosis present

## 2012-04-24 DIAGNOSIS — I4892 Unspecified atrial flutter: Secondary | ICD-10-CM | POA: Diagnosis not present

## 2012-04-24 DIAGNOSIS — Z7902 Long term (current) use of antithrombotics/antiplatelets: Secondary | ICD-10-CM | POA: Diagnosis not present

## 2012-04-24 DIAGNOSIS — I499 Cardiac arrhythmia, unspecified: Secondary | ICD-10-CM | POA: Diagnosis not present

## 2012-04-24 DIAGNOSIS — Z87891 Personal history of nicotine dependence: Secondary | ICD-10-CM | POA: Diagnosis not present

## 2012-04-24 DIAGNOSIS — I214 Non-ST elevation (NSTEMI) myocardial infarction: Secondary | ICD-10-CM | POA: Diagnosis present

## 2012-04-24 LAB — BASIC METABOLIC PANEL
BUN: 18 mg/dL (ref 6–23)
CO2: 27 mEq/L (ref 19–32)
Calcium: 10.1 mg/dL (ref 8.4–10.5)
Creatinine, Ser: 1.05 mg/dL (ref 0.50–1.10)
Glucose, Bld: 104 mg/dL — ABNORMAL HIGH (ref 70–99)

## 2012-04-24 LAB — COMPREHENSIVE METABOLIC PANEL
Albumin: 4.2 g/dL (ref 3.5–5.2)
BUN: 18 mg/dL (ref 6–23)
Calcium: 10.2 mg/dL (ref 8.4–10.5)
Creatinine, Ser: 1.15 mg/dL — ABNORMAL HIGH (ref 0.50–1.10)
GFR calc Af Amer: 57 mL/min — ABNORMAL LOW (ref >90)
Glucose, Bld: 82 mg/dL (ref 70–99)
Total Protein: 7.9 g/dL (ref 6.0–8.3)

## 2012-04-24 LAB — CBC WITH DIFFERENTIAL/PLATELET
Eosinophils Relative: 1 % (ref 0–5)
HCT: 42 % (ref 36.0–46.0)
Lymphocytes Relative: 21 % (ref 12–46)
Lymphs Abs: 2 10*3/uL (ref 0.7–4.0)
MCV: 81.4 fL (ref 78.0–100.0)
Monocytes Absolute: 0.7 10*3/uL (ref 0.1–1.0)
RBC: 5.16 MIL/uL — ABNORMAL HIGH (ref 3.87–5.11)
WBC: 9.8 10*3/uL (ref 4.0–10.5)

## 2012-04-24 LAB — MAGNESIUM: Magnesium: 1.9 mg/dL (ref 1.5–2.5)

## 2012-04-24 MED ORDER — LEVOTHYROXINE SODIUM 100 MCG PO TABS
100.0000 ug | ORAL_TABLET | Freq: Every day | ORAL | Status: DC
  Administered 2012-04-25 – 2012-04-26 (×2): 100 ug via ORAL
  Filled 2012-04-24 (×3): qty 1

## 2012-04-24 MED ORDER — ONDANSETRON HCL 4 MG/2ML IJ SOLN: 4.0000 mg | Freq: Four times a day (QID) | INTRAMUSCULAR | Status: DC | PRN

## 2012-04-24 MED ORDER — NITROGLYCERIN 0.4 MG SL SUBL: 0.4000 mg | SUBLINGUAL_TABLET | SUBLINGUAL | Status: DC | PRN

## 2012-04-24 MED ORDER — FAMOTIDINE 20 MG PO TABS
20.0000 mg | ORAL_TABLET | Freq: Every day | ORAL | Status: DC
  Administered 2012-04-24 – 2012-04-26 (×3): 20 mg via ORAL
  Filled 2012-04-24 (×3): qty 1

## 2012-04-24 MED ORDER — ACETAMINOPHEN 325 MG PO TABS
650.0000 mg | ORAL_TABLET | ORAL | Status: DC | PRN
  Administered 2012-04-25: 650 mg via ORAL
  Filled 2012-04-24 (×2): qty 2

## 2012-04-24 MED ORDER — GI COCKTAIL ~~LOC~~
30.0000 mL | Freq: Once | ORAL | Status: AC
  Administered 2012-04-24: 30 mL via ORAL
  Filled 2012-04-24: qty 30

## 2012-04-24 MED ORDER — SODIUM CHLORIDE 0.9 % IJ SOLN: 3.0000 mL | INTRAMUSCULAR | Status: DC | PRN

## 2012-04-24 MED ORDER — POTASSIUM CHLORIDE CRYS ER 20 MEQ PO TBCR
40.0000 meq | EXTENDED_RELEASE_TABLET | Freq: Once | ORAL | Status: AC
  Administered 2012-04-24: 40 meq via ORAL
  Filled 2012-04-24: qty 2

## 2012-04-24 MED ORDER — POTASSIUM CHLORIDE 20 MEQ/15ML (10%) PO LIQD
40.0000 meq | Freq: Once | ORAL | Status: DC
Start: 2012-04-24 — End: 2012-04-26

## 2012-04-24 MED ORDER — DILTIAZEM HCL 50 MG/10ML IV SOLN
20.0000 mg | Freq: Once | INTRAVENOUS | Status: AC
  Administered 2012-04-24: 20 mg via INTRAVENOUS

## 2012-04-24 MED ORDER — HEPARIN BOLUS VIA INFUSION
4000.0000 [IU] | Freq: Once | INTRAVENOUS | Status: AC
  Administered 2012-04-24: 4000 [IU] via INTRAVENOUS

## 2012-04-24 MED ORDER — SODIUM CHLORIDE 0.9 % IJ SOLN
3.0000 mL | Freq: Two times a day (BID) | INTRAMUSCULAR | Status: DC
  Administered 2012-04-24 – 2012-04-25 (×3): 3 mL via INTRAVENOUS

## 2012-04-24 MED ORDER — ATORVASTATIN CALCIUM 20 MG PO TABS: 20.0000 mg | ORAL_TABLET | Freq: Every day | ORAL | Status: DC

## 2012-04-24 MED ORDER — ATORVASTATIN CALCIUM 20 MG PO TABS
20.0000 mg | ORAL_TABLET | Freq: Every day | ORAL | Status: DC
  Administered 2012-04-24 – 2012-04-25 (×2): 20 mg via ORAL
  Filled 2012-04-24 (×3): qty 1

## 2012-04-24 MED ORDER — TICAGRELOR 90 MG PO TABS
90.0000 mg | ORAL_TABLET | Freq: Two times a day (BID) | ORAL | Status: DC
  Administered 2012-04-24 – 2012-04-26 (×4): 90 mg via ORAL
  Filled 2012-04-24 (×5): qty 1

## 2012-04-24 MED ORDER — DEXTROSE 5 % IV SOLN
5.0000 mg/h | INTRAVENOUS | Status: DC
  Administered 2012-04-24: 5 mg/h via INTRAVENOUS
  Administered 2012-04-25 (×2): 10 mg/h via INTRAVENOUS
  Filled 2012-04-24 (×4): qty 100

## 2012-04-24 MED ORDER — HEPARIN (PORCINE) IN NACL 100-0.45 UNIT/ML-% IJ SOLN
1000.0000 [IU]/h | INTRAMUSCULAR | Status: DC
  Administered 2012-04-24: 1000 [IU]/h via INTRAVENOUS
  Filled 2012-04-24 (×3): qty 250

## 2012-04-24 MED ORDER — ASPIRIN 81 MG PO TABS: 81.0000 mg | ORAL_TABLET | Freq: Every day | ORAL | Status: DC

## 2012-04-24 MED ORDER — METOPROLOL SUCCINATE ER 50 MG PO TB24
50.0000 mg | ORAL_TABLET | Freq: Every day | ORAL | Status: DC
Start: 2012-04-24 — End: 2012-04-26
  Administered 2012-04-25 – 2012-04-26 (×2): 50 mg via ORAL
  Filled 2012-04-24 (×3): qty 1

## 2012-04-24 MED ORDER — SODIUM CHLORIDE 0.9 % IV SOLN: 250.0000 mL | INTRAVENOUS | Status: DC | PRN

## 2012-04-24 MED ORDER — ASPIRIN 81 MG PO CHEW
81.0000 mg | CHEWABLE_TABLET | Freq: Every day | ORAL | Status: DC
  Administered 2012-04-24 – 2012-04-26 (×3): 81 mg via ORAL
  Filled 2012-04-24 (×3): qty 1

## 2012-04-24 MED ORDER — POTASSIUM CHLORIDE ER 10 MEQ PO TBCR
20.0000 meq | EXTENDED_RELEASE_TABLET | Freq: Two times a day (BID) | ORAL | Status: DC
  Administered 2012-04-24 – 2012-04-26 (×4): 20 meq via ORAL
  Filled 2012-04-24 (×5): qty 2

## 2012-04-24 NOTE — ED Notes (Signed)
Per EMS: pt from home c/o palpitations today; pt initially in afib and then found to be in aflutter; HR 142 bpm; pt denies any complaint other than palpitations; IV 22g L FA

## 2012-04-24 NOTE — H&P (Signed)
History and Physical   Admit date: 04/24/2012 Name:  Cheryl [name] Medical record number: [id number] DOB/Age:  [birth year]  66 y.o. female  Referring Physician:  [facility]  Primary Cardiologist:  Dr. Carolanne Grumbling   Primary Physician:  Dr. Ihor Dow   Chief complaint/reason for admission:  Palpitations and atrial fibrillation  HPI:  This 66 year-old female has a prior history of hypertension and hyperlipidemia and has had paroxysmal atrial fibrillation. Earlier this year she had significant posttermination pauses and had a [employer] Jude[employer] pacemaker implanted by Dr. Ladona Ridgel. At the time she was offered the option of taking Tikosyn but decided to have a pacemaker instead. Her atrial fibrillation subsequently  was able to be suppressed with flecainide and metoprolol but she recently was admitted to the hospital earlier this month with chest discomfort with positive troponins. She was found to have an ostial stenosis in the first marginal branch and underwent cutting balloon angioplasty by Dr. Katrinka Blazing on September 3.  Since discharge she has complained of dyspnea that she attributes to [place]. In addition she has continued to have atrial fibrillation because she was taken off her for Flecainidet because of the diagnosis of coronary artery disease. This morning she had a brief episode of atrial fibrillation and then this afternoon had a very severe episode of atrial fibrillation prompting a visit to the emergency room here. She was begun on intravenous diltiazem and subsequently has converted back to sinus rhythm but has had a lot of PACs. She has not really had any chest discomfort of significance although she has atypical discomfort as well as indigestion.  She notes that at the extender at Dr. Norris Cross office and asked her to take an extra metoprolol for atrial fibrillation when she was seen there for some increasing episodes recently but that she was afraid to do so.    Past Medical History    Diagnosis Date  . HTN (hypertension)   . Hypercholesteremia   . GERD (gastroesophageal reflux disease)   . Hypothyroidism   . Anemia   . Blood transfusion     " no reaction "  . H/O hiatal hernia   . Arthritis   . PAF (paroxysmal atrial fibrillation)   . Tachy-brady syndrome     a. post-termination pauses in setting of PAF;  b. 10/28/11 - [address]. Jude Dual Chamber PPM SN [id number]        Past Surgical History  Procedure Date  . Permanent pacemaker 10/28/2011  . Breast surgery     Benign lump   Allergies: is allergic to codeine and tape.   Medications: Prior to Admission medications   Medication Sig Start Date End Date Taking? Authorizing Provider  aspirin 81 MG tablet Take 81 mg by mouth daily.   Yes Historical Provider, MD  CRESTOR 10 MG tablet Take 10 mg by mouth Daily. 06/28/11  Yes Historical Provider, MD  levothyroxine (SYNTHROID, LEVOTHROID) 100 MCG tablet Take 100 mcg by mouth Daily. 07/16/11  Yes Historical Provider, MD  metoprolol succinate (TOPROL-XL) 50 MG 24 hr tablet Take 50 mg by mouth daily. Take with or immediately following a meal.   Yes Historical Provider, MD  nitroGLYCERIN (NITROSTAT) 0.4 MG SL tablet Place 1 tablet (0.4 mg total) under the tongue every 5 (five) minutes x 3 doses as needed for chest pain. 04/05/12 04/05/13 Yes Quintella Reichert, MD  potassium chloride (K-DUR) 10 MEQ tablet Take 2 tablets (20 mEq total) by mouth 2 (two) times daily. 04/05/12 04/05/13 Yes  Quintella Reichert, MD  ranitidine (ZANTAC) 75 MG tablet Take 75 mg by mouth daily.   Yes Historical Provider, MD  Ticagrelor (BRILINTA) 90 MG TABS tablet Take 1 tablet (90 mg total) by mouth 2 (two) times daily. 04/05/12  Yes Quintella Reichert, MD  triamterene-hydrochlorothiazide (MAXZIDE) 75-50 MG per tablet Take 75 mg by mouth Daily. 09/06/11  Yes Historical Provider, MD    Family History:  Family Status  Relation Status Death Age  . Mother Deceased 59    died of cancer, history of MI at age 26  . Father Deceased  81    died of MI  . Sister Alive     Social History:   reports that she quit smoking about 34 years ago. She has never used smokeless tobacco. She reports that she does not drink alcohol or use illicit drugs.   History   Social History Narrative   Lives with husband.  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     Review of Systems: She tends to have significant anxiety and stress. She was quite anxious about her issues today. She has frequent indigestion. He has an occasional episodes of urinary incontinence. She has difficulty sleeping at night. She has complained of dyspnea since been started on Brilanta. Other than as noted above the remainder review of systems is unremarkable.   Physical Exam: BP 128/77  Pulse 91  Temp 98.2 F (36.8 C) (Oral)  Resp 18  SpO2 99% General appearance: alert, cooperative, appears stated age and Anxious Head: Normocephalic, without obvious abnormality, atraumatic Eyes: conjunctivae/corneas clear. PERRL, EOM's intact. Fundi not examined  Neck: no adenopathy, no carotid bruit, no JVD and supple, symmetrical, trachea midline Lungs: clear to auscultation bilaterally Heart: regular rate and rhythm, S1, S2 normal, no murmur, click, rub or gallop Abdomen: soft, non-tender; bowel sounds normal; no masses,  no organomegaly Pelvic: deferred Extremities: Bilateral venous stasis changes noted, trace edema Pulses: 2+ and symmetric Skin: Skin color, texture, turgor normal. No rashes or lesions Neurologic: Grossly normal  Labs: CBC  Basename 04/24/12 1510  WBC 9.8  RBC 5.16*  HGB 15.0  HCT 42.0  PLT 307  MCV 81.4  MCH 29.1  MCHC 35.7  RDW 13.2  LYMPHSABS 2.0  MONOABS 0.7  EOSABS 0.1  BASOSABS 0.1   CMP   Basename 04/24/12 1510  NA 142  K 3.2*  CL 99  CO2 27  GLUCOSE 104*  BUN 18  CREATININE 1.05  CALCIUM 10.1  PROT --  ALBUMIN --  AST --  ALT --  ALKPHOS --  BILITOT --  GFRNONAA 55*  GFRAA 63*   Cardiac Panel (last 3  results)  Basename 04/24/12 1510  CKTOTAL --  CKMB --  TROPONINI <0.30  RELINDX --   Thyroid  Lab Results  Component Value Date   TSH 3.322 04/03/2012    EKG: Atrial fibrillation with rapid ventricular response, nonspecific ST and T-wave changes.  Radiology: Portable chest x-ray shows no definite acute findings but questionable density at left base that may be artifact.   IMPRESSIONS: 1. Paroxysmal atrial fibrillation 2. Coronary artery disease with previous angioplasty of the first marginal branch 3. Hyperlipidemia 4. Hypertensive heart disease 5. Obesity 6. Anxiety 7. Esophageal reflux 8. Dyspnea that may be due to Brilanta PLAN: The patient has converted to normal sinus rhythm on diltiazem intravenously. She is continuing to have PACs. She will be anticoagulated overnight with heparin and will be continued on intravenous diltiazem. Her potassium will be  repleted. Further management of atrial fibrillation per Dr. Mayford Knife. I discussed options of treatment with antiarrhythmics such as Tikosyn or consideration of catheter ablation since she has already failed one antiarrhythmic.  Signed: Darden Palmer MD [facility] Cardiology  04/24/2012, 6:58 PM

## 2012-04-24 NOTE — Progress Notes (Signed)
ANTICOAGULATION CONSULT NOTE - Initial Consult  Pharmacy Consult for  Heparin Indication: chest pain/ACS  Allergies  Allergen Reactions  . Codeine     nausea  . Tape     Rash     Patient Measurements: Height: 5\' 3"  (160 cm) Weight: 180 lb 8 oz (81.874 kg) (scale B) IBW/kg (Calculated) : 52.4  Heparin Dosing Weight: 71.7 kg  Vital Signs: Temp: 98.2 F (36.8 C) (09/23 2030) Temp src: Oral (09/23 2030) BP: 124/79 mmHg (09/23 2030) Pulse Rate: 72  (09/23 2030)  Labs:  Basename 04/24/12 1914 04/24/12 1510  HGB -- 15.0  HCT -- 42.0  PLT -- 307  APTT -- --  LABPROT -- 12.7  INR -- 0.96  HEPARINUNFRC -- --  CREATININE 1.15* 1.05  CKTOTAL -- --  CKMB -- --  TROPONINI -- <0.30    Estimated Creatinine Clearance: 49.4 ml/min (by C-G formula based on Cr of 1.15).   Medical History: Past Medical History  Diagnosis Date  . HTN (hypertension)   . Hypercholesteremia   . GERD (gastroesophageal reflux disease)   . Hypothyroidism   . Anemia   . Blood transfusion     " no reaction "  . H/O hiatal hernia   . Arthritis   . PAF (paroxysmal atrial fibrillation)   . Tachy-brady syndrome     a. post-termination pauses in setting of PAF;  b. 10/28/11 - St. Jude Dual Chamber PPM SN [id number]     Medications:  Scheduled:    . aspirin  81 mg Oral Daily  . atorvastatin  20 mg Oral q1800  . diltiazem  20 mg Intravenous Once  . famotidine  20 mg Oral Daily  . gi cocktail  30 mL Oral Once  . heparin  4,000 Units Intravenous Once  . levothyroxine  100 mcg Oral QAC breakfast  . metoprolol succinate  50 mg Oral Daily  . potassium chloride  20 mEq Oral BID  . potassium chloride  40 mEq Oral Once  . potassium chloride SA  40 mEq Oral Once  . sodium chloride  3 mL Intravenous Q12H  . Ticagrelor  90 mg Oral BID  . DISCONTD: aspirin  81 mg Oral Daily    Assessment: 66 yr old female with history of HTN, hyperlipidemia and PAF with a pacemaker. She had been taken of flecainide when  she was diagnosed with CAD. She presented at the ED after a couple of episodes of a.fib at home. She is to be started on heparin. Goal of Therapy:  Heparin level 0.3-0.7 units/ml Monitor platelets by anticoagulation protocol: Yes   Plan:  1) Heparin 4000 units bolus 2) Heparin drip at 1000 units/hr 3) Daily AM heparin level and CBC while on heparin.  Eugene Garnet 04/24/2012,9:47 PM

## 2012-04-24 NOTE — ED Provider Notes (Signed)
History     CSN: [id number]  Arrival date & time 04/24/12  1436   First MD Initiated Contact with Patient 04/24/12 1504      Chief Complaint  Patient presents with  . Palpitations     Patient is a 66 year old female who presents the emergency department with complaints of palpitations. Palpitations began at 1 PM. They have been gradually worsening.  Associated symptoms include intermittent left arm numbness, epigastric discomfort, and shortness of breath. Patient states episode feels similar to prior episodes of atrial fibrillation however this episode more severe. Patient given 324 mg aspirin prior to arrival.  Patient taken off of flecainide after recent CAD diagnosis by [facility].       (Consider location/radiation/quality/duration/timing/severity/associated sxs/prior treatment) Patient is a 66 y.o. female presenting with palpitations. The history is provided by the patient and medical records. No language interpreter was used.  Palpitations  This is a recurrent problem. The current episode started 3 to 5 hours ago. The problem occurs constantly. The problem has been gradually worsening. The problem is associated with an unknown factor. Associated symptoms include irregular heartbeat, weakness and shortness of breath. She has tried nothing for the symptoms. The treatment provided no relief. Risk factors include smoking/tobacco exposure and dyslipidemia. Her past medical history is significant for anemia, heart disease and hyperthyroidism.    Past Medical History  Diagnosis Date  . HTN (hypertension)   . Hypercholesteremia   . GERD (gastroesophageal reflux disease)   . Hypothyroidism   . Anemia   . Blood transfusion     " no reaction "  . H/O hiatal hernia   . Arthritis   . PAF (paroxysmal atrial fibrillation)   . Tachy-brady syndrome     a. post-termination pauses in setting of PAF;  b. 10/28/11 - [address] Jude Dual Chamber PPM SN [id number]     Past Surgical History  Procedure Date  .  Permanent pacemaker 10/28/2011  . Breast surgery     Benign lump    Family History  Problem Relation Age of Onset  . Heart attack Father 34  . Heart attack Mother 78  . Cancer Mother     Died age 61 with pancreatic cancer, also had uterine and breast cancer    History  Substance Use Topics  . Smoking status: Former Smoker    Quit date: 08/02/1977  . Smokeless tobacco: Never Used  . Alcohol Use: No    OB History    Grav Para Term Preterm Abortions TAB SAB Ect Mult Living                  Review of Systems  Respiratory: Positive for shortness of breath.   Cardiovascular: Positive for palpitations.  Neurological: Positive for weakness.  All other systems reviewed and are negative.    Allergies  Codeine and Tape  Home Medications   Current Outpatient Rx  Name Route Sig Dispense Refill  . ASPIRIN 81 MG PO TABS Oral Take 81 mg by mouth daily.    . CRESTOR 10 MG PO TABS Oral Take 10 mg by mouth Daily.    Marland Kitchen LEVOTHYROXINE SODIUM 100 MCG PO TABS Oral Take 100 mcg by mouth Daily.    Marland Kitchen METOPROLOL SUCCINATE ER 50 MG PO TB24 Oral Take 50 mg by mouth daily. Take with or immediately following a meal.    . NITROGLYCERIN 0.4 MG SL SUBL Sublingual Place 1 tablet (0.4 mg total) under the tongue every 5 (five) minutes x 3 doses as  needed for chest pain. 25 tablet 5  . POTASSIUM CHLORIDE ER 10 MEQ PO TBCR Oral Take 2 tablets (20 mEq total) by mouth 2 (two) times daily. 60 tablet 11  . RANITIDINE HCL 75 MG PO TABS Oral Take 75 mg by mouth daily.    Marland Kitchen TICAGRELOR 90 MG PO TABS Oral Take 1 tablet (90 mg total) by mouth 2 (two) times daily. 60 tablet 0  . TRIAMTERENE-HCTZ 75-50 MG PO TABS Oral Take 75 mg by mouth Daily.      BP 136/95  Pulse 149  Temp 98.2 F (36.8 C) (Oral)  Resp 16  SpO2 97%  Physical Exam  Nursing note and vitals reviewed. Constitutional: She is oriented to person, place, and time. She appears well-developed and well-nourished. No distress.  HENT:  Head:  Normocephalic and atraumatic.  Right Ear: External ear normal.  Left Ear: External ear normal.  Nose: Nose normal.  Mouth/Throat: Oropharynx is clear and moist.  Eyes: Conjunctivae normal and EOM are normal. Pupils are equal, round, and reactive to light.  Neck: Normal range of motion. Neck supple.  Cardiovascular: Intact distal pulses.  An irregularly irregular rhythm present. Tachycardia present.   Pulmonary/Chest: Effort normal and breath sounds normal.  Abdominal: Soft. Bowel sounds are normal.  Musculoskeletal: Normal range of motion. She exhibits no edema.  Neurological: She is alert and oriented to person, place, and time. She has normal reflexes. No cranial nerve deficit.  Skin: Skin is warm and dry.  Psychiatric: She has a normal mood and affect.    ED Course  Procedures (including critical care time)  Labs Reviewed  CBC WITH DIFFERENTIAL - Abnormal; Notable for the following:    RBC 5.16 (*)     All other components within normal limits  BASIC METABOLIC PANEL - Abnormal; Notable for the following:    Potassium 3.2 (*)     Glucose, Bld 104 (*)     GFR calc non Af Amer 55 (*)     GFR calc Af Amer 63 (*)     All other components within normal limits  COMPREHENSIVE METABOLIC PANEL - Abnormal; Notable for the following:    Potassium 3.1 (*)     Creatinine, Ser 1.15 (*)     Alkaline Phosphatase 24 (*)     GFR calc non Af Amer 49 (*)     GFR calc Af Amer 57 (*)     All other components within normal limits  TROPONIN I  PROTIME-INR  MAGNESIUM  TSH  BASIC METABOLIC PANEL  HEPARIN LEVEL (UNFRACTIONATED)  CBC   Dg Chest Portable 1 View  04/24/2012  *RADIOLOGY REPORT*  Clinical Data: Palpitations.  PORTABLE CHEST - 1 VIEW  Comparison: 04/02/2012  Findings: Portable sitting view of the chest demonstrates a left cardiac dual lead pacemaker.  Cannot exclude densities at the left costophrenic angle near the left cardiac apex.  Otherwise, the lungs are clear.  Trachea is  midline.  Negative for a pneumothorax.  IMPRESSION:  Question densities at the left costophrenic angle which could be related to the patient positioning and overlying soft tissue.  This area could be further evaluated with PA and lateral chest views. Otherwise, no acute chest findings.   Original Report Authenticated By: Richarda Overlie, M.D.       Date: 04/24/2012  Rate: 146  Rhythm: atrial fibrillation  QRS Axis: normal  Intervals: normal  ST/T Wave abnormalities: nonspecific ST changes  Conduction Disutrbances:none  Narrative Interpretation:   Old EKG Reviewed:  changes noted - when compared to 04/05/12 rate had increased and now in afib/aflutter    1. Palpitations   2. Arm numbness left   3. Shortness of breath   4. Atrial fibrillation with RVR       MDM    Patient presents to the ED with complaints of palpitations, intermittent episodes of left arm numbness, and SOB.  AF and VS remarkable for HR in the 150s upon arrival in the ED.  EKG confirmed afib with rvr.  Patient given diltiazem 20 mg bolus and started on gtt.  At rate of 10 mg/hr of diltiazem HR noted to be less than 100 and BP remained stable.  CXR and labs obtained to rule out acute cardiopulmonary abnormality, such as ischemia, and review of results shows troponin and CXR wnl.  K mildly low at 3.2 and 40 mEq PO given.  Due to recent h/o being taken off flecainide and need for medication management to ensure adequate rate control, patient admitted to cardiology service.  While in the ED, patient remained HDS, without new complaints, and admitted without acute events.          Johnney Ou, MD 04/24/12 [phone]

## 2012-04-24 NOTE — ED Notes (Signed)
Up to the bathroom 

## 2012-04-24 NOTE — ED Notes (Signed)
Ordered reg diet tray 

## 2012-04-25 DIAGNOSIS — I4892 Unspecified atrial flutter: Secondary | ICD-10-CM | POA: Diagnosis not present

## 2012-04-25 DIAGNOSIS — I4891 Unspecified atrial fibrillation: Principal | ICD-10-CM

## 2012-04-25 DIAGNOSIS — I2510 Atherosclerotic heart disease of native coronary artery without angina pectoris: Secondary | ICD-10-CM

## 2012-04-25 LAB — BASIC METABOLIC PANEL
CO2: 28 mEq/L (ref 19–32)
Chloride: 103 mEq/L (ref 96–112)
Chloride: 103 mEq/L (ref 96–112)
Creatinine, Ser: 0.94 mg/dL (ref 0.50–1.10)
GFR calc Af Amer: 72 mL/min — ABNORMAL LOW (ref >90)
GFR calc Af Amer: 62 mL/min — ABNORMAL LOW (ref >90)
GFR calc non Af Amer: 54 mL/min — ABNORMAL LOW (ref >90)
Glucose, Bld: 100 mg/dL — ABNORMAL HIGH (ref 70–99)
Potassium: 3.1 mEq/L — ABNORMAL LOW (ref 3.5–5.1)
Potassium: 3.5 mEq/L (ref 3.5–5.1)
Sodium: 142 mEq/L (ref 135–145)
Sodium: 141 mEq/L (ref 135–145)

## 2012-04-25 LAB — CBC
HCT: 37.1 % (ref 36.0–46.0)
Hemoglobin: 12.6 g/dL (ref 12.0–15.0)
MCHC: 34 g/dL (ref 30.0–36.0)
WBC: 6.7 10*3/uL (ref 4.0–10.5)

## 2012-04-25 LAB — HEPARIN LEVEL (UNFRACTIONATED): Heparin Unfractionated: 0.43 IU/mL (ref 0.30–0.70)

## 2012-04-25 MED ORDER — POTASSIUM CHLORIDE CRYS ER 20 MEQ PO TBCR
40.0000 meq | EXTENDED_RELEASE_TABLET | Freq: Every day | ORAL | Status: DC
  Administered 2012-04-25: 40 meq via ORAL
  Filled 2012-04-25: qty 2

## 2012-04-25 MED ORDER — DRONEDARONE HCL 400 MG PO TABS
400.0000 mg | ORAL_TABLET | Freq: Two times a day (BID) | ORAL | Status: DC
  Filled 2012-04-25 (×2): qty 1

## 2012-04-25 NOTE — Consult Note (Signed)
ELECTROPHYSIOLOGY CONSULT NOTE     Primary Care Physician: Gretel Acre, MD Referring Physician:  Dr Mayford Knife  Admit Date: 04/24/2012  Reason for consultation:  Atrial fibrillation  Cheryl [name] is a 66 y.o. female with a h/o paroxysmal atrial fibrillation who presents with recurrent symptoms of atrail fibrillation.  She reports initially being diagnosed with atrial fibrillation several years ago.  She was observed to have post termination pauses and therefore underwent PPM implantation by Dr Ladona Ridgel.  She was placed on flecainide with good control of her afib.  Unfortunately, she recently underwent cath revealing high grade stenosis of OM1 for which a bare metal stent was placed.  She was taken off of flecainide at that time.  She reports having very frequent episodes of afib since that time.  She presented to [facility] yesterday with atrial flutter with 2:1 conduction.  This degenerated into atrial fibrillation.  She converted overnight to sinus rhythm. Presently, she is resting comfortably and is without complaint. Today, she denies symptoms of palpitations, chest pain, shortness of breath, orthopnea, PND, lower extremity edema, dizziness, presyncope, syncope, or neurologic sequela. The patient is tolerating medications without difficulties and is otherwise without complaint today.   Past Medical History  Diagnosis Date  . HTN (hypertension)   . Hypercholesteremia   . GERD (gastroesophageal reflux disease)   . Hypothyroidism   . Anemia   . Blood transfusion     " no reaction "  . H/O hiatal hernia   . Arthritis   . PAF (paroxysmal atrial fibrillation)   . Tachy-brady syndrome     a. post-termination pauses in setting of PAF;  b. 10/28/11 - [address] Jude Dual Chamber PPM SN [id number]    Past Surgical History  Procedure Date  . Permanent pacemaker 10/28/2011  . Breast surgery     Benign lump       . aspirin  81 mg Oral Daily  . atorvastatin  20 mg Oral q1800  . famotidine  20 mg Oral  Daily  . heparin  4,000 Units Intravenous Once  . levothyroxine  100 mcg Oral QAC breakfast  . metoprolol succinate  50 mg Oral Daily  . potassium chloride  20 mEq Oral BID  . potassium chloride  40 mEq Oral Once  . sodium chloride  3 mL Intravenous Q12H  . Ticagrelor  90 mg Oral BID  . DISCONTD: aspirin  81 mg Oral Daily  . DISCONTD: atorvastatin  20 mg Oral q1800  . DISCONTD: potassium chloride  40 mEq Oral Daily      . diltiazem (CARDIZEM) infusion 10 mg/hr (04/25/12 1239)  . heparin 1,000 Units/hr (04/24/12 2225)    Allergies  Allergen Reactions  . Codeine     nausea  . Tape     Rash     History   Social History  . Marital Status: Married    Spouse Name: N/A    Number of Children: 4  . Years of Education: N/A   Occupational History  . Not on file.   Social History Main Topics  . Smoking status: Former Smoker    Quit date: 08/02/1977  . Smokeless tobacco: Never Used  . Alcohol Use: No  . Drug Use: No  . Sexually Active: Not Currently    Birth Control/ Protection: Post-menopausal   Other Topics Concern  . Not on file   Social History Narrative   Lives with husband.  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 Heart attack Father 55  . Heart attack Mother 86  . Cancer Mother     Died age 14 with pancreatic cancer, also had uterine and breast cancer    ROS- All systems are reviewed and negative except as per the HPI above  Physical Exam: Telemetry: Filed Vitals:   04/24/12 2311 04/25/12 0231 04/25/12 0451 04/25/12 1412  BP: 117/78 106/62 101/60 110/64  Pulse: 63 66 64 65  Temp:  97.8 F (36.6 C) 97.4 F (36.3 C) 97.8 F (36.6 C)  TempSrc:  Oral Oral Oral  Resp: 19 17 18 18   Height:      Weight:   181 lb 7 oz (82.3 kg)   SpO2: 98% 97% 99% 98%    GEN- The patient is well appearing, alert and oriented x 3 today.   Head- normocephalic, atraumatic Eyes-  Sclera clear,  conjunctiva pink Ears- hearing intact Oropharynx- clear Neck- supple, no JVP Lymph- no cervical lymphadenopathy Lungs- Clear to ausculation bilaterally, normal work of breathing Heart- Regular rate and rhythm, no murmurs, rubs or gallops, PMI not laterally displaced GI- soft, NT, ND, + BS Extremities- no clubbing, cyanosis, or edema MS- no significant deformity or atrophy Skin- no rash or lesion Psych- euthymic mood, full affect Neuro- strength and sensation are intact  EKGs are reviewed revealing atrial flutter with 2:1 conduction and atrial fibrillation Telemetry presently reveals sinus rhythm.  Labs:   Lab Results  Component Value Date   WBC 6.7 04/25/2012   HGB 12.6 04/25/2012   HCT 37.1 04/25/2012   MCV 83.2 04/25/2012   PLT 235 04/25/2012    Lab 04/25/12 1626 04/24/12 1914  NA 141 --  K 3.5 --  CL 103 --  CO2 29 --  BUN 15 --  CREATININE 1.06 --  CALCIUM 9.4 --  PROT -- 7.9  BILITOT -- 0.4  ALKPHOS -- 24*  ALT -- 18  AST -- 21  GLUCOSE 100* --   Lab Results  Component Value Date   TROPONINI <0.30 04/24/2012    Lab Results  Component Value Date   CHOL 153 04/03/2012   Lab Results  Component Value Date   HDL 70 04/03/2012   Lab Results  Component Value Date   LDLCALC 62 04/03/2012   Lab Results  Component Value Date   TRIG 105 04/03/2012   Lab Results  Component Value Date   CHOLHDL 2.2 04/03/2012   No results found for this basename: LDLDIRECT      Echo:  I have asked Dr Mayford Knife to see if she has a recent echo available from the office.  If not then we should obtain one.  ASSESSMENT AND PLAN:   1. Atrial fibrillation-  The patient has symptomatic recurrent paroxysmal atrial fibrillation.  This admit, she is also documented to have atrial flutter.  She has failed medical therapy with flecainide. Therapeutic strategies for afib and atrial flutter including medicine and ablation were discussed in detail with the patient today. We discussed pros and cons to  multaq, tikosyn, and amiodarone. Risk, benefits, and alternatives to EP study and radiofrequency ablation were also discussed in detail today. These risks include but are not limited to stroke, bleeding, vascular damage, tamponade, perforation, damage to the esophagus, lungs, and other structures, pulmonary vein stenosis, worsening renal function, and death. The patient understands these risk and wishes to proceed. She will continue brillinta for 1 more week (as scheduled). She will then stop  brillinta and start xarelto 20mg  daily.  Once she has been anticoagulated for at least 3 weeks then we will proceed with ablation. In the interim, I will initiate multaq 400mg  BID.  I will arrange office follow-up and also ablation.  2. Atrial flutter- as above  3. CAD- as above  I anticipate DC to home in the am. Stop heparin.  Hillis Range, MD 04/25/2012  5:57 PM

## 2012-04-25 NOTE — Progress Notes (Signed)
ANTICOAGULATION CONSULT NOTE   Pharmacy Consult for  Heparin Indication: chest pain/ACS  Allergies  Allergen Reactions  . Codeine     nausea  . Tape     Rash     Patient Measurements: Height: 5\' 3"  (160 cm) Weight: 181 lb 7 oz (82.3 kg) (scale B) IBW/kg (Calculated) : 52.4  Heparin Dosing Weight: 71.7 kg  Vital Signs: Temp: 97.4 F (36.3 C) (09/24 0451) Temp src: Oral (09/24 0451) BP: 101/60 mmHg (09/24 0451) Pulse Rate: 64  (09/24 0451)  Labs:  Basename 04/25/12 0500 04/24/12 1914 04/24/12 1510  HGB 12.6 -- 15.0  HCT 37.1 -- 42.0  PLT 235 -- 307  APTT -- -- --  LABPROT -- -- 12.7  INR -- -- 0.96  HEPARINUNFRC 0.43 -- --  CREATININE 0.94 1.15* 1.05  CKTOTAL -- -- --  CKMB -- -- --  TROPONINI -- -- <0.30    Estimated Creatinine Clearance: 60.7 ml/min (by C-G formula based on Cr of 0.94).  Assessment: 66 yr old female with Afib for Heparin  Goal of Therapy:  Heparin level 0.3-0.7 units/ml Monitor platelets by anticoagulation protocol: Yes   Plan:  Continue Heparin at current rate  F/U plan  Eddie Candle 04/25/2012,7:54 AM

## 2012-04-25 NOTE — Progress Notes (Signed)
PHARMACIST - PHYSICIAN ORDER COMMUNICATION  CONCERNING: Dronedarone Scientist, physiological) Safe Administration Policy  RECOMMENDATION(s): [x]  Recommend QTc monitoring. [x] Recommend periodic liver function monitoring. []  Patient has a history of QTc >500. Please evaluate possibility of changing therapy. []  Patient has been identified with a history of heart failure. Please continue to evaluate for symptoms of decompensation. []  Patient has been identified with a drug interaction: ______________________  []  Other:  ________________________________________________________  Your patient has been prescribed dronedarone (Multaq), an antiarrhythmic related to amiodarone. There are some concerns regarding the use and monitoring of this medication:  . Increased mortality in patients with advanced heart failure. Dronedarone is contraindicated in patients with Class IV heart failure and in patients with Class II-III heart failure who have been hospitalized in the past month for decompensation. Pharmacy will continue to monitor for signs of decompensated heart failure.  . Increased mortality in patients with permanent atrial fibrillation. Dronedarone is contraindicated in patients who will not or cannot be converted to normal sinus rhythm.  . Prolonged QTc interval, with a potential to put the patient at risk for torsades de pointes. Because of this, Dronedarone should not be used in patients whose QTc is ?500 msec or PR interval is >280 msec. Medications that block Dronedarone's metabolism or also prolong the QTc should be avoided in combination with Dronedarone:  []  Azole antifungals (ketoconazole, voriconazole, itraconazole, fluconazole)  []  Quinolone antibiotics (levofloxacin, ciprofloxacin, moxifloxacin)  []  Macrolide antibiotics (azithromycin, erythromycin, clarithromycin)  []  Miscellaneous antibiotics (sulfamethoxazole-trimethoprim)  []  SSRIs/SNRIs (sertraline, fluoxetine, venlafaxine, citalopram,  paroxetine)  []  Antipsychotics (haloperidol, quetiapine, ziprasidone, risperidone, olanzapine, clozapine) []  Antiarrhythmics (dofetilide, flecainide, ibutilide, procainamide, propafenone, quinidine, sotalol, disopyramide)  []  Tricyclic antidepressants (amitriptyline, clomipramine, doxepin, imipramine,                   nortriptyline, desipramine)  [x]  Antiemetics (ondansetron, prochlorperazine)  []  Pain relievers/migraine medications/muscle relaxers (methadone, sumatriptan,         cyclobenzaprine)  []  Miscellaneous (alfuzosin, amantadine, cyclosporine, diphenhydramine, lithium,               nicardipine, octreotide, ranolazine, ritonavir, solifenacin, tacrolimus, tamoxifen, tizanidine, trazodone)  Thank Lindley Magnus 9:40 PM 04/25/2012

## 2012-04-25 NOTE — ED Provider Notes (Signed)
I saw and evaluated the patient, reviewed the resident's note and I agree with the findings and plan. I saw and evaluated the EKG and agree with the resident's interpretation. Patient presented due to intermittent bouts of palpitations which worsened today and made her feel nauseated and lightheaded. On arrival here patient was in A. fib RVR with a rate of 150. Her initial labs were within normal limits. Patient was started on a Cardizem drip and required multiple titrations to control her heart rate around 100. Cardiology was called and after being on Cardizem drip for over one hour she spontaneously converted to sinus rhythm. CRITICAL CARE Performed by: Gwyneth Sprout   Total critical care time: 30  Critical care time was exclusive of separately billable procedures and treating other patients.  Critical care was necessary to treat or prevent imminent or life-threatening deterioration.  Critical care was time spent personally by me on the following activities: development of treatment plan with patient and/or surrogate as well as nursing, discussions with consultants, evaluation of patient's response to treatment, examination of patient, obtaining history from patient or surrogate, ordering and performing treatments and interventions, ordering and review of laboratory studies, ordering and review of radiographic studies, pulse oximetry and re-evaluation of patient's condition.   Gwyneth Sprout, MD 04/25/12 2358

## 2012-04-25 NOTE — Plan of Care (Signed)
Problem: Phase II Progression Outcomes Goal: Pain controlled Outcome: Completed/Met Date Met:  04/25/12 Pt has had no c/o pain during stay, pt a/o independent, goal met Goal: Progress activity as tolerated unless otherwise ordered Outcome: Completed/Met Date Met:  04/25/12 Pt oob ad lib, no c/o SOB or c/p while up moving about, goal met, will monitor  Goal: Tolerating diet Outcome: Completed/Met Date Met:  04/25/12 Pt on heart healthy diet, no c/o N/V, pt has good po intake, pt has no issues using bathroom, goal met, will monitor

## 2012-04-25 NOTE — Progress Notes (Signed)
SUBJECTIVE:  Back in NSR.  No complaints  OBJECTIVE:   Vitals:   Filed Vitals:   04/24/12 2030 04/24/12 2311 04/25/12 0231 04/25/12 0451  BP: 124/79 117/78 106/62 101/60  Pulse: 72 63 66 64  Temp: 98.2 F (36.8 C)  97.8 F (36.6 C) 97.4 F (36.3 C)  TempSrc: Oral  Oral Oral  Resp: 18 19 17 18   Height:      Weight:    82.3 kg (181 lb 7 oz)  SpO2: 99% 98% 97% 99%   I&O's:   Intake/Output Summary (Last 24 hours) at 04/25/12 1103 Last data filed at 04/25/12 1023  Gross per 24 hour  Intake 458.91 ml  Output   1700 ml  Net -1241.09 ml   TELEMETRY: Reviewed telemetry pt in NSR:     PHYSICAL EXAM General: Well developed, well nourished, in no acute distress Head: Eyes PERRLA, No xanthomas.   Normal cephalic and atramatic  Lungs:   Clear bilaterally to auscultation and percussion. Heart:   HRRR S1 S2 Pulses are 2+ & equal. Abdomen: Bowel sounds are positive, abdomen soft and non-tender without masses  Extremities:   No clubbing, cyanosis or edema.  DP +1 Neuro: Alert and oriented X 3. Psych:  Good affect, responds appropriately   LABS: Basic Metabolic Panel:  Basename 04/25/12 0500 04/24/12 1914  NA 142 143  K 3.1* 3.1*  CL 103 100  CO2 28 28  GLUCOSE 86 82  BUN 14 18  CREATININE 0.94 1.15*  CALCIUM 9.0 10.2  MG -- 1.9  PHOS -- --   Liver Function Tests:  [facility] 04/24/12 1914  AST 21  ALT 18  ALKPHOS 24*  BILITOT 0.4  PROT 7.9  ALBUMIN 4.2   No results found for this basename: LIPASE:2,AMYLASE:2 in the last 72 hours CBC:  Basename 04/25/12 0500 04/24/12 1510  WBC 6.7 9.8  NEUTROABS -- 6.9  HGB 12.6 15.0  HCT 37.1 42.0  MCV 83.2 81.4  PLT 235 307   Cardiac Enzymes:  Basename 04/24/12 1510  CKTOTAL --  CKMB --  CKMBINDEX --  TROPONINI <0.30   Thyroid Function Tests:  Basename 04/24/12 1510  TSH 0.480  T4TOTAL --  T3FREE --  THYROIDAB --   Anemia Panel: No results found for this basename:  VITAMINB12,FOLATE,FERRITIN,TIBC,IRON,RETICCTPCT in the last 72 hours Coag Panel:   Lab Results  Component Value Date   INR 0.96 04/24/2012   INR 1.04 04/04/2012   INR 1.03 04/03/2012    RADIOLOGY: Dg Chest 2 View  04/02/2012  *RADIOLOGY REPORT*  Clinical Data: Worsening shortness of breath.  Weakness.  CHEST - 2 VIEW  Comparison: 10/29/2011.  Findings: Left subclavian pacemaker leads are unchanged within the right atrium and right ventricle.  Heart size and mediastinal contours are stable.  There is mild central airway thickening without confluent airspace opacity, edema or significant pleural effusion.  Osseous structures appear unchanged.  Telemetry leads overlie the chest.  IMPRESSION: Mild central airway thickening suggesting bronchitis.  No evidence of pneumonia or edema.   Original Report Authenticated By: Gerrianne Scale, M.D.    Dg Chest Portable 1 View  04/24/2012  *RADIOLOGY REPORT*  Clinical Data: Palpitations.  PORTABLE CHEST - 1 VIEW  Comparison: 04/02/2012  Findings: Portable sitting view of the chest demonstrates a left cardiac dual lead pacemaker.  Cannot exclude densities at the left costophrenic angle near the left cardiac apex.  Otherwise, the lungs are clear.  Trachea is midline.  Negative for a pneumothorax.  IMPRESSION:  Question densities at the left costophrenic angle which could be related to the patient positioning and overlying soft tissue.  This area could be further evaluated with PA and lateral chest views. Otherwise, no acute chest findings.   Original Report Authenticated By: Richarda Overlie, M.D.       ASSESSMENT:  1.  PAF with a severe episode yesterday with RVR and very symptomatic 2.  Systemic anticoagulation 3.  HTN 4.  CAD s/p PCI of OM1 5.  Sinoatrial node dysfunction s/p PPM 6.  Hypokalemia secondary to diuretic use   PLAN:   1.  I will keep her off the maxide due to hypokalemia.  She was on this for HTN.  She has been supplemented at home with potassium but  despite that she still has low potassium levels. 2.  Replete potassium 3.  Check Magnesium 4.  I have discussed the options with her in regards to Tikosyn vs. Atrial fibrillation ablation.  I will consult Dr. Johney Frame to discuss options in regards to antiarrhythmic therapy vs. afib ablation  Quintella Reichert, MD  04/25/2012  11:03 AM

## 2012-04-26 DIAGNOSIS — I4891 Unspecified atrial fibrillation: Secondary | ICD-10-CM | POA: Diagnosis not present

## 2012-04-26 DIAGNOSIS — I2510 Atherosclerotic heart disease of native coronary artery without angina pectoris: Secondary | ICD-10-CM | POA: Diagnosis not present

## 2012-04-26 DIAGNOSIS — Z7901 Long term (current) use of anticoagulants: Secondary | ICD-10-CM | POA: Diagnosis not present

## 2012-04-26 LAB — BASIC METABOLIC PANEL
Chloride: 103 mEq/L (ref 96–112)
GFR calc Af Amer: 69 mL/min — ABNORMAL LOW (ref >90)
GFR calc non Af Amer: 59 mL/min — ABNORMAL LOW (ref >90)
Glucose, Bld: 89 mg/dL (ref 70–99)
Potassium: 3.5 mEq/L (ref 3.5–5.1)
Sodium: 142 mEq/L (ref 135–145)

## 2012-04-26 MED ORDER — FAMOTIDINE 20 MG PO TABS: 20.0000 mg | ORAL_TABLET | Freq: Every day | ORAL | Status: DC

## 2012-04-26 MED ORDER — DRONEDARONE HCL 400 MG PO TABS: 400.0000 mg | ORAL_TABLET | Freq: Two times a day (BID) | ORAL | Status: DC

## 2012-04-26 NOTE — Plan of Care (Signed)
Problem: Phase II Progression Outcomes Goal: Discharge plan established Outcome: Completed/Met Date Met:  04/26/12 D/C plan is for pt to return home with husband, goal met, pt does not need assistance at home  Problem: Phase III Progression Outcomes Goal: Pain controlled on oral analgesia Outcome: Completed/Met Date Met:  04/26/12 Pt has not had any c/o c/p, goal met Goal: Activity at appropriate level-compared to baseline (UP IN CHAIR FOR HEMODIALYSIS)  Outcome: Completed/Met Date Met:  04/26/12 Pt oob ad lib, pt remains at baseline, she is independent, goal met Goal: Tolerating diet Outcome: Completed/Met Date Met:  04/26/12 Pt on heart healthy diet, pt tolerating well, no c/o N/V goal met

## 2012-04-26 NOTE — Progress Notes (Signed)
SUBJECTIVE: The patient is doing well today.  At this time, she denies chest pain, shortness of breath, or any new concerns.     Marland Kitchen aspirin  81 mg Oral Daily  . atorvastatin  20 mg Oral q1800  . dronedarone  400 mg Oral BID WC  . famotidine  20 mg Oral Daily  . levothyroxine  100 mcg Oral QAC breakfast  . metoprolol succinate  50 mg Oral Daily  . potassium chloride  20 mEq Oral BID  . potassium chloride  40 mEq Oral Once  . sodium chloride  3 mL Intravenous Q12H  . Ticagrelor  90 mg Oral BID  . DISCONTD: potassium chloride  40 mEq Oral Daily      . DISCONTD: diltiazem (CARDIZEM) infusion 10 mg/hr (04/25/12 1239)  . DISCONTD: heparin 1,000 Units/hr (04/24/12 2225)    OBJECTIVE: Physical Exam: Filed Vitals:   04/25/12 2126 04/26/12 0520 04/26/12 0909 04/26/12 0938  BP: 128/72 117/69 143/67   Pulse: 62 67 68 68  Temp: 97.7 F (36.5 C) 97.8 F (36.6 C)  97.9 F (36.6 C)  TempSrc: Oral Oral  Oral  Resp: 18 18    Height:      Weight:  181 lb (82.1 kg)    SpO2: 98% 98%  100%    Intake/Output Summary (Last 24 hours) at 04/26/12 1039 Last data filed at 04/26/12 0800  Gross per 24 hour  Intake    700 ml  Output   1025 ml  Net   -325 ml    Telemetry reveals atrial pacing  GEN- The patient is well appearing, alert and oriented x 3 today.   Head- normocephalic, atraumatic Eyes-  Sclera clear, conjunctiva pink Ears- hearing intact Oropharynx- clear Neck- supple  Lungs-  normal work of breathing Heart- Regular rate and rhythm  Extremities- no clubbing, cyanosis, or edema   LABS: Basic Metabolic Panel:  Basename 04/26/12 0640 04/25/12 1626 04/24/12 1914  NA 142 141 --  K 3.5 3.5 --  CL 103 103 --  CO2 28 29 --  GLUCOSE 89 100* --  BUN 17 15 --  CREATININE 0.98 1.06 --  CALCIUM 9.2 9.4 --  MG -- 1.7 1.9  PHOS -- -- --   Liver Function Tests:  [facility] 04/24/12 1914  AST 21  ALT 18  ALKPHOS 24*  BILITOT 0.4  PROT 7.9  ALBUMIN 4.2   No results  found for this basename: LIPASE:2,AMYLASE:2 in the last 72 hours CBC:  Basename 04/25/12 0500 04/24/12 1510  WBC 6.7 9.8  NEUTROABS -- 6.9  HGB 12.6 15.0  HCT 37.1 42.0  MCV 83.2 81.4  PLT 235 307   Cardiac Enzymes:  Basename 04/24/12 1510  CKTOTAL --  CKMB --  CKMBINDEX --  TROPONINI <0.30   BNP: No components found with this basename: POCBNP:3 D-Dimer: No results found for this basename: DDIMER:2 in the last 72 hours Hemoglobin A1C: No results found for this basename: HGBA1C in the last 72 hours Fasting Lipid Panel: No results found for this basename: CHOL,HDL,LDLCALC,TRIG,CHOLHDL,LDLDIRECT in the last 72 hours Thyroid Function Tests:  Basename 04/24/12 1510  TSH 0.480  T4TOTAL --  T3FREE --  THYROIDAB --   Anemia Panel: No results found for this basename: VITAMINB12,FOLATE,FERRITIN,TIBC,IRON,RETICCTPCT in the last 72 hours  RADIOLOGY: Dg Chest 2 View  04/02/2012  *RADIOLOGY REPORT*  Clinical Data: Worsening shortness of breath.  Weakness.  CHEST - 2 VIEW  Comparison: 10/29/2011.  Findings: Left subclavian pacemaker leads are unchanged  within the right atrium and right ventricle.  Heart size and mediastinal contours are stable.  There is mild central airway thickening without confluent airspace opacity, edema or significant pleural effusion.  Osseous structures appear unchanged.  Telemetry leads overlie the chest.  IMPRESSION: Mild central airway thickening suggesting bronchitis.  No evidence of pneumonia or edema.   Original Report Authenticated By: Gerrianne Scale, M.D.    Dg Chest Portable 1 View  04/24/2012  *RADIOLOGY REPORT*  Clinical Data: Palpitations.  PORTABLE CHEST - 1 VIEW  Comparison: 04/02/2012  Findings: Portable sitting view of the chest demonstrates a left cardiac dual lead pacemaker.  Cannot exclude densities at the left costophrenic angle near the left cardiac apex.  Otherwise, the lungs are clear.  Trachea is midline.  Negative for a pneumothorax.   IMPRESSION:  Question densities at the left costophrenic angle which could be related to the patient positioning and overlying soft tissue.  This area could be further evaluated with PA and lateral chest views. Otherwise, no acute chest findings.   Original Report Authenticated By: Richarda Overlie, M.D.     ASSESSMENT AND PLAN:  Active Problems:  PAF (paroxysmal atrial fibrillation)  Tachy-brady syndrome  CAD (coronary artery disease)  Atrial flutter  1. Afib/ atrial flutter- maintaining sinus rhythm Start multaq 400mg  BID Dr Mayford Knife to follow-up on office echo results Start xarelto once brilinta is completed. Will plan afib ablation in about 5 weeks She will see me again 10/14 for further discussion.  I will see as needed while here. I anticipate that she will go home today.    Hillis Range, MD 04/26/2012 10:39 AM

## 2012-04-26 NOTE — Progress Notes (Signed)
Pt a/o, pt being d/c'd to home, pt left via wheelchair with family, d/c instructions and follow up appointments given to pt, pt verbalized understanding, pt stable upon d/c Worthy Flank, RN

## 2012-04-26 NOTE — Discharge Summary (Signed)
Patient ID: Cheryl [name] MRN: [id number] DOB/AGE: 09/29/1945 66 y.o.  Admit date: 04/24/2012 Discharge date: 04/26/2012  Primary Discharge Diagnosis: Paroxysmal atrial fibrillation/flutter  Secondary Discharge Diagnosis: Coronary artery disease - recent small obtuse marginal branch disease going to complete dual antiplatelet therapy in the beginning of October For full 30 days. Coronary artery disease, tachybradycardia syndrome, pacemaker  Consults:  Dr. Johney Frame - initiated Multaq 400mg  BID. Ablation arranged. Xarelto 20mg  to start next week after finishing Brilinta for 30 days.  Hospital Course:  symptomatic recurrent paroxysmal H. or fibrillation. Atrial flutter also noted. Failed medical therapy with flecainide. Dr. Johney Frame with selective physiology was consulted. She will plan on having ablative procedure. He will be seeing her in the interim. She converted to sinus rhythm. Atrial pacing noted on telemetry. No adverse arrhythmias. Recent cardiac catheterization with obtuse marginal disease. No stenting. She is taking Brilinta for one month. In the beginning of October this will be discontinued. At that point the recommendation is to initiate Xarelto.   Hypokalemia has been noted. She mentioned to me that her Maxzide has been discontinued because of this.  She also thought that Pepcid has been helping her more with her GERD since the hospitalization. I will prescribe.  If any further worrisome symptoms develop, she knows to contact our office.   Discharge Exam: Blood pressure 143/67, pulse 68, temperature 97.9 F (36.6 C), temperature source Oral, resp. rate 18, height 5\' 3"  (1.6 m), weight 82.1 kg (181 lb), SpO2 100.00%.    General: Alert and oriented x3 no acute distress, overweight.  CV: Regular rate and rhythm  Lungs: Clear to auscultation bilaterally  Neuro: Nonfocal ambulating well.  Labs:   Lab Results  Component Value Date   WBC 6.7 04/25/2012   HGB 12.6 04/25/2012   HCT 37.1  04/25/2012   MCV 83.2 04/25/2012   PLT 235 04/25/2012    Lab 04/26/12 0640 04/24/12 1914  NA 142 --  K 3.5 --  CL 103 --  CO2 28 --  BUN 17 --  CREATININE 0.98 --  CALCIUM 9.2 --  PROT -- 7.9  BILITOT -- 0.4  ALKPHOS -- 24*  ALT -- 18  AST -- 21  GLUCOSE 89 --   Lab Results  Component Value Date   TROPONINI <0.30 04/24/2012    Lab Results  Component Value Date   CHOL 153 04/03/2012   Lab Results  Component Value Date   HDL 70 04/03/2012   Lab Results  Component Value Date   LDLCALC 62 04/03/2012   Lab Results  Component Value Date   TRIG 105 04/03/2012   Lab Results  Component Value Date   CHOLHDL 2.2 04/03/2012   No results found for this basename: LDLDIRECT      FOLLOW UP PLANS AND APPOINTMENTS Discharge Orders    Future Appointments: Provider: Department: Dept Phone: Center:   05/15/2012 3:30 PM Hillis Range, MD [facility] [phone] [facility]       Medication List     As of 04/26/2012 12:25 PM    STOP taking these medications         ranitidine 75 MG tablet   Commonly known as: ZANTAC   Replaced by: famotidine 20 MG tablet      triamterene-hydrochlorothiazide 75-50 MG per tablet   Commonly known as: MAXZIDE      TAKE these medications         aspirin 81 MG tablet   Take 81 mg by mouth daily.  CRESTOR 10 MG tablet   Generic drug: rosuvastatin   Take 10 mg by mouth Daily.      dronedarone 400 MG tablet   Commonly known as: MULTAQ   Take 1 tablet (400 mg total) by mouth 2 (two) times daily with a meal.      famotidine 20 MG tablet   Commonly known as: PEPCID   Take 1 tablet (20 mg total) by mouth daily.      levothyroxine 100 MCG tablet   Commonly known as: SYNTHROID, LEVOTHROID   Take 100 mcg by mouth Daily.      metoprolol succinate 50 MG 24 hr tablet   Commonly known as: TOPROL-XL   Take 50 mg by mouth daily. Take with or immediately following a meal.      nitroGLYCERIN 0.4 MG SL tablet   Commonly known as:  NITROSTAT   Place 1 tablet (0.4 mg total) under the tongue every 5 (five) minutes x 3 doses as needed for chest pain.      potassium chloride 10 MEQ tablet   Commonly known as: K-DUR   Take 2 tablets (20 mEq total) by mouth 2 (two) times daily.      Ticagrelor 90 MG Tabs tablet   Commonly known as: BRILINTA   Take 1 tablet (90 mg total) by mouth 2 (two) times daily.           Follow-up Information    Follow up with Quintella Reichert, MD. On 05/01/2012. (9:45am)    Contact information:   [address] [place] [address] [phone]          BRING ALL MEDICATIONS WITH YOU TO FOLLOW UP APPOINTMENTS  Time spent with patient to include physician time:16min coordination of care, medical reconciliation, review of medical records, patient instructions, questions answered SignedDonato Schultz 04/26/2012, 12:25 PM

## 2012-04-26 NOTE — Progress Notes (Signed)
Appt scheduled with Dr Johney Frame on May 15, 2012 at 3:30PM to discuss afib ablation.  Please notify patient of this appt at discharge.   Gypsy Balsam, Charity fundraiser, Astronomer

## 2012-04-26 NOTE — Progress Notes (Signed)
I cosign Ashley Moore, RN's assessments, notes, I&O, med administration, care plan, and education. 

## 2012-04-27 ENCOUNTER — Ambulatory Visit (HOSPITAL_COMMUNITY): Payer: Medicare Other

## 2012-05-01 ENCOUNTER — Ambulatory Visit (HOSPITAL_COMMUNITY): Payer: Medicare Other

## 2012-05-01 DIAGNOSIS — Z79899 Other long term (current) drug therapy: Secondary | ICD-10-CM | POA: Diagnosis not present

## 2012-05-01 DIAGNOSIS — I4891 Unspecified atrial fibrillation: Secondary | ICD-10-CM | POA: Diagnosis not present

## 2012-05-01 DIAGNOSIS — I10 Essential (primary) hypertension: Secondary | ICD-10-CM | POA: Diagnosis not present

## 2012-05-01 DIAGNOSIS — I2510 Atherosclerotic heart disease of native coronary artery without angina pectoris: Secondary | ICD-10-CM | POA: Diagnosis not present

## 2012-05-01 DIAGNOSIS — Z950 Presence of cardiac pacemaker: Secondary | ICD-10-CM | POA: Diagnosis not present

## 2012-05-03 ENCOUNTER — Ambulatory Visit (HOSPITAL_COMMUNITY): Payer: Medicare Other

## 2012-05-04 ENCOUNTER — Encounter: Payer: Self-pay | Admitting: *Deleted

## 2012-05-04 ENCOUNTER — Other Ambulatory Visit: Payer: Self-pay | Admitting: *Deleted

## 2012-05-04 DIAGNOSIS — Z79899 Other long term (current) drug therapy: Secondary | ICD-10-CM | POA: Diagnosis not present

## 2012-05-05 ENCOUNTER — Ambulatory Visit (HOSPITAL_COMMUNITY): Payer: Medicare Other

## 2012-05-08 ENCOUNTER — Ambulatory Visit (HOSPITAL_COMMUNITY): Payer: Medicare Other

## 2012-05-09 ENCOUNTER — Other Ambulatory Visit: Payer: Self-pay | Admitting: *Deleted

## 2012-05-09 ENCOUNTER — Encounter: Payer: Self-pay | Admitting: *Deleted

## 2012-05-09 DIAGNOSIS — Z01812 Encounter for preprocedural laboratory examination: Secondary | ICD-10-CM

## 2012-05-09 DIAGNOSIS — I4891 Unspecified atrial fibrillation: Secondary | ICD-10-CM

## 2012-05-10 ENCOUNTER — Ambulatory Visit (HOSPITAL_COMMUNITY): Payer: Medicare Other

## 2012-05-11 ENCOUNTER — Telehealth: Payer: Self-pay | Admitting: Internal Medicine

## 2012-05-11 DIAGNOSIS — R05 Cough: Secondary | ICD-10-CM | POA: Diagnosis not present

## 2012-05-11 NOTE — Telephone Encounter (Signed)
Pt calling to see if meds could be causing leg pain, hurting very badly, takaing xarelto , pls call

## 2012-05-12 ENCOUNTER — Other Ambulatory Visit: Payer: Self-pay | Admitting: Family Medicine

## 2012-05-12 ENCOUNTER — Ambulatory Visit
Admission: RE | Admit: 2012-05-12 | Discharge: 2012-05-12 | Disposition: A | Discharge status: Home / Self Care | Payer: Medicare Other | Source: Ambulatory Visit | Attending: Family Medicine | Admitting: Family Medicine

## 2012-05-12 ENCOUNTER — Ambulatory Visit (HOSPITAL_COMMUNITY): Payer: Medicare Other

## 2012-05-12 DIAGNOSIS — M79609 Pain in unspecified limb: Secondary | ICD-10-CM | POA: Diagnosis not present

## 2012-05-12 DIAGNOSIS — M79604 Pain in right leg: Secondary | ICD-10-CM

## 2012-05-12 DIAGNOSIS — M7989 Other specified soft tissue disorders: Secondary | ICD-10-CM

## 2012-05-12 DIAGNOSIS — T148XXA Other injury of unspecified body region, initial encounter: Secondary | ICD-10-CM | POA: Diagnosis not present

## 2012-05-12 NOTE — Telephone Encounter (Signed)
Had an ultrasound yesterday at Dr Malachy Mood office that was negative.  She was is going to go to an ortho if not better

## 2012-05-15 ENCOUNTER — Encounter: Payer: Self-pay | Admitting: Internal Medicine

## 2012-05-15 ENCOUNTER — Ambulatory Visit (INDEPENDENT_AMBULATORY_CARE_PROVIDER_SITE_OTHER): Payer: Medicare Other | Admitting: Internal Medicine

## 2012-05-15 ENCOUNTER — Ambulatory Visit (HOSPITAL_COMMUNITY): Payer: Medicare Other

## 2012-05-15 ENCOUNTER — Other Ambulatory Visit: Payer: Self-pay | Admitting: Family Medicine

## 2012-05-15 VITALS — BP 149/66 | HR 67 | Ht 63.0 in | Wt 181.0 lb

## 2012-05-15 DIAGNOSIS — I4891 Unspecified atrial fibrillation: Secondary | ICD-10-CM | POA: Diagnosis not present

## 2012-05-15 DIAGNOSIS — I495 Sick sinus syndrome: Secondary | ICD-10-CM

## 2012-05-15 DIAGNOSIS — Z01812 Encounter for preprocedural laboratory examination: Secondary | ICD-10-CM

## 2012-05-15 DIAGNOSIS — I480 Paroxysmal atrial fibrillation: Secondary | ICD-10-CM

## 2012-05-15 DIAGNOSIS — I4892 Unspecified atrial flutter: Secondary | ICD-10-CM | POA: Diagnosis not present

## 2012-05-15 LAB — PACEMAKER DEVICE OBSERVATION
AL AMPLITUDE: 3.6 mv
BAMS-0001: 170 {beats}/min
BATTERY VOLTAGE: 2.9629 V
RV LEAD AMPLITUDE: 12 mv
RV LEAD IMPEDENCE PM: 562.5 Ohm
VENTRICULAR PACING PM: 3.5

## 2012-05-15 NOTE — Progress Notes (Signed)
 Primary Care Physician: NNODI, ADAKU, MD Referring Physician:  Dr Turner  Cheryl [name] is a 66 y.o. female with a h/o paroxysmal atrial fibrillation who presents for further EP followup and consideration of ablation.   She was diagnosed with atrial fibrillation several years ago. She was observed to have post termination pauses and therefore underwent PPM implantation by Dr Taylor. She was placed on flecainide with good control of her afib. Unfortunately, she recently underwent cath revealing high grade stenosis of OM1 for which a bare metal stent was placed. She was taken off of flecainide at that time. She reports having very frequent episodes of afib and was admitted 9/13 when she presented to [place] yesterday with atrial flutter with 2:1 conduction. This degenerated into atrial fibrillation. She converted overnight to sinus rhythm.  She was placed on multaq with plans to return today to consider ablation.  Today, she denies symptoms of palpitations, chest pain, shortness of breath, orthopnea, PND, lower extremity edema, dizziness, presyncope, syncope, or neurologic sequela. The patient is tolerating medications without difficulties and is otherwise without complaint today.    Past Medical History  Diagnosis Date  . HTN (hypertension)   . Hypercholesteremia   . GERD (gastroesophageal reflux disease)   . Hypothyroidism   . Anemia   . Blood transfusion     " no reaction "  . H/O hiatal hernia   . Arthritis   . PAF (paroxysmal atrial fibrillation)   . Tachy-brady syndrome     a. post-termination pauses in setting of PAF;  b. 10/28/11 - [employer] Dual Chamber PPM SN [id number]    Past Surgical History  Procedure Date  . Permanent pacemaker 10/28/2011  . Breast surgery     Benign lump    Current Outpatient Prescriptions  Medication Sig Dispense Refill  . aspirin 81 MG tablet Take 81 mg by mouth daily.      . CRESTOR 10 MG tablet Take 10 mg by mouth Daily.      . dronedarone  (MULTAQ) 400 MG tablet Take 1 tablet (400 mg total) by mouth 2 (two) times daily with a meal.  60 tablet  60  . famotidine (PEPCID) 20 MG tablet Take 1 tablet (20 mg total) by mouth daily.  30 tablet  12  . fluticasone (FLONASE) 50 MCG/ACT nasal spray Place into the nose as directed.       . levothyroxine (SYNTHROID, LEVOTHROID) 100 MCG tablet Take 100 mcg by mouth Daily.      . loratadine (CLARITIN) 10 MG tablet Take 10 mg by mouth daily.      . metoprolol succinate (TOPROL-XL) 50 MG 24 hr tablet Take 50 mg by mouth daily. Take with or immediately following a meal.      . nitroGLYCERIN (NITROSTAT) 0.4 MG SL tablet Place 1 tablet (0.4 mg total) under the tongue every 5 (five) minutes x 3 doses as needed for chest pain.  25 tablet  5  . XARELTO 20 MG TABS Take 20 mg by mouth daily.         Allergies  Allergen Reactions  . Codeine     nausea  . Tape     Rash     History   Social History  . Marital Status: Married    Spouse Name: N/A    Number of Children: 4  . Years of Education: N/A   Occupational History  . Not on file.   Social History Main Topics  . Smoking status:   Former Smoker  . Smokeless tobacco: Never Used   Comment: QUIT 1978  . Alcohol Use: No  . Drug Use: No  . Sexually Active: Not Currently    Birth Control/ Protection: Post-menopausal   Other Topics Concern  . Not on file   Social History Narrative   Lives with husband.  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 Heart attack Father 42  . Heart attack Mother 56  . Cancer Mother     Died age 72 with pancreatic cancer, also had uterine and breast cancer    ROS- All systems are reviewed and negative except as per the HPI above  Physical Exam: Filed Vitals:   05/15/12 1526  BP: 149/66  Pulse: 67  Height: 5' 3" (1.6 m)  Weight: 181 lb (82.101 kg)    GEN- The patient is well appearing, alert and oriented x 3 today.   Head- normocephalic,  atraumatic Eyes-  Sclera clear, conjunctiva pink Ears- hearing intact Oropharynx- clear Neck- supple, no JVP Lymph- no cervical lymphadenopathy Lungs- Clear to ausculation bilaterally, normal work of breathing Heart- Regular rate and rhythm, no murmurs, rubs or gallops, PMI not laterally displaced GI- soft, NT, ND, + BS Extremities- no clubbing, cyanosis, or edema MS- no significant deformity or atrophy Skin- no rash or lesion Psych- euthymic mood, full affect Neuro- strength and sensation are intact  Pacemaker interrogation today reveals ongoing afib since starting multaq though her afib burden is decreased  Assessment and Plan:  

## 2012-05-15 NOTE — Assessment & Plan Note (Signed)
As above.

## 2012-05-15 NOTE — Patient Instructions (Signed)
See instruction letter 

## 2012-05-15 NOTE — Assessment & Plan Note (Signed)
The patient has symptomatic paroxysmal atrial fibrillation and typical appearing atrial flutter. Therapeutic strategies including medicine and ablation were discussed in detail with the patient today. Risk, benefits, and alternatives to EP study and radiofrequency ablation were also discussed in detail today. These risks include but are not limited to stroke, bleeding, vascular damage, tamponade, perforation, damage to the esophagus, lungs, and other structures, pulmonary vein stenosis, worsening renal function, and death. The patient understands these risk and wishes to proceed.  We will therefore proceed with catheter ablation at the next available time.  She will continue xarelto and multaq in the interim.

## 2012-05-15 NOTE — Assessment & Plan Note (Signed)
Normal pacemaker function See Pace Art report No changes today  

## 2012-05-16 LAB — BASIC METABOLIC PANEL
BUN: 12 mg/dL (ref 6–23)
CO2: 29 mEq/L (ref 19–32)
Chloride: 103 mEq/L (ref 96–112)
Creatinine, Ser: 1.1 mg/dL (ref 0.4–1.2)
Glucose, Bld: 121 mg/dL — ABNORMAL HIGH (ref 70–99)
Potassium: 3.5 mEq/L (ref 3.5–5.1)

## 2012-05-16 LAB — CBC WITH DIFFERENTIAL/PLATELET
Eosinophils Absolute: 0.2 10*3/uL (ref 0.0–0.7)
HCT: 36.8 % (ref 36.0–46.0)
Lymphs Abs: 2.4 10*3/uL (ref 0.7–4.0)
MCHC: 32.8 g/dL (ref 30.0–36.0)
MCV: 86.8 fl (ref 78.0–100.0)
Monocytes Absolute: 0.4 10*3/uL (ref 0.1–1.0)
Neutrophils Relative %: 52.4 % (ref 43.0–77.0)
Platelets: 359 10*3/uL (ref 150.0–400.0)

## 2012-05-17 ENCOUNTER — Ambulatory Visit (HOSPITAL_COMMUNITY): Payer: Medicare Other

## 2012-05-18 ENCOUNTER — Encounter (HOSPITAL_COMMUNITY): Payer: Self-pay | Admitting: Pharmacy Technician

## 2012-05-19 ENCOUNTER — Ambulatory Visit (HOSPITAL_COMMUNITY): Payer: Medicare Other

## 2012-05-22 ENCOUNTER — Ambulatory Visit (HOSPITAL_COMMUNITY): Payer: Medicare Other

## 2012-05-23 ENCOUNTER — Other Ambulatory Visit: Payer: Self-pay | Admitting: Cardiology

## 2012-05-24 ENCOUNTER — Ambulatory Visit (HOSPITAL_COMMUNITY): Payer: Medicare Other

## 2012-05-25 ENCOUNTER — Encounter: Payer: Self-pay | Admitting: Cardiology

## 2012-05-25 ENCOUNTER — Other Ambulatory Visit: Payer: Self-pay | Admitting: Cardiology

## 2012-05-25 NOTE — H&P (Signed)
Office Visit     Patient: Cheryl [name], Cheryl [name] Provider: Armanda Magic, MD  DOB: [birth year] Age: 66 Y Sex: Female Date: 05/01/2012  Phone: [phone]   Address: [address], [place], [place]  Pcp: ADAKU NNODI       Subjective:     CC:    1. HOSPITAL FOLLOWUP AFIB/START ZARELTO.        HPI:  General:  The patient presents today for followup of her CAD and atrial fibrillation. She was recently in the hospital for afib with RVR and SOB. She is due to come off of her Brilanta this week after having a BMS placed almost 30 days ago. She was seen by Dr. Johney Frame in the hospital and he recommended starting Xarelto once Brilanta stopped. She was placed on Multaq and presents back today for followup. She denies any chest pain, palpitations or dizziness. She has some mild SOB that may be due to the [place]..        ROS:  See HPI, A twelve system review was perfomed at today's visit. For pertinent positives and negatives see HPI.       Medical History: paroxysmal atrial fibrillation CHADs score is 1 therefore only on ASA, Hypertension, Hypothyroidism, Dyslipidemia, GERD, Palpitations, Echo 2/13 nl Turner ,stress test no ischemia, nl LVF EF 80%, unremarkable Lexiscan CL, CAD s/p angioplasyt of first marginal branch.        Family History:        Social History:  General:  History of smoking  cigarettes: Former smoker Quit in year 1972 Pack-year Hx: 5 no Smoking.  no Tobacco Exposure.  no Alcohol.  no Recreational drug use.  Marital Status: married.  Children: 4 children.        Medications: Levothyroxine Sodium 100 MCG Tablet 1 tablet every morning on an empty stomach Once a day, K-Dur 10 MEQ Tablet 2 tablets BID, Aspirin 81 MG Tablet Delayed Release 1 tablet once a day, Nitroglycerin 0.4 mg 0.4 mg tablet 1 tablet as directed as directed prn chest pain, Brilinta 90 MG Tablet 1 tablet twice a day, (maintanence aspirin should be less than 100 mg when used with Brilinta),  Crestor 10 MG Tablet 1 tablet Once a day, Toprol XL 50 MG Tablet Extended Release 1 tablets Once a day, Multaq 400 mg tablet 1 tablet twice a day, Pepcid 20 MG Tablet 1 tablet once a day, Medication List reviewed and reconciled with the patient       Allergies: Latex Gloves, Adhesive Bandages Clear, Codeine (for allergy), Protonix.       Objective:     Vitals: Wt 183.2, Wt change 1.7 lb, Ht 62, BMI 33.50, Pulse sitting 80, BP sitting 126/86.       Examination:  Cardiology, General:  GENERAL APPEARANCE: pleasant, NAD.  HEENT: unremarkable.  CAROTID UPSTROKE: normal, no bruit.  JVD: flat.  HEART SOUNDS: regular, normal S1, S2, no S3 or S4.  MURMUR: absent.  LUNGS: no rales or wheezes.  ABDOMEN: soft, non tender, positive bowel sounds, no masses felt.  EXTREMITIES: no leg edema.  PERIPHERAL PULSES: 2 plus bilateral.        Assessment:     Assessment:  1. Atrial fibrillation - 427.31 (Primary)  2. Essential hypertension, benign - 401.1  3. Medication monitor excluding anticoagulants, antibiotics, antiplatelet, NSAID, steroids, aspirin, or insulin. - V58.69  4. CAD (coronary artery disease), native coronary artery - 414.01    Plan:     1. Atrial fibrillation Continue Multaq tablet, 400  mg, 1 tablet, orally, twice a day ; Continue Toprol XL Tablet Extended Release, 50 MG, 1 tablets, Orally, Once a day ; Start Xarelto 20 mg tablet, 20 mg, 1 tablet, orally, once a day, 30 days, 30, Refills 11 .  Her last dose of Brilanta will be 10/3 and she will start Xarelto 20mg  daily on 10/4 and stop Brilanta. She has an appointment with Dr. Johney Frame in a few weeks to be set up for Afib ablation.       2. Essential hypertension, benign Continue Toprol XL Tablet Extended Release, 50 MG, 1 tablets, Orally, Once a day .       3. Medication monitor excluding anticoagulants, antibiotics, antiplatelet, NSAID, steroids, aspirin, or insulin.  She has remained on potassium due to persistent hypokalemia  in the hospital but she was on diuretics at the time. I will recheck a BMET today and if potassium level is normal I will have her stop her potassium.       4. CAD (coronary artery disease), native coronary artery Continue Aspirin Tablet Delayed Release, 81 MG, 1 tablet, Orally, once a day .        Immunizations:        Diagnostic Imaging: EKG a paced with normal QTc, Harward,Amy 05/01/2012 11:53:04 AM > TURNER,TRACI M 05/01/2012 11:55:19 AM >        Labs:        Procedure Codes: 40981 ECL BMP, 93000 EKG I AND R       Preventive:         Follow Up: 2 Months      Provider: Armanda Magic, MD  Patient: Cheryl [name], Cheryl [name] DOB: 05/27/1946 Date: 05/01/2012

## 2012-05-26 ENCOUNTER — Ambulatory Visit (HOSPITAL_COMMUNITY): Payer: Medicare Other

## 2012-05-29 ENCOUNTER — Encounter (HOSPITAL_COMMUNITY): Payer: Self-pay | Admitting: Gastroenterology

## 2012-05-29 ENCOUNTER — Ambulatory Visit (HOSPITAL_COMMUNITY): Payer: Medicare Other

## 2012-05-29 ENCOUNTER — Ambulatory Visit (HOSPITAL_COMMUNITY)
Admission: RE | Admit: 2012-05-29 | Discharge: 2012-05-29 | Disposition: A | Discharge status: Home / Self Care | Payer: Medicare Other | Source: Ambulatory Visit | Attending: Cardiology | Admitting: Cardiology

## 2012-05-29 ENCOUNTER — Encounter (HOSPITAL_COMMUNITY)
Admission: RE | Disposition: A | Discharge status: Home / Self Care | Payer: Self-pay | Source: Ambulatory Visit | Attending: Cardiology

## 2012-05-29 DIAGNOSIS — I480 Paroxysmal atrial fibrillation: Secondary | ICD-10-CM

## 2012-05-29 DIAGNOSIS — Z950 Presence of cardiac pacemaker: Secondary | ICD-10-CM | POA: Diagnosis not present

## 2012-05-29 DIAGNOSIS — I4891 Unspecified atrial fibrillation: Secondary | ICD-10-CM | POA: Diagnosis not present

## 2012-05-29 DIAGNOSIS — I495 Sick sinus syndrome: Secondary | ICD-10-CM | POA: Diagnosis not present

## 2012-05-29 DIAGNOSIS — I4892 Unspecified atrial flutter: Secondary | ICD-10-CM | POA: Insufficient documentation

## 2012-05-29 HISTORY — PX: TEE WITHOUT CARDIOVERSION: SHX5443

## 2012-05-29 SURGERY — ECHOCARDIOGRAM, TRANSESOPHAGEAL
Anesthesia: Moderate Sedation

## 2012-05-29 MED ORDER — MIDAZOLAM HCL 5 MG/ML IJ SOLN
INTRAMUSCULAR | Status: AC
  Filled 2012-05-29: qty 2

## 2012-05-29 MED ORDER — MIDAZOLAM HCL 10 MG/2ML IJ SOLN
INTRAMUSCULAR | Status: DC | PRN
  Administered 2012-05-29 (×2): 1 mg via INTRAVENOUS

## 2012-05-29 MED ORDER — FENTANYL CITRATE 0.05 MG/ML IJ SOLN
INTRAMUSCULAR | Status: AC
  Filled 2012-05-29: qty 2

## 2012-05-29 MED ORDER — LIDOCAINE VISCOUS 2 % MT SOLN
OROMUCOSAL | Status: AC
  Filled 2012-05-29: qty 15

## 2012-05-29 MED ORDER — LIDOCAINE VISCOUS 2 % MT SOLN
OROMUCOSAL | Status: DC | PRN
  Administered 2012-05-29: 1 via OROMUCOSAL

## 2012-05-29 MED ORDER — BUTAMBEN-TETRACAINE-BENZOCAINE 2-2-14 % EX AERO
INHALATION_SPRAY | CUTANEOUS | Status: DC | PRN
  Administered 2012-05-29: 2 via TOPICAL

## 2012-05-29 MED ORDER — FENTANYL CITRATE 0.05 MG/ML IJ SOLN
INTRAMUSCULAR | Status: DC | PRN
  Administered 2012-05-29: 25 ug via INTRAVENOUS
  Administered 2012-05-29: 12.5 ug via INTRAVENOUS

## 2012-05-29 MED ORDER — SODIUM CHLORIDE 0.9 % IV SOLN
INTRAVENOUS | Status: DC
  Administered 2012-05-29: 500 mL via INTRAVENOUS

## 2012-05-29 NOTE — Interval H&P Note (Signed)
History and Physical Interval Note:  05/29/2012 11:08 AM  Cheryl [name]  has presented today for surgery, with the diagnosis of a-fib  The various methods of treatment have been discussed with the patient and family. After consideration of risks, benefits and other options for treatment, the patient has consented to  Procedure(s) (LRB) with comments: TRANSESOPHAGEAL ECHOCARDIOGRAM (TEE) (N/A) - h/p in file drawer/dl as a surgical intervention .  The patient's history has been reviewed, patient examined, no change in status, stable for surgery.  I have reviewed the patient's chart and labs.  Questions were answered to the patient's satisfaction.     TURNER,TRACI R

## 2012-05-29 NOTE — Progress Notes (Signed)
  Echocardiogram Echocardiogram Transesophageal has been performed.  Cheryl [name] 05/29/2012, 1:36 PM

## 2012-05-29 NOTE — CV Procedure (Signed)
Procedure Note  Procedure:  Transesophageal echocardiogram Operator:  Armanda Magic, MD Complications:  None Indications:  Atrial fibrillation for afib ablation   Results:   Normal LV size and LVF Normal RV size and RVF Normal RA with evidence of pacer wire Normal LA and LA appendage with no evidence of thrombus Normal PV Normal MV with trivial MR Normal trileaflet AV Lipomatous interatrial septum with no evidence of PFO by colorflow doppler Normal thoracic and ascending aorta  Patient tolerated procedure well.

## 2012-05-29 NOTE — H&P (View-Only) (Signed)
Office Visit     Patient: [name], [name] Provider: Caswell Alvillar, MD  DOB: 11/15/1945 Age: 66 Y Sex: Female Date: 05/01/2012  Phone: [phone]   Address: [address], [place], [address]  Pcp: ADAKU NNODI       Subjective:     CC:    1. HOSPITAL FOLLOWUP AFIB/START ZARELTO.        HPI:  General:  The patient presents today for followup of her CAD and atrial fibrillation. She was recently in the hospital for afib with RVR and SOB. She is due to come off of her Brilanta this week after having a BMS placed almost 30 days ago. She was seen by Dr. Allred in the hospital and he recommended starting Xarelto once Brilanta stopped. She was placed on Multaq and presents back today for followup. She denies any chest pain, palpitations or dizziness. She has some mild SOB that may be due to the [place]..        ROS:  See HPI, A twelve system review was perfomed at today's visit. For pertinent positives and negatives see HPI.       Medical History: paroxysmal atrial fibrillation CHADs score is 1 therefore only on ASA, Hypertension, Hypothyroidism, Dyslipidemia, GERD, Palpitations, Echo 2/13 nl Elianie Hubers ,stress test no ischemia, nl LVF EF 80%, unremarkable Lexiscan CL, CAD s/p angioplasyt of first marginal branch.        Family History:        Social History:  General:  History of smoking  cigarettes: Former smoker Quit in year 1972 Pack-year Hx: 5 no Smoking.  no Tobacco Exposure.  no Alcohol.  no Recreational drug use.  Marital Status: married.  Children: 4 children.        Medications: Levothyroxine Sodium 100 MCG Tablet 1 tablet every morning on an empty stomach Once a day, K-Dur 10 MEQ Tablet 2 tablets BID, Aspirin 81 MG Tablet Delayed Release 1 tablet once a day, Nitroglycerin 0.4 mg 0.4 mg tablet 1 tablet as directed as directed prn chest pain, Brilinta 90 MG Tablet 1 tablet twice a day, (maintanence aspirin should be less than 100 mg when used with Brilinta),  Crestor 10 MG Tablet 1 tablet Once a day, Toprol XL 50 MG Tablet Extended Release 1 tablets Once a day, Multaq 400 mg tablet 1 tablet twice a day, Pepcid 20 MG Tablet 1 tablet once a day, Medication List reviewed and reconciled with the patient       Allergies: Latex Gloves, Adhesive Bandages Clear, Codeine (for allergy), Protonix.       Objective:     Vitals: Wt 183.2, Wt change 1.7 lb, Ht 62, BMI 33.50, Pulse sitting 80, BP sitting 126/86.       Examination:  Cardiology, General:  GENERAL APPEARANCE: pleasant, NAD.  HEENT: unremarkable.  CAROTID UPSTROKE: normal, no bruit.  JVD: flat.  HEART SOUNDS: regular, normal S1, S2, no S3 or S4.  MURMUR: absent.  LUNGS: no rales or wheezes.  ABDOMEN: soft, non tender, positive bowel sounds, no masses felt.  EXTREMITIES: no leg edema.  PERIPHERAL PULSES: 2 plus bilateral.        Assessment:     Assessment:  1. Atrial fibrillation - 427.31 (Primary)  2. Essential hypertension, benign - 401.1  3. Medication monitor excluding anticoagulants, antibiotics, antiplatelet, NSAID, steroids, aspirin, or insulin. - V58.69  4. CAD (coronary artery disease), native coronary artery - 414.01    Plan:     1. Atrial fibrillation Continue Multaq tablet, 400   mg, 1 tablet, orally, twice a day ; Continue Toprol XL Tablet Extended Release, 50 MG, 1 tablets, Orally, Once a day ; Start Xarelto 20 mg tablet, 20 mg, 1 tablet, orally, once a day, 30 days, 30, Refills 11 .  Her last dose of Brilanta will be 10/3 and she will start Xarelto 20mg daily on 10/4 and stop Brilanta. She has an appointment with Dr. Allred in a few weeks to be set up for Afib ablation.       2. Essential hypertension, benign Continue Toprol XL Tablet Extended Release, 50 MG, 1 tablets, Orally, Once a day .       3. Medication monitor excluding anticoagulants, antibiotics, antiplatelet, NSAID, steroids, aspirin, or insulin.  She has remained on potassium due to persistent hypokalemia  in the hospital but she was on diuretics at the time. I will recheck a BMET today and if potassium level is normal I will have her stop her potassium.       4. CAD (coronary artery disease), native coronary artery Continue Aspirin Tablet Delayed Release, 81 MG, 1 tablet, Orally, once a day .        Immunizations:        Diagnostic Imaging: EKG a paced with normal QTc, Harward,Amy 05/01/2012 11:53:04 AM > Dreamer Carillo M 05/01/2012 11:55:19 AM >        Labs:        Procedure Codes: 80048 ECL BMP, 93000 EKG I AND R       Preventive:         Follow Up: 2 Months      Provider: Micheale Schlack, MD  Patient: [name], [name] DOB: 10/12/1945 Date: 05/01/2012    

## 2012-05-30 ENCOUNTER — Encounter (HOSPITAL_COMMUNITY): Payer: Self-pay | Admitting: General Practice

## 2012-05-30 ENCOUNTER — Encounter (HOSPITAL_COMMUNITY): Payer: Self-pay | Admitting: *Deleted

## 2012-05-30 ENCOUNTER — Encounter (HOSPITAL_COMMUNITY)
Admission: RE | Disposition: A | Discharge status: Home / Self Care | Payer: Self-pay | Source: Ambulatory Visit | Attending: Internal Medicine

## 2012-05-30 ENCOUNTER — Ambulatory Visit (HOSPITAL_COMMUNITY)
Admission: RE | Admit: 2012-05-30 | Discharge: 2012-05-31 | Disposition: A | Discharge status: Home / Self Care | Payer: Medicare Other | Source: Ambulatory Visit | Attending: Internal Medicine | Admitting: Internal Medicine

## 2012-05-30 ENCOUNTER — Ambulatory Visit (HOSPITAL_COMMUNITY): Payer: Medicare Other | Admitting: *Deleted

## 2012-05-30 DIAGNOSIS — I10 Essential (primary) hypertension: Secondary | ICD-10-CM | POA: Insufficient documentation

## 2012-05-30 DIAGNOSIS — K219 Gastro-esophageal reflux disease without esophagitis: Secondary | ICD-10-CM

## 2012-05-30 DIAGNOSIS — I480 Paroxysmal atrial fibrillation: Secondary | ICD-10-CM

## 2012-05-30 DIAGNOSIS — Z950 Presence of cardiac pacemaker: Secondary | ICD-10-CM

## 2012-05-30 DIAGNOSIS — E7800 Pure hypercholesterolemia, unspecified: Secondary | ICD-10-CM

## 2012-05-30 DIAGNOSIS — F419 Anxiety disorder, unspecified: Secondary | ICD-10-CM

## 2012-05-30 DIAGNOSIS — E669 Obesity, unspecified: Secondary | ICD-10-CM

## 2012-05-30 DIAGNOSIS — I4892 Unspecified atrial flutter: Secondary | ICD-10-CM | POA: Insufficient documentation

## 2012-05-30 DIAGNOSIS — E039 Hypothyroidism, unspecified: Secondary | ICD-10-CM

## 2012-05-30 DIAGNOSIS — I2510 Atherosclerotic heart disease of native coronary artery without angina pectoris: Secondary | ICD-10-CM

## 2012-05-30 DIAGNOSIS — I4891 Unspecified atrial fibrillation: Secondary | ICD-10-CM | POA: Diagnosis not present

## 2012-05-30 DIAGNOSIS — I119 Hypertensive heart disease without heart failure: Secondary | ICD-10-CM

## 2012-05-30 DIAGNOSIS — I495 Sick sinus syndrome: Secondary | ICD-10-CM

## 2012-05-30 HISTORY — DX: Paroxysmal atrial fibrillation: I48.0

## 2012-05-30 HISTORY — DX: Acute myocardial infarction, unspecified: I21.9

## 2012-05-30 HISTORY — PX: ATRIAL FIBRILLATION ABLATION: SHX5456

## 2012-05-30 LAB — CBC
HCT: 34.8 % — ABNORMAL LOW (ref 36.0–46.0)
Hemoglobin: 11.8 g/dL — ABNORMAL LOW (ref 12.0–15.0)
MCH: 28.9 pg (ref 26.0–34.0)
MCHC: 33.9 g/dL (ref 30.0–36.0)
RDW: 13.9 % (ref 11.5–15.5)

## 2012-05-30 LAB — BASIC METABOLIC PANEL
BUN: 14 mg/dL (ref 6–23)
Chloride: 105 mEq/L (ref 96–112)
Creatinine, Ser: 1.04 mg/dL (ref 0.50–1.10)
GFR calc non Af Amer: 55 mL/min — ABNORMAL LOW (ref >90)
Glucose, Bld: 104 mg/dL — ABNORMAL HIGH (ref 70–99)
Potassium: 4 mEq/L (ref 3.5–5.1)

## 2012-05-30 LAB — POCT ACTIVATED CLOTTING TIME: Activated Clotting Time: 289 seconds

## 2012-05-30 SURGERY — ATRIAL FIBRILLATION ABLATION
Anesthesia: Monitor Anesthesia Care

## 2012-05-30 MED ORDER — DEXTROSE 5 % IV SOLN
INTRAVENOUS | Status: AC
  Filled 2012-05-30: qty 250

## 2012-05-30 MED ORDER — LEVOTHYROXINE SODIUM 100 MCG PO TABS
100.0000 ug | ORAL_TABLET | Freq: Every day | ORAL | Status: DC
  Filled 2012-05-30: qty 1

## 2012-05-30 MED ORDER — ARTIFICIAL TEARS OP OINT
TOPICAL_OINTMENT | OPHTHALMIC | Status: DC | PRN
  Administered 2012-05-30: 1 via OPHTHALMIC

## 2012-05-30 MED ORDER — ONDANSETRON HCL 4 MG/2ML IJ SOLN
INTRAMUSCULAR | Status: DC | PRN
  Administered 2012-05-30: 4 mg via INTRAVENOUS

## 2012-05-30 MED ORDER — PROPOFOL 10 MG/ML IV BOLUS
INTRAVENOUS | Status: DC | PRN
  Administered 2012-05-30: 175 mg via INTRAVENOUS

## 2012-05-30 MED ORDER — INFLUENZA VIRUS VACC SPLIT PF IM SUSP
0.5000 mL | INTRAMUSCULAR | Status: DC
  Filled 2012-05-30: qty 0.5

## 2012-05-30 MED ORDER — MIDAZOLAM HCL 5 MG/5ML IJ SOLN
INTRAMUSCULAR | Status: DC | PRN
  Administered 2012-05-30 (×2): 1 mg via INTRAVENOUS

## 2012-05-30 MED ORDER — ONDANSETRON HCL 4 MG/2ML IJ SOLN: 4.0000 mg | Freq: Four times a day (QID) | INTRAMUSCULAR | Status: DC | PRN

## 2012-05-30 MED ORDER — HYDROMORPHONE HCL PF 1 MG/ML IJ SOLN: 0.2500 mg | INTRAMUSCULAR | Status: DC | PRN

## 2012-05-30 MED ORDER — FENTANYL CITRATE 0.05 MG/ML IJ SOLN
INTRAMUSCULAR | Status: DC | PRN
  Administered 2012-05-30: 50 ug via INTRAVENOUS
  Administered 2012-05-30 (×4): 25 ug via INTRAVENOUS
  Administered 2012-05-30: 75 ug via INTRAVENOUS
  Administered 2012-05-30: 25 ug via INTRAVENOUS

## 2012-05-30 MED ORDER — METOPROLOL SUCCINATE ER 50 MG PO TB24
50.0000 mg | ORAL_TABLET | Freq: Every day | ORAL | Status: DC
  Administered 2012-05-30 – 2012-05-31 (×2): 50 mg via ORAL
  Filled 2012-05-30 (×2): qty 1

## 2012-05-30 MED ORDER — SODIUM CHLORIDE 0.9 % IJ SOLN: 3.0000 mL | INTRAMUSCULAR | Status: DC | PRN

## 2012-05-30 MED ORDER — SODIUM CHLORIDE 0.9 % IV SOLN: 250.0000 mL | INTRAVENOUS | Status: DC | PRN

## 2012-05-30 MED ORDER — NITROGLYCERIN 0.4 MG SL SUBL: 0.4000 mg | SUBLINGUAL_TABLET | SUBLINGUAL | Status: DC | PRN

## 2012-05-30 MED ORDER — OFF THE BEAT BOOK
Freq: Once | Status: AC
  Administered 2012-05-30: 17:00:00
  Filled 2012-05-30: qty 1

## 2012-05-30 MED ORDER — SODIUM CHLORIDE 0.9 % IV SOLN
INTRAVENOUS | Status: DC | PRN
  Administered 2012-05-30: 07:00:00 via INTRAVENOUS

## 2012-05-30 MED ORDER — HYDROCODONE-ACETAMINOPHEN 5-325 MG PO TABS
1.0000 | ORAL_TABLET | ORAL | Status: DC | PRN
  Administered 2012-05-30: 1 via ORAL
  Filled 2012-05-30: qty 1

## 2012-05-30 MED ORDER — LIDOCAINE HCL (CARDIAC) 20 MG/ML IV SOLN
INTRAVENOUS | Status: DC | PRN
  Administered 2012-05-30: 70 mg via INTRAVENOUS

## 2012-05-30 MED ORDER — HYDROXYUREA 500 MG PO CAPS
ORAL_CAPSULE | ORAL | Status: AC
  Filled 2012-05-30: qty 1

## 2012-05-30 MED ORDER — SODIUM CHLORIDE 0.9 % IJ SOLN
3.0000 mL | Freq: Two times a day (BID) | INTRAMUSCULAR | Status: DC
  Administered 2012-05-30: 3 mL via INTRAVENOUS

## 2012-05-30 MED ORDER — PROTAMINE SULFATE 10 MG/ML IV SOLN
INTRAVENOUS | Status: DC | PRN
  Administered 2012-05-30 (×4): 10 mg via INTRAVENOUS

## 2012-05-30 MED ORDER — LACTATED RINGERS IV SOLN: INTRAVENOUS | Status: DC | PRN

## 2012-05-30 MED ORDER — PNEUMOCOCCAL VAC POLYVALENT 25 MCG/0.5ML IJ INJ
0.5000 mL | INJECTION | INTRAMUSCULAR | Status: DC
  Filled 2012-05-30: qty 0.5

## 2012-05-30 MED ORDER — LEVOTHYROXINE SODIUM 100 MCG PO TABS
100.0000 ug | ORAL_TABLET | Freq: Every day | ORAL | Status: DC
  Administered 2012-05-30 – 2012-05-31 (×2): 100 ug via ORAL
  Filled 2012-05-30 (×3): qty 1

## 2012-05-30 MED ORDER — ONDANSETRON HCL 4 MG/2ML IJ SOLN: 4.0000 mg | Freq: Once | INTRAMUSCULAR | Status: DC | PRN

## 2012-05-30 MED ORDER — RIVAROXABAN 20 MG PO TABS
20.0000 mg | ORAL_TABLET | Freq: Every day | ORAL | Status: DC
  Filled 2012-05-30: qty 1

## 2012-05-30 MED ORDER — BUPIVACAINE HCL (PF) 0.25 % IJ SOLN
INTRAMUSCULAR | Status: AC
  Filled 2012-05-30: qty 60

## 2012-05-30 MED ORDER — ACETAMINOPHEN 325 MG PO TABS
650.0000 mg | ORAL_TABLET | ORAL | Status: DC | PRN
  Filled 2012-05-30: qty 2

## 2012-05-30 MED ORDER — ISOPROTERENOL HCL 0.2 MG/ML IJ SOLN
1000.0000 ug | INTRAVENOUS | Status: DC | PRN
  Administered 2012-05-30: 20 ug/min via INTRAVENOUS

## 2012-05-30 MED ORDER — HEPARIN SODIUM (PORCINE) 1000 UNIT/ML IJ SOLN
INTRAMUSCULAR | Status: AC
  Administered 2012-05-30: 4000 [IU] via INTRAVENOUS
  Filled 2012-05-30: qty 1

## 2012-05-30 MED ORDER — FAMOTIDINE 20 MG PO TABS
20.0000 mg | ORAL_TABLET | Freq: Every day | ORAL | Status: DC
  Administered 2012-05-30 – 2012-05-31 (×2): 20 mg via ORAL
  Filled 2012-05-30 (×2): qty 1

## 2012-05-30 NOTE — Anesthesia Preprocedure Evaluation (Addendum)
Anesthesia Evaluation  Patient identified by MRN, date of birth, ID band Patient awake    Reviewed: Allergy & Precautions, H&P , NPO status , Patient's Chart, lab work & pertinent test results  Airway Mallampati: I TM Distance: >3 FB Neck ROM: full    Dental   Pulmonary shortness of breath,          Cardiovascular hypertension, + CAD + dysrhythmias Atrial Fibrillation + pacemaker Rhythm:irregular Rate:Normal     Neuro/Psych PSYCHIATRIC DISORDERS Anxiety    GI/Hepatic hiatal hernia, GERD-  ,  Endo/Other  Hypothyroidism   Renal/GU      Musculoskeletal   Abdominal   Peds  Hematology   Anesthesia Other Findings   Reproductive/Obstetrics                          Anesthesia Physical Anesthesia Plan  ASA: III  Anesthesia Plan: General   Post-op Pain Management:    Induction: Intravenous  Airway Management Planned: LMA  Additional Equipment:   Intra-op Plan:   Post-operative Plan: Extubation in OR  Informed Consent: I have reviewed the patients History and Physical, chart, labs and discussed the procedure including the risks, benefits and alternatives for the proposed anesthesia with the patient or authorized representative who has indicated his/her understanding and acceptance.     Plan Discussed with: CRNA, Anesthesiologist and Surgeon  Anesthesia Plan Comments:         Anesthesia Quick Evaluation

## 2012-05-30 NOTE — Discharge Summary (Signed)
ELECTROPHYSIOLOGY PROCEDURE DISCHARGE SUMMARY    Patient ID: Cheryl [name],  MRN: [id number], DOB/AGE: [id number] y.o.  Admit date: 05/30/2012 Discharge date: 05/31/2012  Primary Care Physician: Gretel Acre, MD Primary Cardiologist: Armanda Magic, MD Electrophysiologist: Hillis Range, MD  Primary Discharge Diagnosis:  Atrial fibrillation and atrial flutter status post ablation this admission  Secondary Discharge Diagnosis:  1.  Hypertension 2.  GERD 3.  Hypercholesteremia 4.  Hypothyroidism 5.  Anemia 6.  Arthritis 7.  Chronic anticoagulation with Xarelto 8.  Tachy brady syndrome status post STJ pacemaker implantation 10-28-2011  Procedures This Admission:  1.  Electrophysiology study and radiofrequency catheter ablation on 05-30-2012 by Dr Johney Frame.  This study demonstrated sinus rhythm upon presentation, rotational Angiography reveals a moderate sized left atrium with four separate pulmonary veins without evidence of pulmonary vein stenosis, successful electrical isolation and anatomical encircling of all four pulmonary veins with radiofrequency current, cavo-tricuspid isthmus ablation was performed with complete bidirectional isthmus block achieved, no inducible arrhythmias following ablation both on and off of Isuprel, and no early apparent complications.  Brief HPI: Cheryl [name] is a 66 y.o. female with a history of paroxysmal atrial fibrillation and typical appearing atrial flutter who now presents for EP study and radiofrequency ablation. The patient reports initially being diagnosed after presenting with symptomatic palpitations and fatgiue. The patient reports increasing frequency and duration of atrial arrhythmias since that time. The patient has failed medical therapy with Multaq and flecainide. The patient therefore presents today for catheter ablation of atrial fibrillation and atrial flutter.  Hospital Course:  The patient was admitted and underwent  electrophysiology study and radiofrequency catheter ablation of atrial fibrillation and atrial flutter with details as outlined above.  Her device was interrogated post procedure and found to be functioning normally.  She was monitored on telemetry overnight which demonstrated atrial pacing with intrinsic ventricular conduction.  Her groin incisions were without hematoma or bruit.  Dr Johney Frame examined the patient on 05-31-2012 and considered her stable for discharge to home.  Plans at discharge were to discontinue Multaq.    Discharge Vitals: Blood pressure 121/58, pulse 67, temperature 98.2 F (36.8 C), temperature source Oral, resp. rate 15, height 5\' 3"  (1.6 m), weight 181 lb 14.1 oz (82.5 kg), SpO2 98.00%.    Labs:   Lab Results  Component Value Date   WBC 6.3 05/30/2012   HGB 11.8* 05/30/2012   HCT 34.8* 05/30/2012   MCV 85.3 05/30/2012   PLT 284 05/30/2012     Lab 05/30/12 0618  NA 142  K 4.0  CL 105  CO2 27  BUN 14  CREATININE 1.04  CALCIUM 8.9  PROT --  BILITOT --  ALKPHOS --  ALT --  AST --  GLUCOSE 104*    Discharge Medications:    Medication List     As of 05/31/2012  9:45 AM    STOP taking these medications         dronedarone 400 MG tablet   Commonly known as: MULTAQ      TAKE these medications         aspirin 81 MG tablet   Take 81 mg by mouth daily.      CRESTOR 10 MG tablet   Generic drug: rosuvastatin   Take 10 mg by mouth Daily.      famotidine 20 MG tablet   Commonly known as: PEPCID   Take 1 tablet (20 mg total) by mouth daily.  fluticasone 50 MCG/ACT nasal spray   Commonly known as: FLONASE   Place 1 spray into the nose daily as needed. Dry nasal      levothyroxine 100 MCG tablet   Commonly known as: SYNTHROID, LEVOTHROID   Take 100 mcg by mouth Daily.      loratadine 10 MG tablet   Commonly known as: CLARITIN   Take 10 mg by mouth daily as needed. For allergies      metoprolol succinate 50 MG 24 hr tablet   Commonly known  as: TOPROL-XL   Take 50 mg by mouth daily. Take with or immediately following a meal.      nitroGLYCERIN 0.4 MG SL tablet   Commonly known as: NITROSTAT   Place 1 tablet (0.4 mg total) under the tongue every 5 (five) minutes x 3 doses as needed for chest pain.      XARELTO 20 MG Tabs   Generic drug: Rivaroxaban   Take 20 mg by mouth daily.          Discharge Orders    Future Appointments: Provider: Department: Dept Phone: Center:   08/31/2012 2:00 PM Hillis Range, MD [facility] [phone] [facility]     Future Orders Please Complete By Expires   Diet - low sodium heart healthy      Increase activity slowly      Discharge instructions      Comments:   Please see post ablation discharge instructions     Follow-up Information    Follow up with Hillis Range, MD. On 08/31/2012. (At 2:00 PM)    Contact information:   [address] [place] [address] [phone]        Duration of Discharge Encounter: Greater than 30 minutes including physician time.   Hillis Range, MD

## 2012-05-30 NOTE — Op Note (Addendum)
SURGEON:  Hillis Range, MD  PREPROCEDURE DIAGNOSES: 1. Paroxysmal atrial fibrillation. 2. Typical appearing atrial flutter  POSTPROCEDURE DIAGNOSES: 1. Paroxysmal  atrial fibrillation. 2. Typical appearing atrial flutter  PROCEDURES: 1. Comprehensive electrophysiologic study. 2. Coronary sinus pacing and recording. 3. Three-dimensional mapping of atrial fibrillation with additional mapping and ablation of a second discrete focus (atrial flutter) 4. Ablation of atrial fibrillation with additional mapping and ablation of a second discrete focus (atrial flutter) 5. Intracardiac echocardiography. 6. Transseptal puncture of an intact septum. 7. Rotational Angiography with processing at an independent workstation 8. Arrhythmia induction with pacing with isuprel infusion 9. Pacemaker interrogation and reprogramming  INTRODUCTION:  Cheryl [name] is a 66 y.o. female with a history of paroxysmal atrial fibrillation and typical appearing atrial flutter who now presents for EP study and radiofrequency ablation.  The patient reports initially being diagnosed after presenting with symptomatic palpitations and fatgiue. The patient reports increasing frequency and duration of atrial arrhythmias since that time.  The patient has failed medical therapy with Multaq and flecainide.  The patient therefore presents today for catheter ablation of atrial fibrillation and atrial flutter.  DESCRIPTION OF PROCEDURE:  Informed written consent was obtained, and the patient was brought to the electrophysiology lab in a fasting state.  The patient was adequately sedated with intravenous medications as outlined in the anesthesia report.  The patient's left and right groins were prepped and draped in the usual sterile fashion by the EP lab staff.  Using a percutaneous Seldinger technique, two 7-French and one 8-French hemostasis sheaths were placed into the right common femoral vein.  A 4- [place] hemostasis sheath was placed  in the right common femoral artery for blood pressure monitoring.  An 11-French hemostasis sheath was placed into the left common femoral vein.   3 Dimensional Rotational Angiography: A 5 french pigtail catheter was introduced through the right common femoral vein and advanced into the inferior venocava.  3 demential rotational angiography was then performed by power injection of 100cc of nonionic contrast.  Reprocessing at an independent work station was then performed.   This demonstrated a moderate sized left atrium with 4 separate pulmonary veins which were also moderate in size.  There were no anomalous veins or significant abnormalities.  A 3 dimensional rendering of the left atrium was then merged using [employer] onto the WellPoint system and registered with intracardiac echo (see below).  The pigtail catheter was then removed.  Catheter Placement:  A 7-French Biosense Webster Decapolar coronary sinus catheter was introduced through the right common femoral vein and advanced into the coronary sinus for recording and pacing from this location.  A 6-French quadripolar Josephson catheter was introduced through the right common femoral vein and advanced into the right ventricle for recording and pacing.  This catheter was then pulled back to the His bundle location.    Initial Measurements: The patient presented to the electrophysiology lab in sinus rhythm.  His PR interval measured 160 msec with a QRS duringation of and a QT interval of 457 msec.  The AH interval measured 129 and the HV interval measured 41 msec.     Intracardiac Echocardiography: A 10-French Biosense Webster AcuNav intracardiac echocardiography catheter was introduced through the left common femoral vein and advanced into the right atrium. Intracardiac echocardiography was performed of the left atrium, and a three-dimensional anatomical rendering of the left atrium was performed using CARTO sound technology.   The patient was noted to have a moderate sized  left atrium.  The interatrial septum was prominent but not aneurysmal. All 4 pulmonary veins were visualized and noted to have separate ostia.  The pulmonary veins were moderate in size.  The left atrial appendage was visualized and did not reveal thrombus.   There was no evidence of pulmonary vein stenosis.   Transseptal Puncture: The middle right common femoral vein sheath was exchanged for an 8.5 [place] SL2 transseptal sheath and transseptal access was achieved in a standard fashion using a Brockenbrough needle under biplane fluoroscopy with intracardiac echocardiography confirmation of the transseptal puncture.  Once transseptal access had been achieved, heparin was administered intravenously and intra- arterially in order to maintain an ACT of greater than 350 seconds throughout the procedure.    3D Mapping and Ablation: The His bundle catheter was removed and in its place a 3.5 mm Biosense Webster EZ [employer] ablation catheter was advanced into the right atrium.  The transseptal sheath was pulled back into the IVC over a guidewire.  The ablation catheter was advanced across the transseptal hole using the wire as a guide.  The transseptal sheath was then re-advanced over the guidewire into the left atrium.  A duodecapolar Biosense Webster circular mapping catheter was introduced through the transseptal sheath and positioned over the mouth of all 4 pulmonary veins.  Three-dimensional electroanatomical mapping was performed using CARTO technology.  This demonstrated electrical activity within all four pulmonary veins at baseline. The patient underwent successful sequential electrical isolation and anatomical encircling of all four pulmonary veins using radiofrequency current with a circular mapping catheter as a guide.  There was prodigious conduction within the right superior pulmonary vein.  The patient had frequent afib during the case which  terminated after isolation of the right superior pulmonary vein The ablation catheter was then pulled back into the right atrial and positioned along the cavo-tricuspid isthmus.  Mapping along the atrial side of the isthmus was performed.  This demonstrated a standard isthmus.  A series of radiofrequency applications were then delivered along the isthmus.  Complete bidirectional cavotricuspid isthmus block was achieved as confirmed by differential atrial pacing from the low lateral right atrium.  A stimulus to earliest atrial activation across the isthmus measured 150 msec bi-directionally.  The patient was observe without return of conduction through the isthmus.  Measurements Following Ablation: Following ablation, Isuprel was infused up to 20 mcg/min with no inducible atrial fibrillation, atrial tachycardia, atrial flutter, or sustained PACs. In sinus rhythm with RR interval was 939, with PR , QRS 94 msec, and Qtc 418 msec.  Following ablation the AH interval measured with an HV interval of 41 msec. Ventricular pacing was performed, which revealed VA dissociation.  Rapid atrial pacing was performed, which revealed an AV Wenckebach cycle length of 400 msec.  Electroisolation was then again confirmed in all four pulmonary veins.  The procedure was therefore considered completed.  All catheters were removed, and the sheaths were aspirated and flushed.   The patients SJM Accent DR RF pacemaker  (implanted 10/28/11) was interrogated and found to be functioning appropriately in the DDDR pacing mode.  The atrial lead impedance was 440 ohms with a threshold of 1.25V@ms  and p waves of 3.20mV.  The right ventricular lead impedance was 490 ohms with a threshold of 1.0V@0 .4ms and R waves of >44mV.  These findings were felt to be stable for the patient.  No changes were made today.  The patient was transferred to the recovery area for sheath removal per protocol.  A limited bedside transthoracic  echocardiogram revealed no pericardial effusion.  There were no early apparent complications.  CONCLUSIONS: 1. Sinus rhythm upon presentation.   2. Rotational Angiography reveals a moderate sized left atrium with four separate pulmonary veins without evidence of pulmonary vein stenosis. 3. Successful electrical isolation and anatomical encircling of all four pulmonary veins with radiofrequency current. 4. Cavo-tricuspid isthmus ablation was performed with complete bidirectional isthmus block achieved.  5. No inducible arrhythmias following ablation both on and off of Isuprel 6. No early apparent complications.   Tela Kotecki,MD 11:14 AM 05/30/2012

## 2012-05-30 NOTE — H&P (View-Only) (Signed)
Primary Care Physician: Gretel Acre, MD Referring Physician:  Dr [name] is a 66 y.o. female with a h/o paroxysmal atrial fibrillation who presents for further EP followup and consideration of ablation.   She was diagnosed with atrial fibrillation several years ago. She was observed to have post termination pauses and therefore underwent PPM implantation by Dr Ladona Ridgel. She was placed on flecainide with good control of her afib. Unfortunately, she recently underwent cath revealing high grade stenosis of OM1 for which a bare metal stent was placed. She was taken off of flecainide at that time. She reports having very frequent episodes of afib and was admitted 9/13 when she presented to [facility] yesterday with atrial flutter with 2:1 conduction. This degenerated into atrial fibrillation. She converted overnight to sinus rhythm.  She was placed on multaq with plans to return today to consider ablation.  Today, she denies symptoms of palpitations, chest pain, shortness of breath, orthopnea, PND, lower extremity edema, dizziness, presyncope, syncope, or neurologic sequela. The patient is tolerating medications without difficulties and is otherwise without complaint today.    Past Medical History  Diagnosis Date  . HTN (hypertension)   . Hypercholesteremia   . GERD (gastroesophageal reflux disease)   . Hypothyroidism   . Anemia   . Blood transfusion     " no reaction "  . H/O hiatal hernia   . Arthritis   . PAF (paroxysmal atrial fibrillation)   . Tachy-brady syndrome     a. post-termination pauses in setting of PAF;  b. 10/28/11 - [address] Jude Dual Chamber PPM SN [id number]    Past Surgical History  Procedure Date  . Permanent pacemaker 10/28/2011  . Breast surgery     Benign lump    Current Outpatient Prescriptions  Medication Sig Dispense Refill  . aspirin 81 MG tablet Take 81 mg by mouth daily.      . CRESTOR 10 MG tablet Take 10 mg by mouth Daily.      Marland Kitchen dronedarone  (MULTAQ) 400 MG tablet Take 1 tablet (400 mg total) by mouth 2 (two) times daily with a meal.  60 tablet  60  . famotidine (PEPCID) 20 MG tablet Take 1 tablet (20 mg total) by mouth daily.  30 tablet  12  . fluticasone (FLONASE) 50 MCG/ACT nasal spray Place into the nose as directed.       Marland Kitchen levothyroxine (SYNTHROID, LEVOTHROID) 100 MCG tablet Take 100 mcg by mouth Daily.      Marland Kitchen loratadine (CLARITIN) 10 MG tablet Take 10 mg by mouth daily.      . metoprolol succinate (TOPROL-XL) 50 MG 24 hr tablet Take 50 mg by mouth daily. Take with or immediately following a meal.      . nitroGLYCERIN (NITROSTAT) 0.4 MG SL tablet Place 1 tablet (0.4 mg total) under the tongue every 5 (five) minutes x 3 doses as needed for chest pain.  25 tablet  5  . XARELTO 20 MG TABS Take 20 mg by mouth daily.         Allergies  Allergen Reactions  . Codeine     nausea  . Tape     Rash     History   Social History  . Marital Status: Married    Spouse Name: N/A    Number of Children: 4  . Years of Education: N/A   Occupational History  . Not on file.   Social History Main Topics  . Smoking status:  Former Smoker  . Smokeless tobacco: Never Used   Comment: QUIT 1978  . Alcohol Use: No  . Drug Use: No  . Sexually Active: Not Currently    Birth Control/ Protection: Post-menopausal   Other Topics Concern  . Not on file   Social History Narrative   Lives with husband.  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 Heart attack Father 47  . Heart attack Mother 11  . Cancer Mother     Died age 20 with pancreatic cancer, also had uterine and breast cancer    ROS- All systems are reviewed and negative except as per the HPI above  Physical Exam: Filed Vitals:   05/15/12 1526  BP: 149/66  Pulse: 67  Height: 5\' 3"  (1.6 m)  Weight: 181 lb (82.101 kg)    GEN- The patient is well appearing, alert and oriented x 3 today.   Head- normocephalic,  atraumatic Eyes-  Sclera clear, conjunctiva pink Ears- hearing intact Oropharynx- clear Neck- supple, no JVP Lymph- no cervical lymphadenopathy Lungs- Clear to ausculation bilaterally, normal work of breathing Heart- Regular rate and rhythm, no murmurs, rubs or gallops, PMI not laterally displaced GI- soft, NT, ND, + BS Extremities- no clubbing, cyanosis, or edema MS- no significant deformity or atrophy Skin- no rash or lesion Psych- euthymic mood, full affect Neuro- strength and sensation are intact  Pacemaker interrogation today reveals ongoing afib since starting multaq though her afib burden is decreased  Assessment and Plan:

## 2012-05-30 NOTE — Preoperative (Signed)
Beta Blockers   Reason not to administer Beta Blockers:Not Applicable 

## 2012-05-30 NOTE — Anesthesia Postprocedure Evaluation (Signed)
  Anesthesia Post-op Note  Patient: Cheryl [name]  Procedure(s) Performed: Procedure(s) (LRB) with comments: ATRIAL FIBRILLATION ABLATION (N/A)  Patient Location: Cath Lab  Anesthesia Type:General  Level of Consciousness: awake, alert , oriented and patient cooperative  Airway and Oxygen Therapy: Patient Spontanous Breathing  Post-op Pain: none  Post-op Assessment: Post-op Vital signs reviewed, Patient's Cardiovascular Status Stable, Respiratory Function Stable, Patent Airway, No signs of Nausea or vomiting and Pain level controlled  Post-op Vital Signs: stable  Complications: No apparent anesthesia complications

## 2012-05-30 NOTE — Transfer of Care (Signed)
Immediate Anesthesia Transfer of Care Note  Patient: Cheryl [name]  Procedure(s) Performed: Procedure(s) (LRB) with comments: ATRIAL FIBRILLATION ABLATION (N/A)  Patient Location: Cath Lab  Anesthesia Type:General  Level of Consciousness: awake, alert  and oriented  Airway & Oxygen Therapy: Patient Spontanous Breathing and Patient connected to nasal cannula oxygen  Post-op Assessment: Report given to PACU RN and Post -op Vital signs reviewed and stable  Post vital signs: Reviewed and stable  Complications: No apparent anesthesia complications

## 2012-05-30 NOTE — Anesthesia Procedure Notes (Signed)
Procedure Name: LMA Insertion Date/Time: 05/30/2012 7:48 AM Performed by: Gayla Medicus Pre-anesthesia Checklist: Patient identified Patient Re-evaluated:Patient Re-evaluated prior to inductionOxygen Delivery Method: Circle system utilized Preoxygenation: Pre-oxygenation with 100% oxygen Intubation Type: IV induction LMA: LMA with gastric port inserted LMA Size: 4.0 Number of attempts: 1 Placement Confirmation: positive ETCO2 and breath sounds checked- equal and bilateral Tube secured with: Tape Dental Injury: Teeth and Oropharynx as per pre-operative assessment

## 2012-05-30 NOTE — Interval H&P Note (Signed)
History and Physical Interval Note:  05/30/2012 11:13 AM  Cheryl [name]  has presented today for surgery, with the diagnosis of afib  The various methods of treatment have been discussed with the patient and family. After consideration of risks, benefits and other options for treatment, the patient has consented to  Procedure(s) (LRB) with comments: ATRIAL FIBRILLATION ABLATION (N/A) as a surgical intervention .  The patient's history has been reviewed, patient examined, no change in status, stable for surgery.  I have reviewed the patient's chart and labs.  Questions were answered to the patient's satisfaction.     Hillis Range

## 2012-05-31 ENCOUNTER — Ambulatory Visit (HOSPITAL_COMMUNITY): Payer: Medicare Other

## 2012-05-31 DIAGNOSIS — I4892 Unspecified atrial flutter: Secondary | ICD-10-CM | POA: Diagnosis not present

## 2012-05-31 DIAGNOSIS — I4891 Unspecified atrial fibrillation: Secondary | ICD-10-CM | POA: Diagnosis not present

## 2012-05-31 DIAGNOSIS — I10 Essential (primary) hypertension: Secondary | ICD-10-CM | POA: Diagnosis not present

## 2012-05-31 MED ORDER — DRONEDARONE HCL 400 MG PO TABS: 400.0000 mg | ORAL_TABLET | Freq: Two times a day (BID) | ORAL | Status: DC

## 2012-05-31 MED ORDER — RIVAROXABAN 20 MG PO TABS
20.0000 mg | ORAL_TABLET | Freq: Every morning | ORAL | Status: DC
  Administered 2012-05-31: 10:00:00 20 mg via ORAL
  Filled 2012-05-31: qty 1

## 2012-05-31 NOTE — Progress Notes (Signed)
   ELECTROPHYSIOLOGY ROUNDING NOTE    Patient Name: MACKYNZIE WOOLFORD Date of Encounter: 05-31-2012    SUBJECTIVE:Patient feels well.  No chest pain or shortness of breath. States her throat is sore this morning.  Some oozing last night from right groin, now resolved.  S/p RFCA of atrial fibrillation and atrial flutter 05-30-2012  TELEMETRY: Reviewed telemetry pt atrial pacing with intrinsic ventricular conduction Filed Vitals:   05/30/12 1315 05/30/12 1900 05/30/12 2000 05/31/12 0000  BP:  117/61 124/64 117/61  Pulse:   64 60  Temp: 98.5 F (36.9 C)  98.7 F (37.1 C) 97.6 F (36.4 C)  TempSrc: Oral  Oral Oral  Resp:  13 17 12   Height:      Weight:      SpO2:   99% 96%   GEN- The patient is well appearing, alert and oriented x 3 today.   Head- normocephalic, atraumatic Eyes-  Sclera clear, conjunctiva pink Ears- hearing intact Oropharynx- clear Neck- supple, no JVP Lymph- no cervical lymphadenopathy Lungs- Clear to ausculation bilaterally, normal work of breathing Heart- Regular rate and rhythm, no murmurs, rubs or gallops, PMI not laterally displaced GI- soft, NT, ND, + BS Extremities- no clubbing, cyanosis, or edema, no hematoma/ bruit Neuro- strength and sensation are intact  Intake/Output Summary (Last 24 hours) at 05/31/12 0618 Last data filed at 05/30/12 1115  Gross per 24 hour  Intake    800 ml  Output      0 ml  Net    800 ml    LABS: Basic Metabolic Panel:  Basename 05/30/12 0618  NA 142  K 4.0  CL 105  CO2 27  GLUCOSE 104*  BUN 14  CREATININE 1.04  CALCIUM 8.9  MG --  PHOS --   CBC:  Basename 05/30/12 0618  WBC 6.3  NEUTROABS --  HGB 11.8*  HCT 34.8*  MCV 85.3  PLT 284    Assessment and Plan:  1. Afib/ atrial flutter Doing well s/p ablation Stop multaq Continue xarelto  Routine follow up scheduled.  Wound care, activity restrictions reviewed with patient.  DC to home today  Jarold Song

## 2012-05-31 NOTE — Progress Notes (Signed)
Nurse called - Ms. Glinski reverted back to AFib prior to DC with HR 120 bpm. I spoke with Dr. Johney Frame who recommended she resume/continue Multaq 400 mg PO BID in addition to metoprolol. Dr. Johney Frame states she is stable for DC this AM.

## 2012-06-02 ENCOUNTER — Ambulatory Visit (HOSPITAL_COMMUNITY): Payer: Medicare Other

## 2012-06-05 ENCOUNTER — Ambulatory Visit (HOSPITAL_COMMUNITY): Payer: Medicare Other

## 2012-06-07 ENCOUNTER — Telehealth: Payer: Self-pay | Admitting: *Deleted

## 2012-06-07 ENCOUNTER — Ambulatory Visit (HOSPITAL_COMMUNITY): Payer: Medicare Other

## 2012-06-07 NOTE — Telephone Encounter (Signed)
Spoke with patient.  She states she went out of rhythm Monday morning, and since she had an appointment at [facility] that day for pacemaker interrogation, she presented there.  She did not feel overwhelmingly bad, just knew that she was out of rhythm.  That device interrogation shows atrial flutter with RVR at a rate of around 150bpm.  We attempted to contact patient yesterday by leaving message.  Was able to speak with her today.  She states that she went back into normal rhythm later Monday evening and has been in normal rhythm since.  She feels well with no chest pain or shortness of breath. She was discharged on Multaq post afib ablation.  Pt advised to call Tresa Endo with Dr Johney Frame with prolonged episodes of atrial arrhythmias.  Pt aware and agrees.

## 2012-06-09 ENCOUNTER — Ambulatory Visit (HOSPITAL_COMMUNITY): Payer: Medicare Other

## 2012-06-12 ENCOUNTER — Ambulatory Visit (HOSPITAL_COMMUNITY): Payer: Medicare Other

## 2012-06-13 ENCOUNTER — Telehealth: Payer: Self-pay | Admitting: Internal Medicine

## 2012-06-13 ENCOUNTER — Encounter: Payer: Self-pay | Admitting: Internal Medicine

## 2012-06-13 NOTE — Telephone Encounter (Signed)
lmom for patient to return my call 

## 2012-06-13 NOTE — Telephone Encounter (Signed)
plz return call to pt [phone], she has post ablation questions.

## 2012-06-13 NOTE — Telephone Encounter (Signed)
This encounter was created in error - please disregard.

## 2012-06-13 NOTE — Telephone Encounter (Signed)
Spoke with patient and let her know that the soreness is normal s/p ablation.  She says she is going to take Tylenol as needed and I have advised warm compress to see if this will help.  She will call me back if it doesn't get better

## 2012-06-13 NOTE — Telephone Encounter (Signed)
Pt leg is hurting in her and she wants to ask about that

## 2012-06-13 NOTE — Telephone Encounter (Signed)
Cheryl [name] 06/13/2012 4:38 PM Signed  Pt leg is hurting in her and she wants to ask about that

## 2012-06-14 ENCOUNTER — Ambulatory Visit (HOSPITAL_COMMUNITY): Payer: Medicare Other

## 2012-06-15 ENCOUNTER — Telehealth: Payer: Self-pay | Admitting: Internal Medicine

## 2012-06-15 NOTE — Telephone Encounter (Signed)
Spoke with patient and let her know to continue to keep a watch on the bruising in her neck.  If it worsens then she will let us know.  Her legs are much better and she is going to continue to walk and hope that it will continue to improve with time

## 2012-06-15 NOTE — Telephone Encounter (Signed)
plz return call to pt at hm# to answering questions about bruising

## 2012-06-16 ENCOUNTER — Ambulatory Visit (HOSPITAL_COMMUNITY): Payer: Medicare Other

## 2012-06-17 ENCOUNTER — Encounter (HOSPITAL_COMMUNITY): Payer: Self-pay | Admitting: *Deleted

## 2012-06-19 ENCOUNTER — Ambulatory Visit (HOSPITAL_COMMUNITY): Payer: Medicare Other

## 2012-06-20 ENCOUNTER — Telehealth: Payer: Self-pay | Admitting: Internal Medicine

## 2012-06-20 NOTE — Telephone Encounter (Signed)
Spoke with patient.  After discussing with Kennon Rounds patient is going to try Ibuprofen 800mg  for her leg pain.  The pain is in the back of her leg and runs up into her buttocks.  She tripped over her dog shortly after coming home and doesn't know if she pulled something but the pain last night was terrible.  She is going to call me back Thurs or Fri if not better and I will talk with Dr Johney Frame.  She may need to go see her PCP

## 2012-06-20 NOTE — Telephone Encounter (Signed)
Pt calling re her leg, had ablation about 3 weeks ago, was up all night with like a cramping feeling, what can she do?

## 2012-06-21 ENCOUNTER — Ambulatory Visit (HOSPITAL_COMMUNITY): Payer: Medicare Other

## 2012-06-22 ENCOUNTER — Telehealth: Payer: Self-pay | Admitting: Internal Medicine

## 2012-06-22 DIAGNOSIS — Z79899 Other long term (current) drug therapy: Secondary | ICD-10-CM | POA: Diagnosis not present

## 2012-06-22 DIAGNOSIS — E785 Hyperlipidemia, unspecified: Secondary | ICD-10-CM | POA: Diagnosis not present

## 2012-06-22 DIAGNOSIS — I2510 Atherosclerotic heart disease of native coronary artery without angina pectoris: Secondary | ICD-10-CM | POA: Diagnosis not present

## 2012-06-22 DIAGNOSIS — I10 Essential (primary) hypertension: Secondary | ICD-10-CM | POA: Diagnosis not present

## 2012-06-22 DIAGNOSIS — I4891 Unspecified atrial fibrillation: Secondary | ICD-10-CM | POA: Diagnosis not present

## 2012-06-22 NOTE — Telephone Encounter (Signed)
The motrin is okay to take just not long term.  She only took one dose and this helped so much

## 2012-06-22 NOTE — Telephone Encounter (Signed)
Called patient back today to see if her pain was better in the back of her leg.  I have left her a message to call me if not any better

## 2012-06-22 NOTE — Telephone Encounter (Signed)
New problem:   Would like to discuss motrin.  Patient stated she is Doing better.

## 2012-06-23 ENCOUNTER — Ambulatory Visit (HOSPITAL_COMMUNITY): Payer: Medicare Other

## 2012-06-26 ENCOUNTER — Ambulatory Visit (HOSPITAL_COMMUNITY): Payer: Medicare Other

## 2012-06-28 ENCOUNTER — Ambulatory Visit (HOSPITAL_COMMUNITY): Payer: Medicare Other

## 2012-07-03 ENCOUNTER — Ambulatory Visit (HOSPITAL_COMMUNITY): Payer: Medicare Other

## 2012-07-03 DIAGNOSIS — I2510 Atherosclerotic heart disease of native coronary artery without angina pectoris: Secondary | ICD-10-CM | POA: Diagnosis not present

## 2012-07-03 DIAGNOSIS — I4892 Unspecified atrial flutter: Secondary | ICD-10-CM | POA: Diagnosis not present

## 2012-07-03 DIAGNOSIS — I10 Essential (primary) hypertension: Secondary | ICD-10-CM | POA: Diagnosis not present

## 2012-07-04 DIAGNOSIS — F411 Generalized anxiety disorder: Secondary | ICD-10-CM | POA: Diagnosis not present

## 2012-07-04 DIAGNOSIS — J069 Acute upper respiratory infection, unspecified: Secondary | ICD-10-CM | POA: Diagnosis not present

## 2012-07-05 ENCOUNTER — Ambulatory Visit (HOSPITAL_COMMUNITY): Payer: Medicare Other

## 2012-07-05 DIAGNOSIS — M19049 Primary osteoarthritis, unspecified hand: Secondary | ICD-10-CM | POA: Diagnosis not present

## 2012-07-05 DIAGNOSIS — M25549 Pain in joints of unspecified hand: Secondary | ICD-10-CM | POA: Diagnosis not present

## 2012-07-07 ENCOUNTER — Ambulatory Visit (HOSPITAL_COMMUNITY): Payer: Medicare Other

## 2012-07-10 ENCOUNTER — Ambulatory Visit (HOSPITAL_COMMUNITY): Payer: Medicare Other

## 2012-07-12 ENCOUNTER — Ambulatory Visit (HOSPITAL_COMMUNITY): Payer: Medicare Other

## 2012-07-14 ENCOUNTER — Ambulatory Visit (HOSPITAL_COMMUNITY): Payer: Medicare Other

## 2012-07-17 ENCOUNTER — Ambulatory Visit (HOSPITAL_COMMUNITY): Payer: Medicare Other

## 2012-07-19 ENCOUNTER — Encounter (HOSPITAL_COMMUNITY): Payer: Self-pay | Admitting: Emergency Medicine

## 2012-07-19 ENCOUNTER — Emergency Department (HOSPITAL_COMMUNITY)
Admission: EM | Admit: 2012-07-19 | Discharge: 2012-07-20 | Disposition: A | Discharge status: Home / Self Care | Payer: Medicare Other | Attending: Emergency Medicine | Admitting: Emergency Medicine

## 2012-07-19 ENCOUNTER — Ambulatory Visit (HOSPITAL_COMMUNITY): Payer: Medicare Other

## 2012-07-19 ENCOUNTER — Emergency Department (HOSPITAL_COMMUNITY): Payer: Medicare Other

## 2012-07-19 DIAGNOSIS — I252 Old myocardial infarction: Secondary | ICD-10-CM | POA: Insufficient documentation

## 2012-07-19 DIAGNOSIS — I10 Essential (primary) hypertension: Secondary | ICD-10-CM | POA: Diagnosis not present

## 2012-07-19 DIAGNOSIS — R0789 Other chest pain: Secondary | ICD-10-CM | POA: Diagnosis not present

## 2012-07-19 DIAGNOSIS — Z9861 Coronary angioplasty status: Secondary | ICD-10-CM | POA: Diagnosis not present

## 2012-07-19 DIAGNOSIS — Z87891 Personal history of nicotine dependence: Secondary | ICD-10-CM | POA: Insufficient documentation

## 2012-07-19 DIAGNOSIS — E039 Hypothyroidism, unspecified: Secondary | ICD-10-CM | POA: Insufficient documentation

## 2012-07-19 DIAGNOSIS — Z8709 Personal history of other diseases of the respiratory system: Secondary | ICD-10-CM | POA: Insufficient documentation

## 2012-07-19 DIAGNOSIS — E7800 Pure hypercholesterolemia, unspecified: Secondary | ICD-10-CM | POA: Diagnosis not present

## 2012-07-19 DIAGNOSIS — Z862 Personal history of diseases of the blood and blood-forming organs and certain disorders involving the immune mechanism: Secondary | ICD-10-CM | POA: Insufficient documentation

## 2012-07-19 DIAGNOSIS — Z7982 Long term (current) use of aspirin: Secondary | ICD-10-CM | POA: Diagnosis not present

## 2012-07-19 DIAGNOSIS — Z8739 Personal history of other diseases of the musculoskeletal system and connective tissue: Secondary | ICD-10-CM | POA: Insufficient documentation

## 2012-07-19 DIAGNOSIS — Z8719 Personal history of other diseases of the digestive system: Secondary | ICD-10-CM | POA: Insufficient documentation

## 2012-07-19 DIAGNOSIS — Z79899 Other long term (current) drug therapy: Secondary | ICD-10-CM | POA: Insufficient documentation

## 2012-07-19 DIAGNOSIS — Z8679 Personal history of other diseases of the circulatory system: Secondary | ICD-10-CM | POA: Diagnosis not present

## 2012-07-19 DIAGNOSIS — R079 Chest pain, unspecified: Secondary | ICD-10-CM | POA: Diagnosis not present

## 2012-07-19 DIAGNOSIS — K219 Gastro-esophageal reflux disease without esophagitis: Secondary | ICD-10-CM | POA: Diagnosis not present

## 2012-07-19 DIAGNOSIS — I2510 Atherosclerotic heart disease of native coronary artery without angina pectoris: Secondary | ICD-10-CM | POA: Insufficient documentation

## 2012-07-19 DIAGNOSIS — Z950 Presence of cardiac pacemaker: Secondary | ICD-10-CM | POA: Insufficient documentation

## 2012-07-19 DIAGNOSIS — E663 Overweight: Secondary | ICD-10-CM | POA: Diagnosis not present

## 2012-07-19 LAB — BASIC METABOLIC PANEL
BUN: 13 mg/dL (ref 6–23)
Chloride: 102 mEq/L (ref 96–112)
Creatinine, Ser: 1.11 mg/dL — ABNORMAL HIGH (ref 0.50–1.10)
GFR calc Af Amer: 59 mL/min — ABNORMAL LOW (ref >90)
GFR calc non Af Amer: 51 mL/min — ABNORMAL LOW (ref >90)
Glucose, Bld: 103 mg/dL — ABNORMAL HIGH (ref 70–99)

## 2012-07-19 LAB — CBC WITH DIFFERENTIAL/PLATELET
Basophils Relative: 2 % — ABNORMAL HIGH (ref 0–1)
Eosinophils Absolute: 0.1 10*3/uL (ref 0.0–0.7)
HCT: 37.7 % (ref 36.0–46.0)
Hemoglobin: 12.6 g/dL (ref 12.0–15.0)
Lymphs Abs: 2.4 10*3/uL (ref 0.7–4.0)
MCH: 27.6 pg (ref 26.0–34.0)
MCHC: 33.4 g/dL (ref 30.0–36.0)
Monocytes Absolute: 0.6 10*3/uL (ref 0.1–1.0)
Monocytes Relative: 9 % (ref 3–12)

## 2012-07-19 MED ORDER — DRONEDARONE HCL 400 MG PO TABS
400.0000 mg | ORAL_TABLET | Freq: Once | ORAL | Status: AC
  Administered 2012-07-19: 400 mg via ORAL
  Filled 2012-07-19: qty 1

## 2012-07-19 NOTE — ED Provider Notes (Signed)
  66 year old female who had a non-STEMI about 3 months ago and angioplasty of the affected vessel without stenting had an episode of chest heaviness and pressure or driving. Pressure did resolve by the time she arrived in the ED. There was no associated dyspnea, nausea, diaphoresis. However, the character of the pain was similar to what she had with her non-STEMI. She is also had ablation for atrial fibrillation. On exam, lungs are clear, heart has regular rate and rhythm, abdomen is soft and nontender, there is no peripheral edema. 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 She is currently pain free, she will need to be observed in the ED to make sure that troponins are -6 hours after resolution of pain.   ECG shows normal sinus rhythm with a rate of 61, no ectopy. Normal axis. Normal P wave. Normal QRS. Normal intervals. Normal ST and T waves. Impression: normal ECG. No prior ECG available for comparison.   I saw and evaluated the patient, reviewed the resident's note and I agree with the findings and plan.   Dione Booze, MD 07/19/12 Corky Crafts

## 2012-07-19 NOTE — ED Notes (Addendum)
Alert, interactive, calm, skin W&D, resps e/u, speaking in clear complete sentences. Up to b/r, steady gait. Family at [facility] Denies sx.

## 2012-07-19 NOTE — ED Notes (Signed)
Pt returned from xray

## 2012-07-19 NOTE — ED Notes (Signed)
Pt concerned about missing dose of Multaq. PA made aware. Orders to follow.

## 2012-07-19 NOTE — ED Provider Notes (Signed)
History     CSN: [id number]  Arrival date & time 07/19/12  1638  First MD Initiated Contact with Patient 07/19/12 1709     Chief Complaint  Patient presents with  . Chest Pain   HPI: Cheryl [name] is a 66 yo CF with history of HTN, HLD, GERD, paroxysmal atrial fibrillation s/p ablation 6 weeks ago, CAD s/p MI 05/2012, presents with chest pressure. Symptoms started at 2:30 pm while she was driving. Substernal discomfort, described as pressure, non-radiating, moderate intensity, no alleviating or exacerbating factors. Symptoms were persistent for a few hours, she states this felt similar to previous MI so she called EMS. She denies SOB, diaphoresis or nausea. No recent illness, cough, fever, leg swelling or dizziness.   Past Medical History  Diagnosis Date  . HTN (hypertension)   . Hypercholesteremia   . GERD (gastroesophageal reflux disease)   . Hypothyroidism   . Anemia   . Blood transfusion ~ 1962    " couple; no reaction " (05/30/2012)  . H/O hiatal hernia   . Arthritis   . Coronary artery disease     pci OF om  . Pacemaker 10/2011  . PAF (paroxysmal atrial fibrillation)   . Tachy-brady syndrome     a. post-termination pauses in setting of PAF;  b. 10/28/11 - St. Jude Dual Chamber PPM SN [id number]   . Myocardial infarction 04/2012    "very very light" (05/30/2012)  . Shortness of breath     "related to RX and atrial fib" (05/30/2012)    Past Surgical History  Procedure Date  . Tee without cardioversion 05/29/2012    Procedure: TRANSESOPHAGEAL ECHOCARDIOGRAM (TEE);  Surgeon: Quintella Reichert, MD;  Location: [facility] ENDOSCOPY;  Service: Cardiovascular;  Laterality: N/A;  h/p in file drawer/dl  . Atrial fibrillation ablation 05/30/2012  . Breast surgery 1990's    "right radial scar" (05/30/2012)  . Insert / replace / remove pacemaker 10/28/2011    initial placement  . Coronary angioplasty ~ 04/2012    Family History  Problem Relation Age of Onset  . Heart attack Father 72  . Heart  attack Mother 49  . Cancer Mother     Died age 54 with pancreatic cancer, also had uterine and breast cancer    History  Substance Use Topics  . Smoking status: Former Smoker -- 0.1 packs/day for 15 years    Types: Cigarettes    Quit date: 06/17/1977  . Smokeless tobacco: Never Used     Comment: QUIT smoking cigarettes 1978  . Alcohol Use: No    OB History    Grav Para Term Preterm Abortions TAB SAB Ect Mult Living                 Review of Systems  Constitutional: Negative for fever, appetite change and fatigue.  HENT: Negative for congestion, sore throat, rhinorrhea and neck pain.   Eyes: Negative for photophobia and visual disturbance.  Respiratory: Negative for cough, shortness of breath and wheezing.   Cardiovascular: Positive for chest pain. Negative for palpitations.  Gastrointestinal: Negative for vomiting, abdominal pain, diarrhea and constipation.  Genitourinary: Negative for dysuria, urgency and decreased urine volume.  Musculoskeletal: Negative for myalgias, back pain and arthralgias.  Skin: Negative for pallor and rash.  Neurological: Negative for dizziness, syncope and headaches.  Psychiatric/Behavioral: Negative for confusion and agitation.  All other systems reviewed and are negative.   Allergies  Latex; Protonix; Codeine; and Tape  Home Medications   Current Outpatient Rx  Name  Route  Sig  Dispense  Refill  . ACETAMINOPHEN 325 MG PO TABS   Oral   Take 650 mg by mouth every 6 (six) hours as needed. For pain in back.         . AMOXICILLIN-POT CLAVULANATE 500-125 MG PO TABS   Oral   Take 1 tablet by mouth 2 (two) times daily.         . ASPIRIN 81 MG PO CHEW   Oral   Chew 81 mg by mouth daily.         . CRESTOR 10 MG PO TABS   Oral   Take 10 mg by mouth Daily.         Marland Kitchen DRONEDARONE HCL 400 MG PO TABS   Oral   Take 1 tablet (400 mg total) by mouth 2 (two) times daily with a meal. As previously directed by Dr. Johney Frame.   60 tablet       . FAMOTIDINE 20 MG PO TABS   Oral   Take 1 tablet (20 mg total) by mouth daily.   30 tablet   12   . LEVOTHYROXINE SODIUM 100 MCG PO TABS   Oral   Take 100 mcg by mouth Daily.         Marland Kitchen METOPROLOL SUCCINATE ER 50 MG PO TB24   Oral   Take 50 mg by mouth daily. Take with or immediately following a meal.         . XARELTO 20 MG PO TABS   Oral   Take 20 mg by mouth daily.          Marland Kitchen NITROGLYCERIN 0.4 MG SL SUBL   Sublingual   Place 1 tablet (0.4 mg total) under the tongue every 5 (five) minutes x 3 doses as needed for chest pain.   25 tablet   5    BP 129/66  Pulse 61  Temp 98.3 F (36.8 C) (Oral)  Resp 16  SpO2 98%  Physical Exam  Nursing note and vitals reviewed. Constitutional: She is oriented to person, place, and time. She is cooperative. No distress.       Pleasant, overweight female, no acute distress   HENT:  Head: Normocephalic and atraumatic.  Mouth/Throat: Mucous membranes are normal.  Eyes: Conjunctivae normal and EOM are normal. Pupils are equal, round, and reactive to light.  Neck: Trachea normal and normal range of motion. No JVD present.  Cardiovascular: Normal rate, regular rhythm, S1 normal, S2 normal and normal heart sounds.  Exam reveals no decreased pulses.   No murmur heard. Pulmonary/Chest: Effort normal and breath sounds normal. No respiratory distress. She has no decreased breath sounds. She has no rales.  Abdominal: Soft. Normal appearance and bowel sounds are normal. There is no tenderness.  Musculoskeletal: Normal range of motion. She exhibits no edema.  Neurological: She is alert and oriented to person, place, and time. She has normal reflexes.  Skin: Skin is warm and dry.   ED Course  Procedures   Labs Reviewed  CBC WITH DIFFERENTIAL - Abnormal; Notable for the following:    Basophils Relative 2 (*)     All other components within normal limits  BASIC METABOLIC PANEL - Abnormal; Notable for the following:    Glucose, Bld 103  (*)     Creatinine, Ser 1.11 (*)     GFR calc non Af Amer 51 (*)     GFR calc Af Amer 59 (*)     All other  components within normal limits  TROPONIN I  TROPONIN I  TROPONIN I   Dg Chest 2 View  07/19/2012  *RADIOLOGY REPORT*  Clinical Data: Chest pain, epigastric pain, history of atrial fibrillation  CHEST - 2 VIEW  Comparison: Portable chest x-ray of 04/24/2012  Findings:  No active infiltrate or effusion is seen.  There is mild cardiomegaly present which is stable.  A dual lead permanent pacemaker remains.  No acute bony abnormality is seen.  IMPRESSION: Stable chest x-ray with mild cardiomegaly and pacemaker.  No active lung disease.   Original Report Authenticated By: Dwyane Dee, M.D.    1. Atypical chest pain    MDM  66 yo CF with history of HTN, HLD, GERD, paroxysmal atrial fibrillation s/p ablation 6 weeks ago, CAD s/p MI 05/2012, presents with chest pressure. Afebrile, vital signs stable. Recent cath revealed 90% obtuse marginal obstruction, not amendable to stent. Initial ECG: atrial pacemaker, no new changes to suggest ischemia. Pain free on arrival. ASA given by EMS. Doubt PE as low risk by Well's, not tachycardic or hypoxic. No evidence of PNA on CXR. Doubt aortic dissection, as no widened mediastinum, normal pulses bilaterally, chest pain resolved. She was placed in CDU and evaluated with serial troponin's. She remained CP free, all three troponin's were negative. Doubt ACS as no recurrence of CP, no acute changes on ECG, 3 X negative troponin. She was DC home with strict return precautions. Advised to f/u with her cardiologist this week.   ECG: atrial pacemaker, underlying SR, rate 61, flattened T waves in leads III and aVL. Inverted T wave in lead V1. No significant change from previous ECG dated 04/2012.   Reviewed imaging, labs, ECG and previous medical records, utilized in MDM  Discussed case with Dr. Preston Fleeting  Clinical Impression 1. Atypical chest pain.        Margie Billet, MD 07/20/12 [phone]

## 2012-07-19 NOTE — ED Provider Notes (Signed)
This is a 66 year old female, who originally presented to the emergency department with chief complaint of chest pain. She was seen in pod b. by Dr. Preston Fleeting and the resident [place]. The patient is transferred to the CDU for repeat troponins. She may be discharged with cardiology followup if negative.  Patient currently denies any symptoms.   Patient denies headache, blurred vision, new hearing loss, sore throat, chest pain, shortness of breath, nausea, vomiting, diarrhea, constipation, dysuria, peripheral edema, back pain, numbness or tingling of the extremities.  PE: Gen: A&O x4 HEENT: PERRL, EOM intact CHEST: RRR, no m/r/g LUNGS: CTAB, no w/r/r ABD: BS x 4, ND/NT EXT: No edema, strong peripheral pulses NEURO: Sensation and strength intact bilaterally  11:34 PM Patient care to be resumed by Dr. Nicanor Alcon. Patient history has been discussed with physician resuming care. Patient is in the CDU for observation and has repeat troponin tests pending, ~12:40am. Disposition will be determined once results obtained. Patient is currently asymptomatic, in NAD, VSS, and has no current complaints.   Roxy Horseman, PA-C 07/19/12 [phone]

## 2012-07-19 NOTE — ED Notes (Addendum)
Per EMS pt was driving at 4010 and had sudden onset chest tightness in epigastric area 4/10. No SOB no nausea. C/o dry mouth. Pt had similar symptoms in sept and was told at that time that she had "mild MI". Pt thinks it could be panic attacks. Pt went home and felt no relief of pain so she called 911. EMS gave 324 ASA. Pt felt relief with ASA. Pt denies pain at this time. Pt has hx of afib and pacemaker. Pt had ablation 6 weeks ago. BP- 162/64  HR-60 RR-18  99% 2L [place]

## 2012-07-20 NOTE — ED Notes (Signed)
Blood drawn, pending lab results, no changes, denies sx.

## 2012-07-21 ENCOUNTER — Ambulatory Visit (HOSPITAL_COMMUNITY): Payer: Medicare Other

## 2012-07-21 DIAGNOSIS — I10 Essential (primary) hypertension: Secondary | ICD-10-CM | POA: Diagnosis not present

## 2012-07-21 DIAGNOSIS — I2510 Atherosclerotic heart disease of native coronary artery without angina pectoris: Secondary | ICD-10-CM | POA: Diagnosis not present

## 2012-07-21 DIAGNOSIS — I4891 Unspecified atrial fibrillation: Secondary | ICD-10-CM | POA: Diagnosis not present

## 2012-07-21 DIAGNOSIS — E785 Hyperlipidemia, unspecified: Secondary | ICD-10-CM | POA: Diagnosis not present

## 2012-07-21 DIAGNOSIS — Z79899 Other long term (current) drug therapy: Secondary | ICD-10-CM | POA: Diagnosis not present

## 2012-07-22 NOTE — ED Provider Notes (Signed)
Medical screening examination/treatment/procedure(s) were conducted as a shared visit with non-physician practitioner(s) and myself.  I personally evaluated the patient during the encounter   Dione Booze, MD 07/22/12 [phone]

## 2012-07-24 ENCOUNTER — Ambulatory Visit (HOSPITAL_COMMUNITY): Payer: Medicare Other

## 2012-07-24 ENCOUNTER — Telehealth: Payer: Self-pay | Admitting: Internal Medicine

## 2012-07-24 NOTE — Telephone Encounter (Signed)
New Problem:     Patient called in wanting to ask you about if she would be ok to have a stress test on 07/27/12 after her ablation.  Please call back.

## 2012-07-24 NOTE — Telephone Encounter (Signed)
Spoke with patient and let her know it is okay to have the stress test done on 07/27/12  She is going to do so

## 2012-07-25 ENCOUNTER — Telehealth: Payer: Self-pay | Admitting: Internal Medicine

## 2012-07-25 DIAGNOSIS — R319 Hematuria, unspecified: Secondary | ICD-10-CM | POA: Diagnosis not present

## 2012-07-25 DIAGNOSIS — R300 Dysuria: Secondary | ICD-10-CM | POA: Diagnosis not present

## 2012-07-25 NOTE — Telephone Encounter (Signed)
plz return call to pt [phone] regarding questions about xarelto medication

## 2012-07-25 NOTE — Telephone Encounter (Signed)
Patient phoned stating yesterday one time only her urine appeared darker than normal ("brown looking").  Patient states no dark stools or any visible signs of bleeding.  Denies any urinary problems and again stated urine looks normal now.  Patient does not think she had as make fluid intake as normal yesterday. Advised to continue to monitor and if any other issues to call back. Did state she was going to call PCP to try to get her urine checked. Will forward to Dr Johney Frame and  Tresa Endo L so they will be aware

## 2012-07-28 ENCOUNTER — Ambulatory Visit (HOSPITAL_COMMUNITY): Payer: Medicare Other

## 2012-07-31 ENCOUNTER — Ambulatory Visit (HOSPITAL_COMMUNITY): Payer: Medicare Other

## 2012-08-02 ENCOUNTER — Ambulatory Visit (HOSPITAL_COMMUNITY): Payer: Medicare Other

## 2012-08-03 DIAGNOSIS — I4891 Unspecified atrial fibrillation: Secondary | ICD-10-CM | POA: Diagnosis not present

## 2012-08-03 DIAGNOSIS — I2510 Atherosclerotic heart disease of native coronary artery without angina pectoris: Secondary | ICD-10-CM | POA: Diagnosis not present

## 2012-08-03 DIAGNOSIS — Z79899 Other long term (current) drug therapy: Secondary | ICD-10-CM | POA: Diagnosis not present

## 2012-08-03 DIAGNOSIS — I10 Essential (primary) hypertension: Secondary | ICD-10-CM | POA: Diagnosis not present

## 2012-08-04 ENCOUNTER — Ambulatory Visit (HOSPITAL_COMMUNITY): Payer: Medicare Other

## 2012-08-08 ENCOUNTER — Telehealth: Payer: Self-pay | Admitting: Internal Medicine

## 2012-08-08 DIAGNOSIS — Z950 Presence of cardiac pacemaker: Secondary | ICD-10-CM | POA: Diagnosis not present

## 2012-08-08 DIAGNOSIS — I10 Essential (primary) hypertension: Secondary | ICD-10-CM | POA: Diagnosis not present

## 2012-08-08 DIAGNOSIS — I4891 Unspecified atrial fibrillation: Secondary | ICD-10-CM | POA: Diagnosis not present

## 2012-08-08 DIAGNOSIS — I2510 Atherosclerotic heart disease of native coronary artery without angina pectoris: Secondary | ICD-10-CM | POA: Diagnosis not present

## 2012-08-08 NOTE — Telephone Encounter (Signed)
Spoke to patient she stated she had large amount of bright red blood in urine this morning.States she urinated again and continues to be bright red blood in urine.States has appointment with urologist 08/30/12.Patient wanting to know if xarelto is causing.Patient was told Dr.Allred not in office today,will send message to [facility] for advice.

## 2012-08-08 NOTE — Telephone Encounter (Signed)
Pt having blood in urine  for the third time this pm, no other symptoms , had ua done at pcp recently 07-25-12 no infection found, only blood, was referred to urologist appt 08-30-12, wondered if due to med, xarelto?

## 2012-08-10 DIAGNOSIS — R319 Hematuria, unspecified: Secondary | ICD-10-CM | POA: Diagnosis not present

## 2012-08-11 DIAGNOSIS — R310 Gross hematuria: Secondary | ICD-10-CM | POA: Diagnosis not present

## 2012-08-12 ENCOUNTER — Emergency Department (HOSPITAL_COMMUNITY)
Admission: EM | Admit: 2012-08-12 | Discharge: 2012-08-13 | Disposition: A | Discharge status: Home / Self Care | Payer: Medicare Other | Attending: Emergency Medicine | Admitting: Emergency Medicine

## 2012-08-12 ENCOUNTER — Encounter (HOSPITAL_COMMUNITY): Payer: Self-pay

## 2012-08-12 DIAGNOSIS — R6889 Other general symptoms and signs: Secondary | ICD-10-CM | POA: Diagnosis not present

## 2012-08-12 DIAGNOSIS — IMO0002 Reserved for concepts with insufficient information to code with codable children: Secondary | ICD-10-CM | POA: Insufficient documentation

## 2012-08-12 DIAGNOSIS — I10 Essential (primary) hypertension: Secondary | ICD-10-CM | POA: Insufficient documentation

## 2012-08-12 DIAGNOSIS — R0602 Shortness of breath: Secondary | ICD-10-CM | POA: Diagnosis not present

## 2012-08-12 DIAGNOSIS — R55 Syncope and collapse: Secondary | ICD-10-CM | POA: Insufficient documentation

## 2012-08-12 DIAGNOSIS — Z9889 Other specified postprocedural states: Secondary | ICD-10-CM | POA: Diagnosis not present

## 2012-08-12 DIAGNOSIS — Z7982 Long term (current) use of aspirin: Secondary | ICD-10-CM | POA: Insufficient documentation

## 2012-08-12 DIAGNOSIS — Z8719 Personal history of other diseases of the digestive system: Secondary | ICD-10-CM | POA: Diagnosis not present

## 2012-08-12 DIAGNOSIS — Z9861 Coronary angioplasty status: Secondary | ICD-10-CM | POA: Insufficient documentation

## 2012-08-12 DIAGNOSIS — E7800 Pure hypercholesterolemia, unspecified: Secondary | ICD-10-CM | POA: Diagnosis not present

## 2012-08-12 DIAGNOSIS — I2510 Atherosclerotic heart disease of native coronary artery without angina pectoris: Secondary | ICD-10-CM | POA: Insufficient documentation

## 2012-08-12 DIAGNOSIS — Z862 Personal history of diseases of the blood and blood-forming organs and certain disorders involving the immune mechanism: Secondary | ICD-10-CM | POA: Diagnosis not present

## 2012-08-12 DIAGNOSIS — I252 Old myocardial infarction: Secondary | ICD-10-CM | POA: Insufficient documentation

## 2012-08-12 DIAGNOSIS — I4891 Unspecified atrial fibrillation: Secondary | ICD-10-CM | POA: Diagnosis not present

## 2012-08-12 DIAGNOSIS — M129 Arthropathy, unspecified: Secondary | ICD-10-CM | POA: Diagnosis not present

## 2012-08-12 DIAGNOSIS — E039 Hypothyroidism, unspecified: Secondary | ICD-10-CM | POA: Insufficient documentation

## 2012-08-12 DIAGNOSIS — Z950 Presence of cardiac pacemaker: Secondary | ICD-10-CM | POA: Diagnosis not present

## 2012-08-12 DIAGNOSIS — Z87891 Personal history of nicotine dependence: Secondary | ICD-10-CM | POA: Insufficient documentation

## 2012-08-12 DIAGNOSIS — F411 Generalized anxiety disorder: Secondary | ICD-10-CM | POA: Diagnosis not present

## 2012-08-12 LAB — COMPREHENSIVE METABOLIC PANEL
AST: 13 U/L (ref 0–37)
BUN: 14 mg/dL (ref 6–23)
CO2: 27 mEq/L (ref 19–32)
Calcium: 9.4 mg/dL (ref 8.4–10.5)
Chloride: 101 mEq/L (ref 96–112)
Creatinine, Ser: 1.11 mg/dL — ABNORMAL HIGH (ref 0.50–1.10)
GFR calc Af Amer: 59 mL/min — ABNORMAL LOW (ref >90)
GFR calc non Af Amer: 51 mL/min — ABNORMAL LOW (ref >90)
Glucose, Bld: 95 mg/dL (ref 70–99)
Total Bilirubin: 0.2 mg/dL — ABNORMAL LOW (ref 0.3–1.2)

## 2012-08-12 LAB — CBC WITH DIFFERENTIAL/PLATELET
Basophils Absolute: 0.2 10*3/uL — ABNORMAL HIGH (ref 0.0–0.1)
Eosinophils Relative: 2 % (ref 0–5)
HCT: 37.5 % (ref 36.0–46.0)
Hemoglobin: 12.3 g/dL (ref 12.0–15.0)
Lymphocytes Relative: 27 % (ref 12–46)
MCV: 82.4 fL (ref 78.0–100.0)
Monocytes Absolute: 0.8 10*3/uL (ref 0.1–1.0)
Monocytes Relative: 10 % (ref 3–12)
Neutro Abs: 4.6 10*3/uL (ref 1.7–7.7)
RDW: 13.6 % (ref 11.5–15.5)
WBC: 8 10*3/uL (ref 4.0–10.5)

## 2012-08-12 LAB — POCT I-STAT TROPONIN I: Troponin i, poc: 0 ng/mL (ref 0.00–0.08)

## 2012-08-12 NOTE — ED Provider Notes (Signed)
History     CSN: [id number]  Arrival date & time 08/12/12  1652   First MD Initiated Contact with Patient 08/12/12 1746      Chief Complaint  Patient presents with  . Near Syncope    (Consider location/radiation/quality/duration/timing/severity/associated sxs/prior treatment) HPI  67 year old female half an hour long spell this afternoon it started at 3:00 of generalized warmth and generalized shakiness without chills, weakness, chest pain, palpitations, shortness of breath, headache, back pain, abdominal pain, nausea or vomiting, sweats, or change in speech vision swallowing or understanding, she is no lateralizing or focal weakness or numbness or incoordination. She feels fine now. There is no treatment prior to arrival. She recently was seen in the ED for atypical chest pain and rule out for myocardial infarction, saw her cardiologist in followup, she is a cardiac stress test scheduled in 3 days from now. Past Medical History  Diagnosis Date  . HTN (hypertension)   . Hypercholesteremia   . GERD (gastroesophageal reflux disease)   . Hypothyroidism   . Anemia   . Blood transfusion ~ 1962    " couple; no reaction " (05/30/2012)  . H/O hiatal hernia   . Arthritis   . Coronary artery disease     pci OF om  . Pacemaker 10/2011  . PAF (paroxysmal atrial fibrillation)   . Tachy-brady syndrome     a. post-termination pauses in setting of PAF;  b. 10/28/11 - St. Jude Dual Chamber PPM SN [id number]   . Myocardial infarction 04/2012    "very very light" (05/30/2012)  . Shortness of breath     "related to RX and atrial fib" (05/30/2012)    Past Surgical History  Procedure Date  . Tee without cardioversion 05/29/2012    Procedure: TRANSESOPHAGEAL ECHOCARDIOGRAM (TEE);  Surgeon: Quintella Reichert, MD;  Location: [facility] ENDOSCOPY;  Service: Cardiovascular;  Laterality: N/A;  h/p in file drawer/dl  . Atrial fibrillation ablation 05/30/2012  . Breast surgery 1990's    "right radial scar"  (05/30/2012)  . Insert / replace / remove pacemaker 10/28/2011    initial placement  . Coronary angioplasty ~ 04/2012    Family History  Problem Relation Age of Onset  . Heart attack Father 21  . Heart attack Mother 69  . Cancer Mother     Died age 56 with pancreatic cancer, also had uterine and breast cancer    History  Substance Use Topics  . Smoking status: Former Smoker -- 0.1 packs/day for 15 years    Types: Cigarettes    Quit date: 06/17/1977  . Smokeless tobacco: Never Used     Comment: QUIT smoking cigarettes 1978  . Alcohol Use: No    OB History    Grav Para Term Preterm Abortions TAB SAB Ect Mult Living                  Review of Systems 10 Systems reviewed and are negative for acute change except as noted in the HPI. Allergies  Latex; Protonix; Codeine; and Tape  Home Medications   Current Outpatient Rx  Name  Route  Sig  Dispense  Refill  . ASPIRIN EC 81 MG PO TBEC   Oral   Take 81 mg by mouth daily.         . CRESTOR 10 MG PO TABS   Oral   Take 10 mg by mouth Daily.         Marland Kitchen DRONEDARONE HCL 400 MG PO TABS   Oral  Take 1 tablet (400 mg total) by mouth 2 (two) times daily with a meal. As previously directed by Dr. Johney Frame.   60 tablet      . FAMOTIDINE 20 MG PO TABS   Oral   Take 1 tablet (20 mg total) by mouth daily.   30 tablet   12   . FLUTICASONE PROPIONATE 50 MCG/ACT NA SUSP   Nasal   Place 2 sprays into the nose daily as needed. For dry nasal passages         . LEVOTHYROXINE SODIUM 100 MCG PO TABS   Oral   Take 100 mcg by mouth Daily.         Marland Kitchen LORATADINE 10 MG PO TABS   Oral   Take 10 mg by mouth daily as needed. For seasonal allergies         . METOPROLOL SUCCINATE ER 50 MG PO TB24   Oral   Take 75 mg by mouth daily. Take with or immediately following a meal.         . NITROGLYCERIN 0.4 MG SL SUBL   Sublingual   Place 1 tablet (0.4 mg total) under the tongue every 5 (five) minutes x 3 doses as needed for chest  pain.   25 tablet   5   . XARELTO 20 MG PO TABS   Oral   Take 20 mg by mouth daily.            BP 131/59  Pulse 62  Temp 98.4 F (36.9 C) (Oral)  Resp 15  SpO2 97%  Physical Exam  Nursing note and vitals reviewed. Constitutional:       Awake, alert, nontoxic appearance with baseline speech for patient.  HENT:  Head: Atraumatic.  Mouth/Throat: No oropharyngeal exudate.  Eyes: EOM are normal. Pupils are equal, round, and reactive to light. Right eye exhibits no discharge. Left eye exhibits no discharge.  Neck: Neck supple.  Cardiovascular: Normal rate and regular rhythm.   No murmur heard. Pulmonary/Chest: Effort normal and breath sounds normal. No stridor. No respiratory distress. She has no wheezes. She has no rales. She exhibits no tenderness.  Abdominal: Soft. Bowel sounds are normal. She exhibits no mass. There is no tenderness. There is no rebound.  Musculoskeletal: She exhibits no tenderness.       Baseline ROM, moves extremities with no obvious new focal weakness.  Lymphadenopathy:    She has no cervical adenopathy.  Neurological: She is alert.       Awake, alert, cooperative and aware of situation; motor strength bilaterally; sensation normal to light touch bilaterally; peripheral visual fields full to confrontation; no facial asymmetry; tongue midline; major cranial nerves appear intact; no pronator drift, normal finger to nose bilaterally  Skin: No rash noted.  Psychiatric: She has a normal mood and affect.    ED Course  Procedures (including critical care time) ECG: Atrial rhythm, ventricular rate 64, normal axis, nonspecific ST changes, no significant change noted compared to December 2013  Labs Reviewed  CBC WITH DIFFERENTIAL - Abnormal; Notable for the following:    Basophils Relative 3 (*)     Basophils Absolute 0.2 (*)     All other components within normal limits  COMPREHENSIVE METABOLIC PANEL - Abnormal; Notable for the following:    Creatinine, Ser  1.11 (*)     Alkaline Phosphatase 26 (*)     Total Bilirubin 0.2 (*)     GFR calc non Af Amer 51 (*)  GFR calc Af Amer 59 (*)     All other components within normal limits  POCT I-STAT TROPONIN I  POCT I-STAT TROPONIN I  LAB REPORT - SCANNED   No results found.   1. Near syncope       MDM  Patient / Family / Caregiver informed of clinical course, understand medical decision-making process, and agree with plan.  I doubt any other EMC precluding discharge at this time including, but not necessarily limited to the following:ACS.         Hurman Horn, MD 08/13/12 [phone]

## 2012-08-12 NOTE — ED Notes (Signed)
Pt reports 2 episodes of feeling "funny all over," pt sts "I don't know how to describe it."  Pt seen here 2 weeks ago w/similar symptoms, pt reports she began having tremors and symptoms then subsided, EMS came to pt's house did a EKG sent pt w/ekg reading which were unremarkable, pt denies chest pain, N/V, or sob, a/o x4, no neuro deficits noted

## 2012-08-12 NOTE — ED Notes (Signed)
Patient states she was sitting on couch and just felt "an overwhelming feeling".   Patient claims she had the same feeling before when she had a MI.  Patient denies chest pain and denies SOB.

## 2012-08-14 DIAGNOSIS — N139 Obstructive and reflux uropathy, unspecified: Secondary | ICD-10-CM | POA: Diagnosis not present

## 2012-08-14 DIAGNOSIS — R310 Gross hematuria: Secondary | ICD-10-CM | POA: Diagnosis not present

## 2012-08-14 NOTE — Telephone Encounter (Signed)
She feels like this probably has a lot due to her nerves.  Dr Mayford Knife took her off the Xarelto and she was panicking due to coming off the medication but is going to try and stop it for a few says to see if the bleeding stops.  She has seen a urologist and is following with them for this as well  She will let me know if there are more comcerns

## 2012-08-14 NOTE — Telephone Encounter (Signed)
Please follow-up with patient about her prior hematuria and recent ER visit for presyncope

## 2012-08-15 DIAGNOSIS — I10 Essential (primary) hypertension: Secondary | ICD-10-CM | POA: Diagnosis not present

## 2012-08-15 DIAGNOSIS — Z79899 Other long term (current) drug therapy: Secondary | ICD-10-CM | POA: Diagnosis not present

## 2012-08-15 DIAGNOSIS — E785 Hyperlipidemia, unspecified: Secondary | ICD-10-CM | POA: Diagnosis not present

## 2012-08-15 DIAGNOSIS — I2510 Atherosclerotic heart disease of native coronary artery without angina pectoris: Secondary | ICD-10-CM | POA: Diagnosis not present

## 2012-08-15 DIAGNOSIS — Z950 Presence of cardiac pacemaker: Secondary | ICD-10-CM | POA: Diagnosis not present

## 2012-08-15 DIAGNOSIS — I4891 Unspecified atrial fibrillation: Secondary | ICD-10-CM | POA: Diagnosis not present

## 2012-08-16 DIAGNOSIS — I10 Essential (primary) hypertension: Secondary | ICD-10-CM | POA: Diagnosis not present

## 2012-08-16 DIAGNOSIS — R259 Unspecified abnormal involuntary movements: Secondary | ICD-10-CM | POA: Diagnosis not present

## 2012-08-17 ENCOUNTER — Other Ambulatory Visit: Payer: Self-pay | Admitting: Urology

## 2012-08-18 ENCOUNTER — Encounter (HOSPITAL_COMMUNITY)
Admission: RE | Admit: 2012-08-18 | Discharge: 2012-08-18 | Disposition: A | Discharge status: Home / Self Care | Payer: Medicare Other | Source: Ambulatory Visit | Attending: Urology | Admitting: Urology

## 2012-08-18 ENCOUNTER — Encounter (HOSPITAL_COMMUNITY): Payer: Self-pay

## 2012-08-18 ENCOUNTER — Encounter (HOSPITAL_COMMUNITY): Payer: Self-pay | Admitting: Pharmacy Technician

## 2012-08-18 DIAGNOSIS — K219 Gastro-esophageal reflux disease without esophagitis: Secondary | ICD-10-CM | POA: Diagnosis not present

## 2012-08-18 DIAGNOSIS — E7800 Pure hypercholesterolemia, unspecified: Secondary | ICD-10-CM | POA: Diagnosis not present

## 2012-08-18 DIAGNOSIS — I252 Old myocardial infarction: Secondary | ICD-10-CM | POA: Diagnosis not present

## 2012-08-18 DIAGNOSIS — Z01812 Encounter for preprocedural laboratory examination: Secondary | ICD-10-CM | POA: Diagnosis not present

## 2012-08-18 DIAGNOSIS — R310 Gross hematuria: Secondary | ICD-10-CM | POA: Diagnosis not present

## 2012-08-18 DIAGNOSIS — Z79899 Other long term (current) drug therapy: Secondary | ICD-10-CM | POA: Diagnosis not present

## 2012-08-18 DIAGNOSIS — E039 Hypothyroidism, unspecified: Secondary | ICD-10-CM | POA: Diagnosis not present

## 2012-08-18 DIAGNOSIS — I10 Essential (primary) hypertension: Secondary | ICD-10-CM | POA: Diagnosis not present

## 2012-08-18 DIAGNOSIS — Z7982 Long term (current) use of aspirin: Secondary | ICD-10-CM | POA: Diagnosis not present

## 2012-08-18 DIAGNOSIS — N1330 Unspecified hydronephrosis: Secondary | ICD-10-CM | POA: Diagnosis not present

## 2012-08-18 HISTORY — DX: Depression, unspecified: F32.A

## 2012-08-18 HISTORY — DX: Major depressive disorder, single episode, unspecified: F32.9

## 2012-08-18 HISTORY — DX: Crossing vessel and stricture of ureter without hydronephrosis: N13.5

## 2012-08-18 LAB — BASIC METABOLIC PANEL
BUN: 13 mg/dL (ref 6–23)
CO2: 29 mEq/L (ref 19–32)
Calcium: 9.9 mg/dL (ref 8.4–10.5)
Glucose, Bld: 101 mg/dL — ABNORMAL HIGH (ref 70–99)
Sodium: 140 mEq/L (ref 135–145)

## 2012-08-18 LAB — PROTIME-INR
INR: 0.92 (ref 0.00–1.49)
Prothrombin Time: 12.3 seconds (ref 11.6–15.2)

## 2012-08-18 LAB — CBC
HCT: 41.7 % (ref 36.0–46.0)
Hemoglobin: 14 g/dL (ref 12.0–15.0)
MCH: 27.5 pg (ref 26.0–34.0)
RBC: 5.09 MIL/uL (ref 3.87–5.11)

## 2012-08-18 LAB — SURGICAL PCR SCREEN: MRSA, PCR: INVALID — AB

## 2012-08-18 NOTE — Patient Instructions (Signed)
Cheryl [name]  08/18/2012                           YOUR PROCEDURE IS SCHEDULED ON: 08/21/12               PLEASE REPORT TO SHORT STAY CENTER AT : 5:15 AM               CALL THIS NUMBER IF ANY PROBLEMS THE DAY OF SURGERY :               [phone]                      REMEMBER:   Do not eat food or drink liquids AFTER MIDNIGHT    Take these medicines the morning of surgery with A SIP OF WATER: DRONEDARONE / PEPCID / LEVTHYROXINE / METOPROLOL   Do not wear jewelry, make-up   Do not wear lotions, powders, or perfumes.   Do not shave legs or underarms 12 hrs. before surgery (men may shave face)  Do not bring valuables to the hospital.  Contacts, dentures or bridgework may not be worn into surgery.  Leave suitcase in the car. After surgery it may be brought to your room.  For patients admitted to the hospital more than one night, checkout time is 11:00                          The day of discharge.   Patients discharged the day of surgery will not be allowed to drive home                             If going home same day of surgery, must have someone stay with you first                           24 hrs at home and arrange for some one to drive you home from hospital.    Special Instructions:   Please read over the following fact sheets that you were given:               1. MRSA  INFORMATION                      2. [place] PREPARING FOR SURGERY SHEET               3. STOP ASPIRIN                                                 X_____________________________________________________________________        Failure to follow these instructions may result in cancellation of your surgery

## 2012-08-20 ENCOUNTER — Telehealth: Payer: Self-pay | Admitting: Physician Assistant

## 2012-08-20 NOTE — Telephone Encounter (Signed)
Pt called because she is to have a urology procedure tomorrow and was concerned about taking her Multaq early as they want her to take it before she arrives at 5:15 am. She still has occasional bouts of afib but tolerates it pretty well.  Advised her she could take this pm dose a little early and then take the am dose just before she leaves the house. Agreed with MD that it would be a good idea for her to have this med on board before the procedure. Pt stated she would do so. No other problems or concerns.

## 2012-08-20 NOTE — Anesthesia Preprocedure Evaluation (Addendum)
Anesthesia Evaluation  Patient identified by MRN, date of birth, ID band Patient awake    Reviewed: Allergy & Precautions, H&P , NPO status , Patient's Chart, lab work & pertinent test results, reviewed documented beta blocker date and time   Airway Mallampati: I TM Distance: >3 FB Neck ROM: full    Dental  (+) Edentulous Upper   Pulmonary shortness of breath,  breath sounds clear to auscultation  Pulmonary exam normal       Cardiovascular hypertension, Pt. on home beta blockers + CAD and + Past MI + dysrhythmias (S/P ablation procedure) Atrial Fibrillation + pacemaker Rhythm:irregular Rate:Normal     Neuro/Psych PSYCHIATRIC DISORDERS Anxiety    GI/Hepatic Neg liver ROS, hiatal hernia, GERD-  Medicated,  Endo/Other  Hypothyroidism   Renal/GU negative Renal ROS     Musculoskeletal   Abdominal   Peds  Hematology negative hematology ROS (+)   Anesthesia Other Findings   Reproductive/Obstetrics                        Anesthesia Physical Anesthesia Plan  ASA: III  Anesthesia Plan: General   Post-op Pain Management:    Induction: Intravenous  Airway Management Planned: LMA  Additional Equipment:   Intra-op Plan:   Post-operative Plan: Extubation in OR  Informed Consent: I have reviewed the patients History and Physical, chart, labs and discussed the procedure including the risks, benefits and alternatives for the proposed anesthesia with the patient or authorized representative who has indicated his/her understanding and acceptance.   Dental advisory given  Plan Discussed with: CRNA  Anesthesia Plan Comments:         Anesthesia Quick Evaluation                                   Anesthesia Evaluation  Patient identified by MRN, date of birth, ID band Patient awake    Reviewed: Allergy & Precautions, H&P , NPO status , Patient's Chart, lab work & pertinent test  results  Airway Mallampati: I TM Distance: >3 FB Neck ROM: full    Dental   Pulmonary shortness of breath,          Cardiovascular hypertension, + CAD + dysrhythmias Atrial Fibrillation + pacemaker Rhythm:irregular Rate:Normal     Neuro/Psych PSYCHIATRIC DISORDERS Anxiety    GI/Hepatic hiatal hernia, GERD-  ,  Endo/Other  Hypothyroidism   Renal/GU      Musculoskeletal   Abdominal   Peds  Hematology   Anesthesia Other Findings   Reproductive/Obstetrics                          Anesthesia Physical Anesthesia Plan  ASA: III  Anesthesia Plan: General   Post-op Pain Management:    Induction: Intravenous  Airway Management Planned: LMA  Additional Equipment:   Intra-op Plan:   Post-operative Plan: Extubation in OR  Informed Consent: I have reviewed the patients History and Physical, chart, labs and discussed the procedure including the risks, benefits and alternatives for the proposed anesthesia with the patient or authorized representative who has indicated his/her understanding and acceptance.     Plan Discussed with: CRNA, Anesthesiologist and Surgeon  Anesthesia Plan Comments:         Anesthesia Quick Evaluation

## 2012-08-21 ENCOUNTER — Ambulatory Visit: Payer: Medicare Other | Admitting: Licensed Clinical Social Worker

## 2012-08-21 ENCOUNTER — Encounter (HOSPITAL_COMMUNITY): Payer: Self-pay | Admitting: Anesthesiology

## 2012-08-21 ENCOUNTER — Ambulatory Visit (HOSPITAL_COMMUNITY)
Admission: RE | Admit: 2012-08-21 | Discharge: 2012-08-21 | Disposition: A | Discharge status: Home / Self Care | Payer: Medicare Other | Source: Ambulatory Visit | Attending: Urology | Admitting: Urology

## 2012-08-21 ENCOUNTER — Encounter (HOSPITAL_COMMUNITY): Payer: Self-pay | Admitting: *Deleted

## 2012-08-21 ENCOUNTER — Encounter (HOSPITAL_COMMUNITY)
Admission: RE | Disposition: A | Discharge status: Home / Self Care | Payer: Self-pay | Source: Ambulatory Visit | Attending: Urology

## 2012-08-21 ENCOUNTER — Ambulatory Visit (HOSPITAL_COMMUNITY): Payer: Medicare Other | Admitting: Anesthesiology

## 2012-08-21 DIAGNOSIS — I10 Essential (primary) hypertension: Secondary | ICD-10-CM | POA: Diagnosis not present

## 2012-08-21 DIAGNOSIS — K219 Gastro-esophageal reflux disease without esophagitis: Secondary | ICD-10-CM | POA: Insufficient documentation

## 2012-08-21 DIAGNOSIS — Z01812 Encounter for preprocedural laboratory examination: Secondary | ICD-10-CM | POA: Insufficient documentation

## 2012-08-21 DIAGNOSIS — N135 Crossing vessel and stricture of ureter without hydronephrosis: Secondary | ICD-10-CM | POA: Diagnosis not present

## 2012-08-21 DIAGNOSIS — E039 Hypothyroidism, unspecified: Secondary | ICD-10-CM | POA: Insufficient documentation

## 2012-08-21 DIAGNOSIS — R310 Gross hematuria: Secondary | ICD-10-CM | POA: Diagnosis not present

## 2012-08-21 DIAGNOSIS — I252 Old myocardial infarction: Secondary | ICD-10-CM | POA: Insufficient documentation

## 2012-08-21 DIAGNOSIS — Z7982 Long term (current) use of aspirin: Secondary | ICD-10-CM | POA: Insufficient documentation

## 2012-08-21 DIAGNOSIS — E7800 Pure hypercholesterolemia, unspecified: Secondary | ICD-10-CM | POA: Diagnosis not present

## 2012-08-21 DIAGNOSIS — Z79899 Other long term (current) drug therapy: Secondary | ICD-10-CM | POA: Insufficient documentation

## 2012-08-21 DIAGNOSIS — R319 Hematuria, unspecified: Secondary | ICD-10-CM | POA: Diagnosis not present

## 2012-08-21 DIAGNOSIS — N1330 Unspecified hydronephrosis: Secondary | ICD-10-CM | POA: Insufficient documentation

## 2012-08-21 HISTORY — PX: CYSTOSCOPY WITH RETROGRADE PYELOGRAM, URETEROSCOPY AND STENT PLACEMENT: SHX5789

## 2012-08-21 LAB — MRSA CULTURE

## 2012-08-21 SURGERY — CYSTOURETEROSCOPY, WITH RETROGRADE PYELOGRAM AND STENT INSERTION
Anesthesia: General | Site: Ureter | Laterality: Right | Wound class: Clean Contaminated

## 2012-08-21 MED ORDER — FENTANYL CITRATE 0.05 MG/ML IJ SOLN
25.0000 ug | INTRAMUSCULAR | Status: DC | PRN
  Administered 2012-08-21: 50 ug via INTRAVENOUS

## 2012-08-21 MED ORDER — PROMETHAZINE HCL 25 MG/ML IJ SOLN: 6.2500 mg | INTRAMUSCULAR | Status: DC | PRN

## 2012-08-21 MED ORDER — LIDOCAINE HCL 1 % IJ SOLN
INTRAMUSCULAR | Status: DC | PRN
  Administered 2012-08-21 (×2): 25 mg via INTRADERMAL
  Administered 2012-08-21: 50 mg via INTRADERMAL

## 2012-08-21 MED ORDER — CEPHALEXIN 500 MG PO CAPS: 500.0000 mg | ORAL_CAPSULE | Freq: Two times a day (BID) | ORAL | Status: DC

## 2012-08-21 MED ORDER — LACTATED RINGERS IV SOLN
INTRAVENOUS | Status: DC
  Administered 2012-08-21: 09:00:00 via INTRAVENOUS

## 2012-08-21 MED ORDER — SODIUM CHLORIDE 0.9 % IR SOLN
Status: DC | PRN
  Administered 2012-08-21: 1000 mL
  Administered 2012-08-21: 3000 mL

## 2012-08-21 MED ORDER — FENTANYL CITRATE 0.05 MG/ML IJ SOLN
INTRAMUSCULAR | Status: DC | PRN
  Administered 2012-08-21: 50 ug via INTRAVENOUS

## 2012-08-21 MED ORDER — LACTATED RINGERS IV SOLN
INTRAVENOUS | Status: DC | PRN
  Administered 2012-08-21: 07:00:00 via INTRAVENOUS

## 2012-08-21 MED ORDER — MIDAZOLAM HCL 5 MG/5ML IJ SOLN
INTRAMUSCULAR | Status: DC | PRN
  Administered 2012-08-21: 1 mg via INTRAVENOUS
  Administered 2012-08-21 (×2): 0.5 mg via INTRAVENOUS

## 2012-08-21 MED ORDER — OXYBUTYNIN CHLORIDE 5 MG PO TABS: 5.0000 mg | ORAL_TABLET | Freq: Three times a day (TID) | ORAL | Status: DC

## 2012-08-21 MED ORDER — ONDANSETRON HCL 4 MG/2ML IJ SOLN
4.0000 mg | Freq: Four times a day (QID) | INTRAMUSCULAR | Status: DC | PRN
  Administered 2012-08-21: 4 mg via INTRAVENOUS

## 2012-08-21 MED ORDER — ACETAMINOPHEN 10 MG/ML IV SOLN: 1000.0000 mg | Freq: Once | INTRAVENOUS | Status: DC | PRN

## 2012-08-21 MED ORDER — CEFAZOLIN SODIUM-DEXTROSE 2-3 GM-% IV SOLR
2.0000 g | INTRAVENOUS | Status: AC
  Administered 2012-08-21: 2 g via INTRAVENOUS

## 2012-08-21 MED ORDER — PROPOFOL 10 MG/ML IV BOLUS
INTRAVENOUS | Status: DC | PRN
  Administered 2012-08-21: 160 mg via INTRAVENOUS

## 2012-08-21 MED ORDER — IOHEXOL 300 MG/ML  SOLN
INTRAMUSCULAR | Status: DC | PRN
  Administered 2012-08-21: 50 mL via INTRAVENOUS

## 2012-08-21 MED ORDER — FENTANYL CITRATE 0.05 MG/ML IJ SOLN
INTRAMUSCULAR | Status: AC
  Filled 2012-08-21: qty 2

## 2012-08-21 MED ORDER — MEPERIDINE HCL 50 MG/ML IJ SOLN: 6.2500 mg | INTRAMUSCULAR | Status: DC | PRN

## 2012-08-21 MED ORDER — CEFAZOLIN SODIUM-DEXTROSE 2-3 GM-% IV SOLR
INTRAVENOUS | Status: AC
  Filled 2012-08-21: qty 50

## 2012-08-21 MED ORDER — IOHEXOL 300 MG/ML  SOLN
INTRAMUSCULAR | Status: AC
  Filled 2012-08-21: qty 1

## 2012-08-21 MED ORDER — INDIGOTINDISULFONATE SODIUM 8 MG/ML IJ SOLN
INTRAMUSCULAR | Status: AC
  Filled 2012-08-21: qty 5

## 2012-08-21 MED ORDER — ONDANSETRON HCL 4 MG/2ML IJ SOLN
INTRAMUSCULAR | Status: AC
  Filled 2012-08-21: qty 2

## 2012-08-21 SURGICAL SUPPLY — 21 items
ADAPTER CATH URET PLST 4-6FR (CATHETERS) ×2 IMPLANT
ADPR CATH URET STRL DISP 4-6FR (CATHETERS) ×1
BAG URO CATCHER STRL LF (DRAPE) ×2 IMPLANT
CATH INTERMIT  6FR 70CM (CATHETERS) ×1 IMPLANT
CATH URET 5FR 28IN CONE TIP (BALLOONS)
CATH URET 5FR 70CM CONE TIP (BALLOONS) ×1 IMPLANT
CLOTH BEACON ORANGE TIMEOUT ST (SAFETY) ×2 IMPLANT
DRAPE CAMERA CLOSED 9X96 (DRAPES) ×2 IMPLANT
GLOVE BIOGEL M 8.0 STRL (GLOVE) ×2 IMPLANT
GOWN PREVENTION PLUS XLARGE (GOWN DISPOSABLE) ×2 IMPLANT
GOWN STRL REIN XL XLG (GOWN DISPOSABLE) ×2 IMPLANT
GUIDEWIRE STR DUAL SENSOR (WIRE) ×2 IMPLANT
KIT BALLIN UROMAX 15FX10 (LABEL) IMPLANT
MANIFOLD NEPTUNE II (INSTRUMENTS) ×2 IMPLANT
PACK CYSTO (CUSTOM PROCEDURE TRAY) ×2 IMPLANT
SET HIGH PRES BAL DIL (LABEL) ×1
SHEATH ACCESS URETERAL 24CM (SHEATH) ×1 IMPLANT
STENT CONTOUR 6FRX24X.038 (STENTS) ×1 IMPLANT
SYR CONTROL 10ML LL (SYRINGE) ×1 IMPLANT
TUBING CONNECTING 10 (TUBING) ×2 IMPLANT
WIRE COONS/BENSON .038X145CM (WIRE) ×2 IMPLANT

## 2012-08-21 NOTE — Op Note (Signed)
Preoperative diagnosis: Gross hematuria with right ureteral obstruction and  Postoperative diagnosis: No evidence of right ureteral obstruction or right renal abnormality   Procedure: Cystoscopy, right retrograde ureteropyelogram, right ureteroscopy, right renal washings, placement of double-J stent (6 [place] by 24 cm with string), interpretive fluoroscopy    Surgeon: Bertram Millard. Aren Cherne, M.D.   Anesthesia: Gen.   Complications: None  Specimen(s): Right renal washings for cytology  Drain(s): 6 x 24 cm double-J stent  Indications: 67 year-old female with recent presentation of right flank pain and gross hematuria. Cystoscopy with in the office was negative, CT scan revealed right ureteral obstruction with an upper ureteral filling defect and thickening of the right sided urothelium in the pelvic calyceal area. She presents at this time for cystoscopy, right retrograde and ureteroscopy. Risks and complications of the procedure have discussed with the patient. She understands these and desires to proceed.    Technique and findings: The patient was properly marked and identified in the holding area. She was taken to the operating room where general anesthetic was administered with LMA. She was placed in the dorsolithotomy. Preoperative IV antibiotics were administered. Genitalia and perineum were prepped and draped. Proper timeout was then performed.  I then passed a 22 French panendoscope in her bladder which was inspected circumferentially. There were no tumors, trabeculations or foreign bodies. Ureteral orifices were normal in configuration and location. There was no blood bloody flux from either ureteral orifice. As the left renal unit was normal on prior CT scan, I did not perform a left retrograde.   Right retrograde ureteropyelogram was performed. This revealed a normal appearing ureter without evidence of filling defects or obstruction. There was free flow of the contrast in the right  pyelocalyceal system. The pyelocalyceal system appeared normal, without evidence of filling defects. Pictures were taken of this.  I then passed a guidewire up in the right renal pelvis fluoroscopically. The entire ureter was dilated with a 10 cm ureteral balloon dilator for ureteroscopic access, as I was unable to pass the rigid ureteroscope without this. Entire visualization was made of the right distal ureter after this, and no urothelial abnormalities were noted. There was no stricture or stone. The digital ureteroscope was advanced into the right renal pelvis and in the calyceal system. The calyces were systematically entered and inspected. There was no stone or urothelial abnormality seen. The renal pelvis was normal. It performed washings of the right renal pelvis with saline. These were sent for cytology as "right renal washings". Following thorough inspection of the pyelocalyceal system on the right, I then backed the ureteroscope all the way down the ureter. Again, no urothelial abnormalities were seen. A guidewire was replaced following removal of the ureteroscope, and cystoscopically over the guidewire a 24 cm x 6 French contour double-J stent was placed with the string attached. Excellent distal and proximal curls were seen using cystoscopic and fluoroscopic guidance, respectively. The bladder was drained and the scope removed. The patient then was taken to the PACU, having tolerated the procedure well.

## 2012-08-21 NOTE — Transfer of Care (Signed)
Immediate Anesthesia Transfer of Care Note  Patient: Cheryl [name]  Procedure(s) Performed: Procedure(s) (LRB) with comments: CYSTOSCOPY WITH RETROGRADE PYELOGRAM, URETEROSCOPY AND STENT PLACEMENT (Right) - URETEROSCOPY WITH BIOPSY     Patient Location: PACU  Anesthesia Type:General  Level of Consciousness: awake, alert , oriented and patient cooperative  Airway & Oxygen Therapy: Patient Spontanous Breathing and Patient connected to face mask oxygen  Post-op Assessment: Report given to PACU RN, Post -op Vital signs reviewed and stable and Patient moving all extremities  Post vital signs: Reviewed and stable  Complications: No apparent anesthesia complications

## 2012-08-21 NOTE — Interval H&P Note (Signed)
History and Physical Interval Note:  08/21/2012 7:33 AM  Cheryl [name]  has presented today for surgery, with the diagnosis of HEMATURIA, RIGHT URETERAL OBSTRUCTION  The various methods of treatment have been discussed with the patient and family. After consideration of risks, benefits and other options for treatment, the patient has consented to  Procedure(s) (LRB) with comments: CYSTOSCOPY WITH RETROGRADE PYELOGRAM, URETEROSCOPY AND STENT PLACEMENT (Right) - URETEROSCOPY WITH BIOPSY    as a surgical intervention .  The patient's history has been reviewed, patient examined, no change in status, stable for surgery.  I have reviewed the patient's chart and labs.  Questions were answered to the patient's satisfaction.     Chelsea Aus

## 2012-08-21 NOTE — Progress Notes (Signed)
Dr. Retta Diones notified to request prescription for pain medication upon discharge.  Also clarified when pt should restart aspirin & xarelto.  Pt informed of response. Maeola Harman

## 2012-08-21 NOTE — H&P (Signed)
H&P  Chief Complaint: Blood in urine  History of Present Illness: Cheryl [name] is a 67 y.o. year old female who presents at this time for definitive evaluation of gross hematuria and a filling defect with subsequent hydronephrosis of her right kidney. She was originally seen on 08/11/2012 for hematuria. Her original note is below:   This 67 year old female comes in today, urgently referred by Dr. Ihor Dow for intermittent, persistent gross hematuria. This first came on in December. She had an episode in mid December, an episode Christmas Eve, and then more recently within the past day or 2. She is having some right lower quadrant and flank pain, crampy in nature today. She has no nausea or vomiting. She has had no dysuria, change in her urinary pattern, left abdominal or flank pain. There is no prior history of kidney stones or family history of stones. She is not had to see a urologist on a regular basis. At the time of initial hematuria, she was on an antibiotic. Culture of her urine in December was negative, although she did have leukocytes and nitrite positive.  She has a remote history of cigarette smoking.  Cystoscopy at that visit was unremarkable.  She is on Xaralto for history of atrial fibrillation. She has had an ablation. She has not been taken off of this medication.  Past Medical History  Diagnosis Date  . HTN (hypertension)   . Hypercholesteremia   . GERD (gastroesophageal reflux disease)   . Hypothyroidism   . Anemia   . Blood transfusion ~ 1962    " couple; no reaction " (05/30/2012)  . H/O hiatal hernia   . Arthritis   . Coronary artery disease     pci OF om  . Pacemaker 10/2011  . PAF (paroxysmal atrial fibrillation)   . Tachy-brady syndrome     a. post-termination pauses in setting of PAF;  b. 10/28/11 - St. Jude Dual Chamber PPM SN [id number]   . Myocardial infarction     "very very light" (05/30/2012)  . Ureteral obstruction, right   . Depression     Past  Surgical History  Procedure Date  . Tee without cardioversion 05/29/2012    Procedure: TRANSESOPHAGEAL ECHOCARDIOGRAM (TEE);  Surgeon: Quintella Reichert, MD;  Location: [facility] ENDOSCOPY;  Service: Cardiovascular;  Laterality: N/A;  h/p in file drawer/dl  . Atrial fibrillation ablation 05/30/2012  . Breast surgery 1990's    "right radial scar" (05/30/2012)  . Insert / replace / remove pacemaker 10/28/2011    initial placement  . Coronary angioplasty ~ 04/2012    Home Medications:  Medications Prior to Admission  Medication Sig Dispense Refill  . CRESTOR 10 MG tablet Take 10 mg by mouth every evening.       . dronedarone (MULTAQ) 400 MG tablet Take 1 tablet (400 mg total) by mouth 2 (two) times daily with a meal. As previously directed by Dr. Johney Frame.  60 tablet    . famotidine (PEPCID) 20 MG tablet Take 1 tablet (20 mg total) by mouth daily.  30 tablet  12  . levothyroxine (SYNTHROID, LEVOTHROID) 100 MCG tablet Take 100 mcg by mouth daily before breakfast.       . metoprolol succinate (TOPROL-XL) 50 MG 24 hr tablet Take 75 mg by mouth daily before lunch. Takes 1 and 1/2 tablet      . aspirin EC 81 MG tablet Take 81 mg by mouth daily.      . nitroGLYCERIN (NITROSTAT) 0.4 MG SL tablet Place  1 tablet (0.4 mg total) under the tongue every 5 (five) minutes x 3 doses as needed for chest pain.  25 tablet  5  . Rivaroxaban (XARELTO) 20 MG TABS Take 20 mg by mouth daily.        Allergies:  Allergies  Allergen Reactions  . Latex Itching  . Protonix (Pantoprazole Sodium) Other (See Comments)    "think it caused me to have a fib"  . Codeine Nausea And Vomiting  . Tape Rash    "Paper tape is ok"    Family History  Problem Relation Age of Onset  . Heart attack Father 43  . Heart attack Mother 16  . Cancer Mother     Died age 83 with pancreatic cancer, also had uterine and breast cancer    Social History:  reports that she quit smoking about 35 years ago. Her smoking use included Cigarettes. She  has a 1.8 pack-year smoking history. She has never used smokeless tobacco. She reports that she does not drink alcohol or use illicit drugs.  ROS: Genitourinary, constitutional, skin, eye, otolaryngeal, hematologic/lymphatic, cardiovascular, pulmonary, endocrine, musculoskeletal, gastrointestinal, neurological and psychiatric system(s) were reviewed and pertinent findings if present are noted.  Genitourinary: nocturia, incontinence and hematuria.  Gastrointestinal: heartburn.  Integumentary: pruritus.  Hematologic/Lymphatic: a tendency to easily bruise.  Musculoskeletal: back pain and joint pain.  Psychiatric: depression and anxiety.      Physical Exam:  Vital signs in last 24 hours: Temp:  [98.4 F (36.9 C)] 98.4 F (36.9 C) (01/20 0516) Pulse Rate:  [80] 80  (01/20 0516) Resp:  [18] 18  (01/20 0516) BP: (162)/(82) 162/82 mmHg (01/20 0516) SpO2:  [100 %] 100 % (01/20 0516) Constitutional: Well nourished and well developed . No acute distress.  ENT:. The ears and nose are normal in appearance.  Neck: The appearance of the neck is normal and no neck mass is present.  Pulmonary: No respiratory distress and normal respiratory rhythm and effort.  Cardiovascular: Heart rate and rhythm are normal . No peripheral edema.  Abdomen: The abdomen is mildly obese. No masses are palpated. Mild tenderness in the RUQ is present and tenderness in the RLQ is present. No CVA tenderness.  Genitourinary:  Chaperone Present: Alfonzo Beers.  Examination of the external genitalia shows normal female external genitalia and no lesions. There is no urethral mass. Vaginal exam demonstrates no abnormalities. The bladder is normal on palpation. The anus is normal on inspection. The perineum is normal on inspection.  Lymphatics: The femoral and inguinal nodes are not enlarged or tender.  Skin: Normal skin turgor, no visible rash and no visible skin lesions.  Neuro/Psych:. Mood and affect are appropriate.    Laboratory Data:  No results found for this or any previous visit (from the past 24 hour(s)). Recent Results (from the past 240 hour(s))  SURGICAL PCR SCREEN     Status: Abnormal   Collection Time   08/18/12 12:06 PM      Component Value Range Status Comment   MRSA, PCR INVALID RESULTS, SPECIMEN SENT FOR CULTURE (*) NEGATIVE Final    Staphylococcus aureus INVALID RESULTS, SPECIMEN SENT FOR CULTURE (*) NEGATIVE Final   MRSA CULTURE     Status: Normal (Preliminary result)   Collection Time   08/18/12 12:06 PM      Component Value Range Status Comment   Specimen Description NOSE   Final    Special Requests NONE   Final    Culture NO SUSPICIOUS COLONIES, CONTINUING TO HOLD  Final    Report Status PENDING   Incomplete    Creatinine:  Basename 08/18/12 1210  CREATININE 1.04    Radiologic Imaging: No results found.  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  Chelsea Aus 08/21/2012, 6:25 AM  Bertram Millard. Meiko Ives MD

## 2012-08-21 NOTE — Anesthesia Postprocedure Evaluation (Signed)
Anesthesia Post Note  Patient: Cheryl [name]  Procedure(s) Performed: Procedure(s) (LRB): CYSTOSCOPY WITH RETROGRADE PYELOGRAM, URETEROSCOPY AND STENT PLACEMENT (Right)  Anesthesia type: General  Patient location: PACU  Post pain: Pain level controlled  Post assessment: Post-op Vital signs reviewed  Last Vitals:  Filed Vitals:   08/21/12 0900  BP: 147/69  Pulse: 60  Temp:   Resp: 13    Post vital signs: Reviewed  Level of consciousness: sedated  Complications: No apparent anesthesia complications

## 2012-08-22 ENCOUNTER — Encounter (HOSPITAL_COMMUNITY): Payer: Self-pay | Admitting: Urology

## 2012-08-24 DIAGNOSIS — I4891 Unspecified atrial fibrillation: Secondary | ICD-10-CM | POA: Diagnosis not present

## 2012-08-24 DIAGNOSIS — I2510 Atherosclerotic heart disease of native coronary artery without angina pectoris: Secondary | ICD-10-CM | POA: Diagnosis not present

## 2012-08-24 DIAGNOSIS — E785 Hyperlipidemia, unspecified: Secondary | ICD-10-CM | POA: Diagnosis not present

## 2012-08-24 DIAGNOSIS — H2510 Age-related nuclear cataract, unspecified eye: Secondary | ICD-10-CM | POA: Diagnosis not present

## 2012-08-24 DIAGNOSIS — I10 Essential (primary) hypertension: Secondary | ICD-10-CM | POA: Diagnosis not present

## 2012-08-24 DIAGNOSIS — Z950 Presence of cardiac pacemaker: Secondary | ICD-10-CM | POA: Diagnosis not present

## 2012-08-24 DIAGNOSIS — H524 Presbyopia: Secondary | ICD-10-CM | POA: Diagnosis not present

## 2012-08-31 ENCOUNTER — Encounter: Payer: Self-pay | Admitting: Internal Medicine

## 2012-08-31 ENCOUNTER — Ambulatory Visit (INDEPENDENT_AMBULATORY_CARE_PROVIDER_SITE_OTHER): Payer: Medicare Other | Admitting: Internal Medicine

## 2012-08-31 VITALS — BP 151/65 | HR 60 | Ht 63.0 in | Wt 180.4 lb

## 2012-08-31 DIAGNOSIS — I495 Sick sinus syndrome: Secondary | ICD-10-CM | POA: Diagnosis not present

## 2012-08-31 DIAGNOSIS — I4891 Unspecified atrial fibrillation: Secondary | ICD-10-CM | POA: Diagnosis not present

## 2012-08-31 DIAGNOSIS — I480 Paroxysmal atrial fibrillation: Secondary | ICD-10-CM

## 2012-08-31 DIAGNOSIS — I119 Hypertensive heart disease without heart failure: Secondary | ICD-10-CM

## 2012-08-31 LAB — PACEMAKER DEVICE OBSERVATION
AL THRESHOLD: 0.875 V
ATRIAL PACING PM: 85
DEVICE MODEL PM: 7334985
RV LEAD IMPEDENCE PM: 450 Ohm
RV LEAD THRESHOLD: 1 V

## 2012-08-31 NOTE — Assessment & Plan Note (Signed)
Stable No change required today  

## 2012-08-31 NOTE — Assessment & Plan Note (Signed)
Doing well s/p ablation ERAF early has improved (only 1 episode of afib in January).   I will stop multaq at this time If she has breakthrough afib, then she can take an additional 1/2 toprol. Presently, she wishes to avoid anticoagulation  (given recent hematuria).  Her CHADS2 score is 1.  We will therefore follow off of anticoagulation for now. Eliquis may be an option if her afib burden increases and hematuria resolves.

## 2012-08-31 NOTE — Progress Notes (Signed)
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  Since her afib ablation, the patient reports doing very well.  She denies procedure related complications.  She has had some ERAF but this has recently improved.  Unfortunately, she developed hematuria with xarelto and has stopped this medicine.  Today, she denies symptoms of palpitations, chest pain, shortness of breath,  lower extremity edema, dizziness, presyncope, or syncope.  The patient is otherwise without complaint today.   Past Medical History  Diagnosis Date  . HTN (hypertension)   . Hypercholesteremia   . GERD (gastroesophageal reflux disease)   . Hypothyroidism   . Anemia   . Blood transfusion ~ 1962    " couple; no reaction " (05/30/2012)  . H/O hiatal hernia   . Arthritis   . Coronary artery disease     pci OF om  . Pacemaker 10/2011  . PAF (paroxysmal atrial fibrillation)   . Tachy-brady syndrome     a. post-termination pauses in setting of PAF;  b. 10/28/11 - St. Jude Dual Chamber PPM SN [id number]   . Myocardial infarction     "very very light" (05/30/2012)  . Ureteral obstruction, right   . Depression    Past Surgical History  Procedure Date  . Tee without cardioversion 05/29/2012    Procedure: TRANSESOPHAGEAL ECHOCARDIOGRAM (TEE);  Surgeon: Quintella Reichert, MD;  Location: [facility] ENDOSCOPY;  Service: Cardiovascular;  Laterality: N/A;  h/p in file drawer/dl  . Atrial fibrillation ablation 05/30/2012    PVI and CTi ablation by Dr Johney Frame  . Breast surgery 1990's    "right radial scar" (05/30/2012)  . Insert / replace / remove pacemaker 10/28/2011    initial placement  . Coronary angioplasty ~ 04/2012  . Cystoscopy with retrograde pyelogram, ureteroscopy and stent placement 08/21/2012    Procedure: CYSTOSCOPY WITH RETROGRADE PYELOGRAM, URETEROSCOPY AND STENT PLACEMENT;  Surgeon: Marcine Matar, MD;  Location: [facility] ORS;   Service: Urology;  Laterality: Right;  URETEROSCOPY WITH BIOPSY     Current Outpatient Prescriptions  Medication Sig Dispense Refill  . ALPRAZolam (XANAX) 0.5 MG tablet       . CRESTOR 10 MG tablet Take 10 mg by mouth every evening.       . famotidine (PEPCID) 20 MG tablet Take 1 tablet (20 mg total) by mouth daily.  30 tablet  12  . levothyroxine (SYNTHROID, LEVOTHROID) 100 MCG tablet Take 100 mcg by mouth daily before breakfast.       . metoprolol succinate (TOPROL-XL) 50 MG 24 hr tablet Take 75 mg by mouth daily before lunch. Takes 1 and 1/2 tablet      . nitroGLYCERIN (NITROSTAT) 0.4 MG SL tablet Place 1 tablet (0.4 mg total) under the tongue every 5 (five) minutes x 3 doses as needed for chest pain.  25 tablet  5    Physical Exam: Filed Vitals:   08/31/12 1353  BP: 151/65  Pulse: 60  Height: 5\' 3"  (1.6 m)  Weight: 180 lb 6.4 oz (81.829 kg)    GEN- The patient is anxious appearing, alert and oriented x 3 today.   Head- normocephalic, atraumatic Eyes-  Sclera clear, conjunctiva pink Ears- hearing intact Oropharynx- clear Lungs- Clear to ausculation bilaterally, normal work of breathing Heart- Regular rate and rhythm, no murmurs, rubs or gallops, PMI not laterally displaced GI- soft, NT, ND, + BS Extremities- no clubbing, cyanosis, or edema  Pacemaker interrogation  today is reviewed, see paceart  Assessment and Plan:

## 2012-08-31 NOTE — Assessment & Plan Note (Signed)
Normal pacemaker function See Pace Art report No changes today  

## 2012-08-31 NOTE — Patient Instructions (Addendum)
Your physician wants you to follow-up in: 09/2012 with Dr Johney Frame Bonita Quin will receive a reminder letter in the mail two months in advance. If you don't receive a letter, please call our office to schedule the follow-up appointment.  Your physician has recommended you make the following change in your medication: STOP Multaq and Xarelto

## 2012-09-01 ENCOUNTER — Ambulatory Visit (INDEPENDENT_AMBULATORY_CARE_PROVIDER_SITE_OTHER): Payer: Medicare Other | Admitting: Licensed Clinical Social Worker

## 2012-09-01 DIAGNOSIS — F410 Panic disorder [episodic paroxysmal anxiety] without agoraphobia: Secondary | ICD-10-CM

## 2012-09-04 DIAGNOSIS — R310 Gross hematuria: Secondary | ICD-10-CM | POA: Diagnosis not present

## 2012-09-04 DIAGNOSIS — R82998 Other abnormal findings in urine: Secondary | ICD-10-CM | POA: Diagnosis not present

## 2012-09-08 ENCOUNTER — Ambulatory Visit (INDEPENDENT_AMBULATORY_CARE_PROVIDER_SITE_OTHER): Payer: Medicare Other | Admitting: Licensed Clinical Social Worker

## 2012-09-08 DIAGNOSIS — F410 Panic disorder [episodic paroxysmal anxiety] without agoraphobia: Secondary | ICD-10-CM | POA: Diagnosis not present

## 2012-09-16 ENCOUNTER — Other Ambulatory Visit: Payer: Self-pay

## 2012-09-20 ENCOUNTER — Ambulatory Visit: Payer: Medicare Other | Admitting: Licensed Clinical Social Worker

## 2012-09-25 DIAGNOSIS — I4891 Unspecified atrial fibrillation: Secondary | ICD-10-CM | POA: Diagnosis not present

## 2012-09-25 DIAGNOSIS — Z79899 Other long term (current) drug therapy: Secondary | ICD-10-CM | POA: Diagnosis not present

## 2012-09-25 DIAGNOSIS — E785 Hyperlipidemia, unspecified: Secondary | ICD-10-CM | POA: Diagnosis not present

## 2012-09-25 DIAGNOSIS — I10 Essential (primary) hypertension: Secondary | ICD-10-CM | POA: Diagnosis not present

## 2012-09-25 DIAGNOSIS — I2510 Atherosclerotic heart disease of native coronary artery without angina pectoris: Secondary | ICD-10-CM | POA: Diagnosis not present

## 2012-09-27 ENCOUNTER — Ambulatory Visit (INDEPENDENT_AMBULATORY_CARE_PROVIDER_SITE_OTHER): Payer: Medicare Other | Admitting: Licensed Clinical Social Worker

## 2012-09-27 DIAGNOSIS — F410 Panic disorder [episodic paroxysmal anxiety] without agoraphobia: Secondary | ICD-10-CM | POA: Diagnosis not present

## 2012-10-09 DIAGNOSIS — E785 Hyperlipidemia, unspecified: Secondary | ICD-10-CM | POA: Diagnosis not present

## 2012-10-09 DIAGNOSIS — Z79899 Other long term (current) drug therapy: Secondary | ICD-10-CM | POA: Diagnosis not present

## 2012-10-09 DIAGNOSIS — I2510 Atherosclerotic heart disease of native coronary artery without angina pectoris: Secondary | ICD-10-CM | POA: Diagnosis not present

## 2012-10-09 DIAGNOSIS — I4891 Unspecified atrial fibrillation: Secondary | ICD-10-CM | POA: Diagnosis not present

## 2012-10-09 DIAGNOSIS — I10 Essential (primary) hypertension: Secondary | ICD-10-CM | POA: Diagnosis not present

## 2012-10-11 ENCOUNTER — Ambulatory Visit: Payer: Medicare Other | Admitting: Licensed Clinical Social Worker

## 2012-10-16 DIAGNOSIS — I2510 Atherosclerotic heart disease of native coronary artery without angina pectoris: Secondary | ICD-10-CM | POA: Diagnosis not present

## 2012-10-16 DIAGNOSIS — Z79899 Other long term (current) drug therapy: Secondary | ICD-10-CM | POA: Diagnosis not present

## 2012-10-16 DIAGNOSIS — I10 Essential (primary) hypertension: Secondary | ICD-10-CM | POA: Diagnosis not present

## 2012-10-16 DIAGNOSIS — I4891 Unspecified atrial fibrillation: Secondary | ICD-10-CM | POA: Diagnosis not present

## 2012-10-18 ENCOUNTER — Ambulatory Visit (INDEPENDENT_AMBULATORY_CARE_PROVIDER_SITE_OTHER): Payer: Medicare Other | Admitting: Licensed Clinical Social Worker

## 2012-10-18 DIAGNOSIS — F410 Panic disorder [episodic paroxysmal anxiety] without agoraphobia: Secondary | ICD-10-CM

## 2012-11-01 ENCOUNTER — Ambulatory Visit: Payer: Medicare Other | Admitting: Internal Medicine

## 2012-11-01 DIAGNOSIS — I10 Essential (primary) hypertension: Secondary | ICD-10-CM | POA: Diagnosis not present

## 2012-11-01 DIAGNOSIS — R0602 Shortness of breath: Secondary | ICD-10-CM | POA: Diagnosis not present

## 2012-11-01 DIAGNOSIS — I4891 Unspecified atrial fibrillation: Secondary | ICD-10-CM | POA: Diagnosis not present

## 2012-11-01 DIAGNOSIS — I2510 Atherosclerotic heart disease of native coronary artery without angina pectoris: Secondary | ICD-10-CM | POA: Diagnosis not present

## 2012-11-13 DIAGNOSIS — I495 Sick sinus syndrome: Secondary | ICD-10-CM | POA: Diagnosis not present

## 2012-11-13 DIAGNOSIS — Z950 Presence of cardiac pacemaker: Secondary | ICD-10-CM | POA: Diagnosis not present

## 2012-11-29 ENCOUNTER — Encounter: Payer: Self-pay | Admitting: Internal Medicine

## 2012-11-29 ENCOUNTER — Ambulatory Visit (INDEPENDENT_AMBULATORY_CARE_PROVIDER_SITE_OTHER): Payer: Medicare Other | Admitting: Internal Medicine

## 2012-11-29 VITALS — BP 140/78 | HR 66 | Ht 63.0 in | Wt 185.0 lb

## 2012-11-29 DIAGNOSIS — I4892 Unspecified atrial flutter: Secondary | ICD-10-CM

## 2012-11-29 DIAGNOSIS — I480 Paroxysmal atrial fibrillation: Secondary | ICD-10-CM

## 2012-11-29 DIAGNOSIS — I4891 Unspecified atrial fibrillation: Secondary | ICD-10-CM

## 2012-11-29 DIAGNOSIS — I495 Sick sinus syndrome: Secondary | ICD-10-CM | POA: Diagnosis not present

## 2012-11-29 DIAGNOSIS — I119 Hypertensive heart disease without heart failure: Secondary | ICD-10-CM

## 2012-11-29 LAB — PACEMAKER DEVICE OBSERVATION
AL AMPLITUDE: 4.4 mv
AL IMPEDENCE PM: 412.5 Ohm
AL THRESHOLD: 0.875 v
ATRIAL PACING PM: 41
BAMS-0001: 170 {beats}/min
BAMS-0003: 70 {beats}/min
BATTERY VOLTAGE: 2.9629 v
DEVICE MODEL PM: 7334985
RV LEAD AMPLITUDE: 12 mv
RV LEAD IMPEDENCE PM: 475 Ohm
RV LEAD THRESHOLD: 1 v
VENTRICULAR PACING PM: 1.4

## 2012-11-29 MED ORDER — METOPROLOL SUCCINATE ER 50 MG PO TB24: 50.0000 mg | ORAL_TABLET | Freq: Every day | ORAL | Status: DC

## 2012-11-29 NOTE — Patient Instructions (Addendum)
Your physician wants you to follow-up in: 6 months withDrAllred You will receive a reminder letter in the mail two months in advance. If you don't receive a letter, please call our office to schedule the follow-up appointment.   Remote monitoring is used to monitor your Pacemaker or ICD from home. This monitoring reduces the number of office visits required to check your device to one time per year. It allows [place] to keep an eye on the functioning of your device to ensure it is working properly. You are scheduled for a device check from home on 03/05/2013. You may send your transmission at any time that day. If you have a wireless device, the transmission will be sent automatically. After your physician reviews your transmission, you will receive a postcard with your next transmission date.   Your physician has recommended you make the following change in your medication:  1. Decrease Metoprolol 50 mg daily

## 2012-12-05 NOTE — Progress Notes (Signed)
PCP: Gretel Acre, MD Primary Cardiologist:  Dr [name] is a 67 y.o. female who presents today for routine electrophysiology followup.  Since her afib ablation, the patient reports doing very well. She is pleased with the results of her procedure.  Today, she denies symptoms of palpitations, chest pain, shortness of breath,  lower extremity edema, dizziness, presyncope, or syncope.  The patient is otherwise without complaint today.   Past Medical History  Diagnosis Date  . HTN (hypertension)   . Hypercholesteremia   . GERD (gastroesophageal reflux disease)   . Hypothyroidism   . Anemia   . Blood transfusion ~ 1962    " couple; no reaction " (05/30/2012)  . H/O hiatal hernia   . Arthritis   . Coronary artery disease     pci OF om  . Pacemaker 10/2011  . PAF (paroxysmal atrial fibrillation)   . Tachy-brady syndrome     a. post-termination pauses in setting of PAF;  b. 10/28/11 - St. Jude Dual Chamber PPM SN [id number]   . Myocardial infarction     "very very light" (05/30/2012)  . Ureteral obstruction, right   . Depression    Past Surgical History  Procedure Laterality Date  . Tee without cardioversion  05/29/2012    Procedure: TRANSESOPHAGEAL ECHOCARDIOGRAM (TEE);  Surgeon: Quintella Reichert, MD;  Location: [facility] ENDOSCOPY;  Service: Cardiovascular;  Laterality: N/A;  h/p in file drawer/dl  . Atrial fibrillation ablation  05/30/2012    PVI and CTi ablation by Dr Johney Frame  . Breast surgery  1990's    "right radial scar" (05/30/2012)  . Insert / replace / remove pacemaker  10/28/2011    initial placement  . Coronary angioplasty  ~ 04/2012  . Cystoscopy with retrograde pyelogram, ureteroscopy and stent placement  08/21/2012    Procedure: CYSTOSCOPY WITH RETROGRADE PYELOGRAM, URETEROSCOPY AND STENT PLACEMENT;  Surgeon: Marcine Matar, MD;  Location: [facility] ORS;  Service: Urology;  Laterality: Right;  URETEROSCOPY WITH BIOPSY     Current Outpatient Prescriptions  Medication Sig  Dispense Refill  . ALPRAZolam (XANAX) 0.5 MG tablet       . apixaban (ELIQUIS) 5 MG TABS tablet Take 5 mg by mouth 2 (two) times daily.      . CRESTOR 10 MG tablet Take 10 mg by mouth every evening.       . famotidine (PEPCID) 20 MG tablet Take 1 tablet (20 mg total) by mouth daily.  30 tablet  12  . levothyroxine (SYNTHROID, LEVOTHROID) 100 MCG tablet Take 100 mcg by mouth daily before breakfast.       . lisinopril (PRINIVIL,ZESTRIL) 10 MG tablet Take 10 mg by mouth daily.       . metoprolol succinate (TOPROL-XL) 50 MG 24 hr tablet Take 1 tablet (50 mg total) by mouth daily before lunch.  30 tablet  4  . nitroGLYCERIN (NITROSTAT) 0.4 MG SL tablet Place 1 tablet (0.4 mg total) under the tongue every 5 (five) minutes x 3 doses as needed for chest pain.  25 tablet  5   No current facility-administered medications for this visit.    Physical Exam: Filed Vitals:   11/29/12 1042  BP: 140/78  Pulse: 66  Height: 5\' 3"  (1.6 m)  Weight: 185 lb (83.915 kg)    GEN- The patient is anxious appearing, alert and oriented x 3 today.   Head- normocephalic, atraumatic Eyes-  Sclera clear, conjunctiva pink Ears- hearing intact Oropharynx- clear Lungs- Clear to ausculation bilaterally, normal  work of breathing Heart- Regular rate and rhythm, no murmurs, rubs or gallops, PMI not laterally displaced GI- soft, NT, ND, + BS Extremities- no clubbing, cyanosis, or edema  Pacemaker interrogation today is reviewed, see paceart  Assessment and Plan:

## 2012-12-05 NOTE — Assessment & Plan Note (Signed)
Stable Device interrogation reveals that her afib burden is decreased Decrease toprol today No other changes

## 2012-12-05 NOTE — Assessment & Plan Note (Signed)
Increase lisinopril for better bp control

## 2012-12-05 NOTE — Assessment & Plan Note (Signed)
Normal pacemaker function See Pace Art report No changes today  

## 2012-12-17 ENCOUNTER — Encounter: Payer: Self-pay | Admitting: Internal Medicine

## 2013-01-10 DIAGNOSIS — Z79899 Other long term (current) drug therapy: Secondary | ICD-10-CM | POA: Diagnosis not present

## 2013-01-10 DIAGNOSIS — E785 Hyperlipidemia, unspecified: Secondary | ICD-10-CM | POA: Diagnosis not present

## 2013-01-10 DIAGNOSIS — I2510 Atherosclerotic heart disease of native coronary artery without angina pectoris: Secondary | ICD-10-CM | POA: Diagnosis not present

## 2013-01-10 DIAGNOSIS — I4891 Unspecified atrial fibrillation: Secondary | ICD-10-CM | POA: Diagnosis not present

## 2013-01-10 DIAGNOSIS — I10 Essential (primary) hypertension: Secondary | ICD-10-CM | POA: Diagnosis not present

## 2013-02-15 IMAGING — CR DG CHEST 2V
2 series · 2 of 2 positions shown · non-contrast
Comparison: 10/29/2011.

CLINICAL DATA: Worsening shortness of breath.  Weakness.

CHEST - 2 VIEW

[w chest pa]
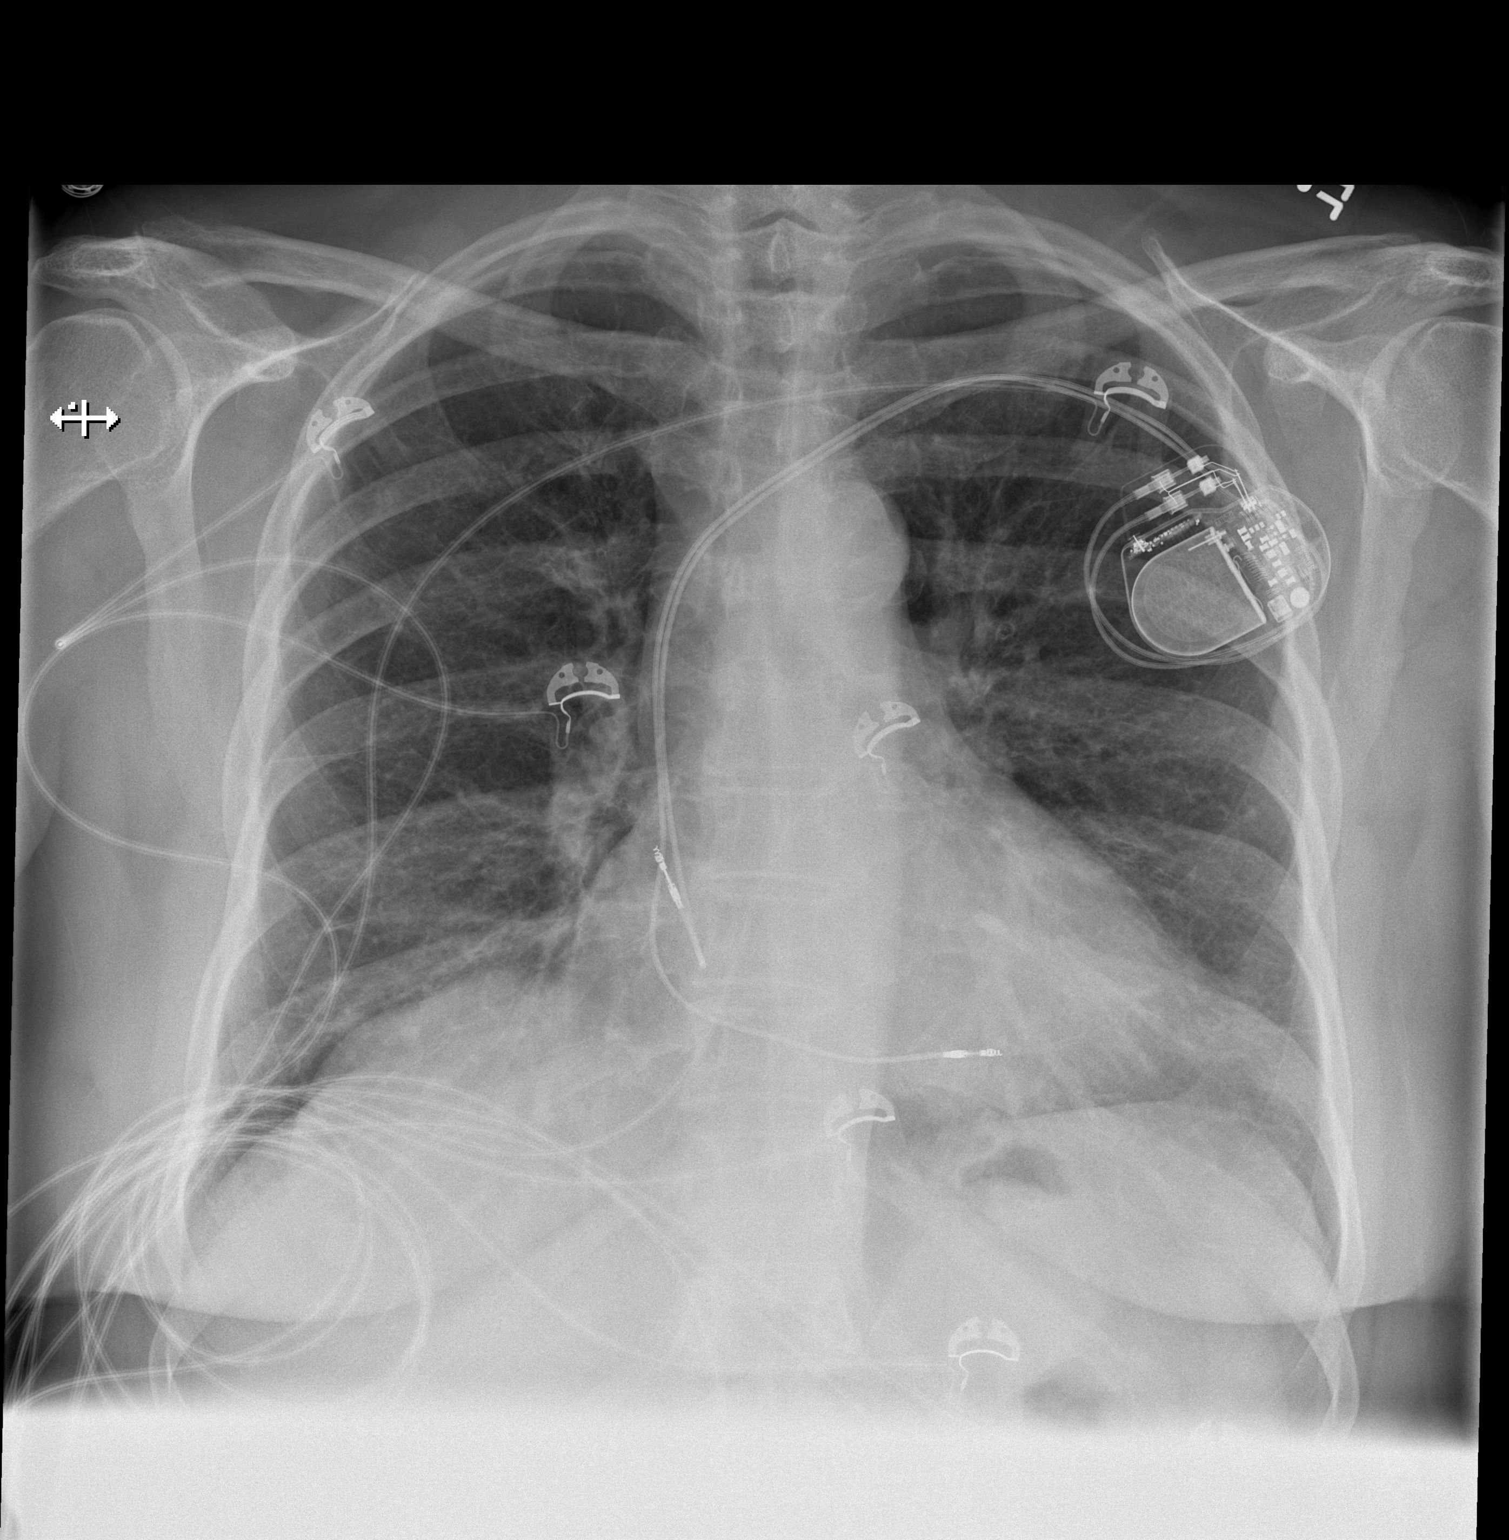

[w chest lat]
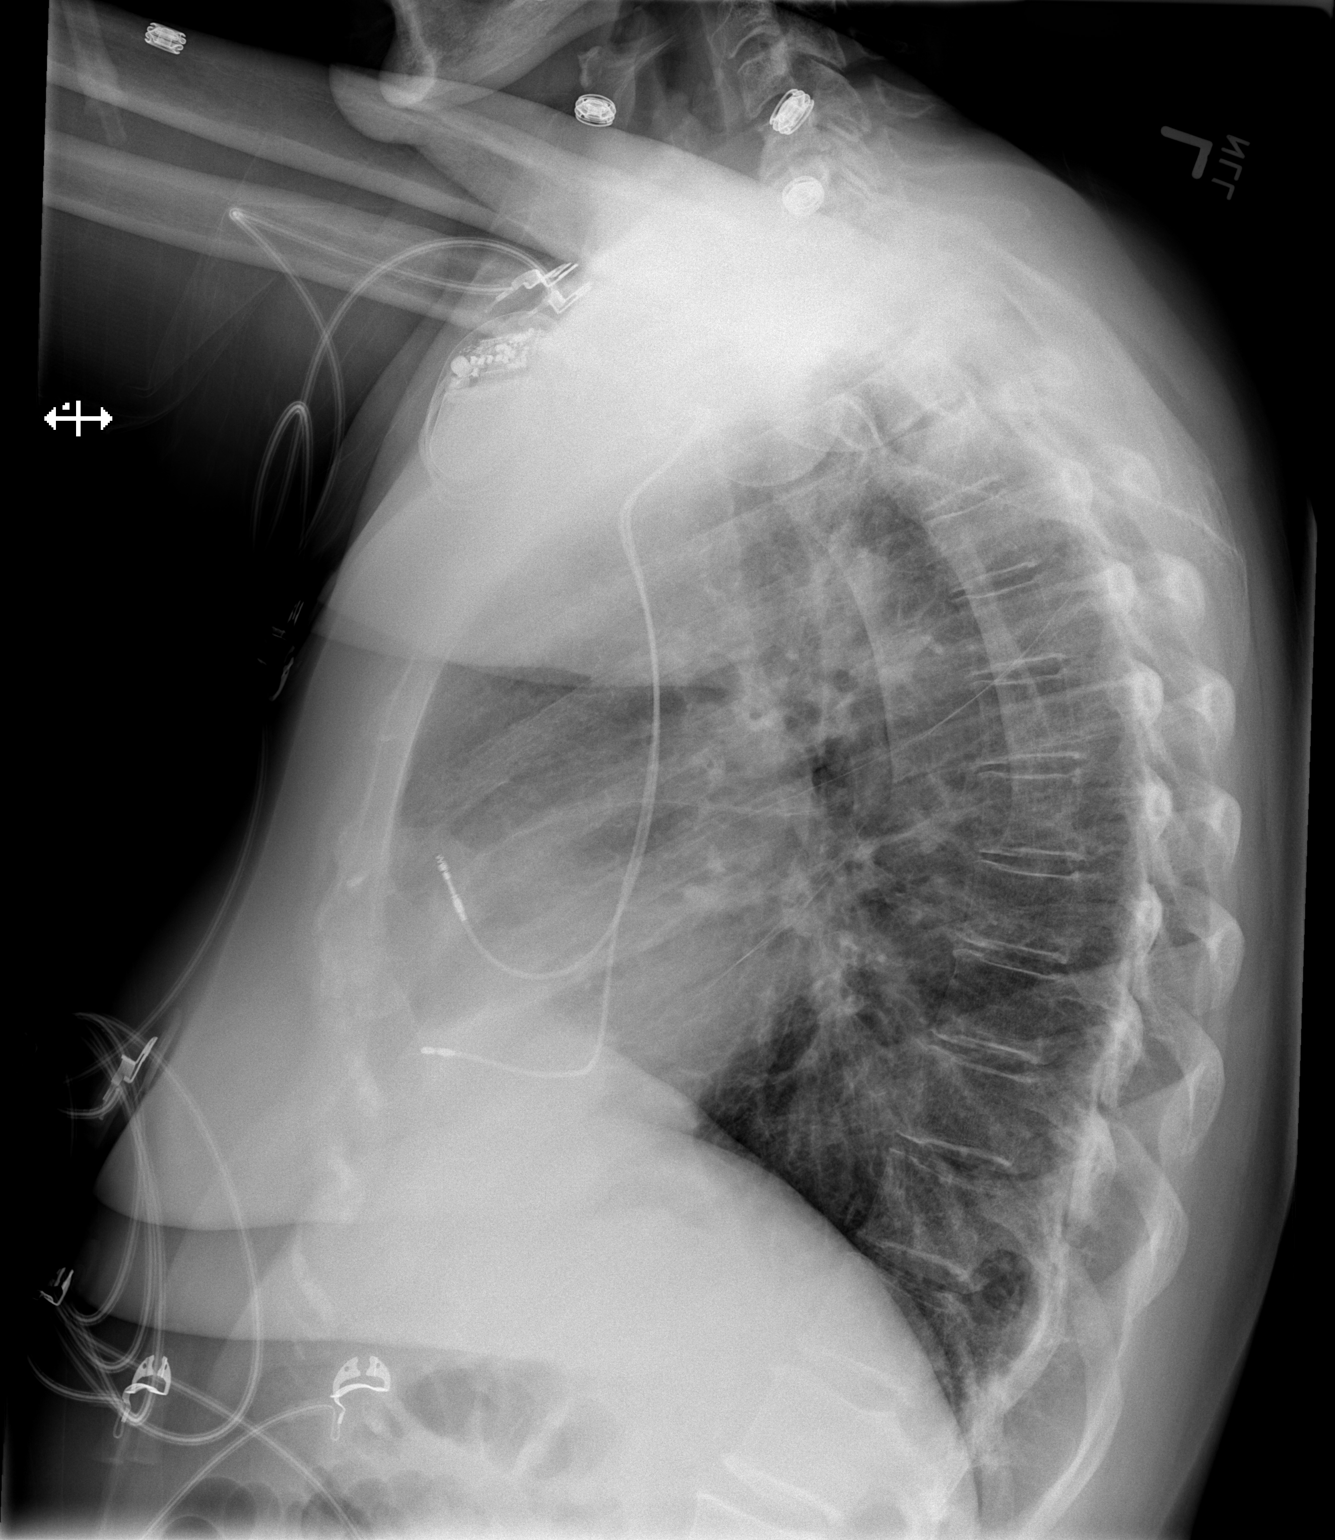

[2 of 2 positions shown; findings below may reference images not displayed]

FINDINGS: Left subclavian pacemaker leads are unchanged within the
right atrium and right ventricle.  Heart size and mediastinal
contours are stable.  There is mild central airway thickening
without confluent airspace opacity, edema or significant pleural
effusion.  Osseous structures appear unchanged.  Telemetry leads
overlie the chest.
IMPRESSION: Mild central airway thickening suggesting bronchitis.  No evidence
of pneumonia or edema.

## 2013-03-05 DIAGNOSIS — Z950 Presence of cardiac pacemaker: Secondary | ICD-10-CM | POA: Diagnosis not present

## 2013-03-05 DIAGNOSIS — I495 Sick sinus syndrome: Secondary | ICD-10-CM | POA: Diagnosis not present

## 2013-03-07 ENCOUNTER — Other Ambulatory Visit: Payer: Self-pay

## 2013-03-07 DIAGNOSIS — R911 Solitary pulmonary nodule: Secondary | ICD-10-CM | POA: Diagnosis not present

## 2013-03-07 DIAGNOSIS — N1330 Unspecified hydronephrosis: Secondary | ICD-10-CM | POA: Diagnosis not present

## 2013-03-07 DIAGNOSIS — R310 Gross hematuria: Secondary | ICD-10-CM | POA: Diagnosis not present

## 2013-03-07 DIAGNOSIS — J984 Other disorders of lung: Secondary | ICD-10-CM | POA: Diagnosis not present

## 2013-03-09 DIAGNOSIS — R310 Gross hematuria: Secondary | ICD-10-CM | POA: Diagnosis not present

## 2013-03-09 IMAGING — CR DG CHEST 1V PORT
1 series · 1 of 1 positions shown · non-contrast
Comparison: 04/02/2012

CLINICAL DATA: Palpitations.

PORTABLE CHEST - 1 VIEW

[AP]
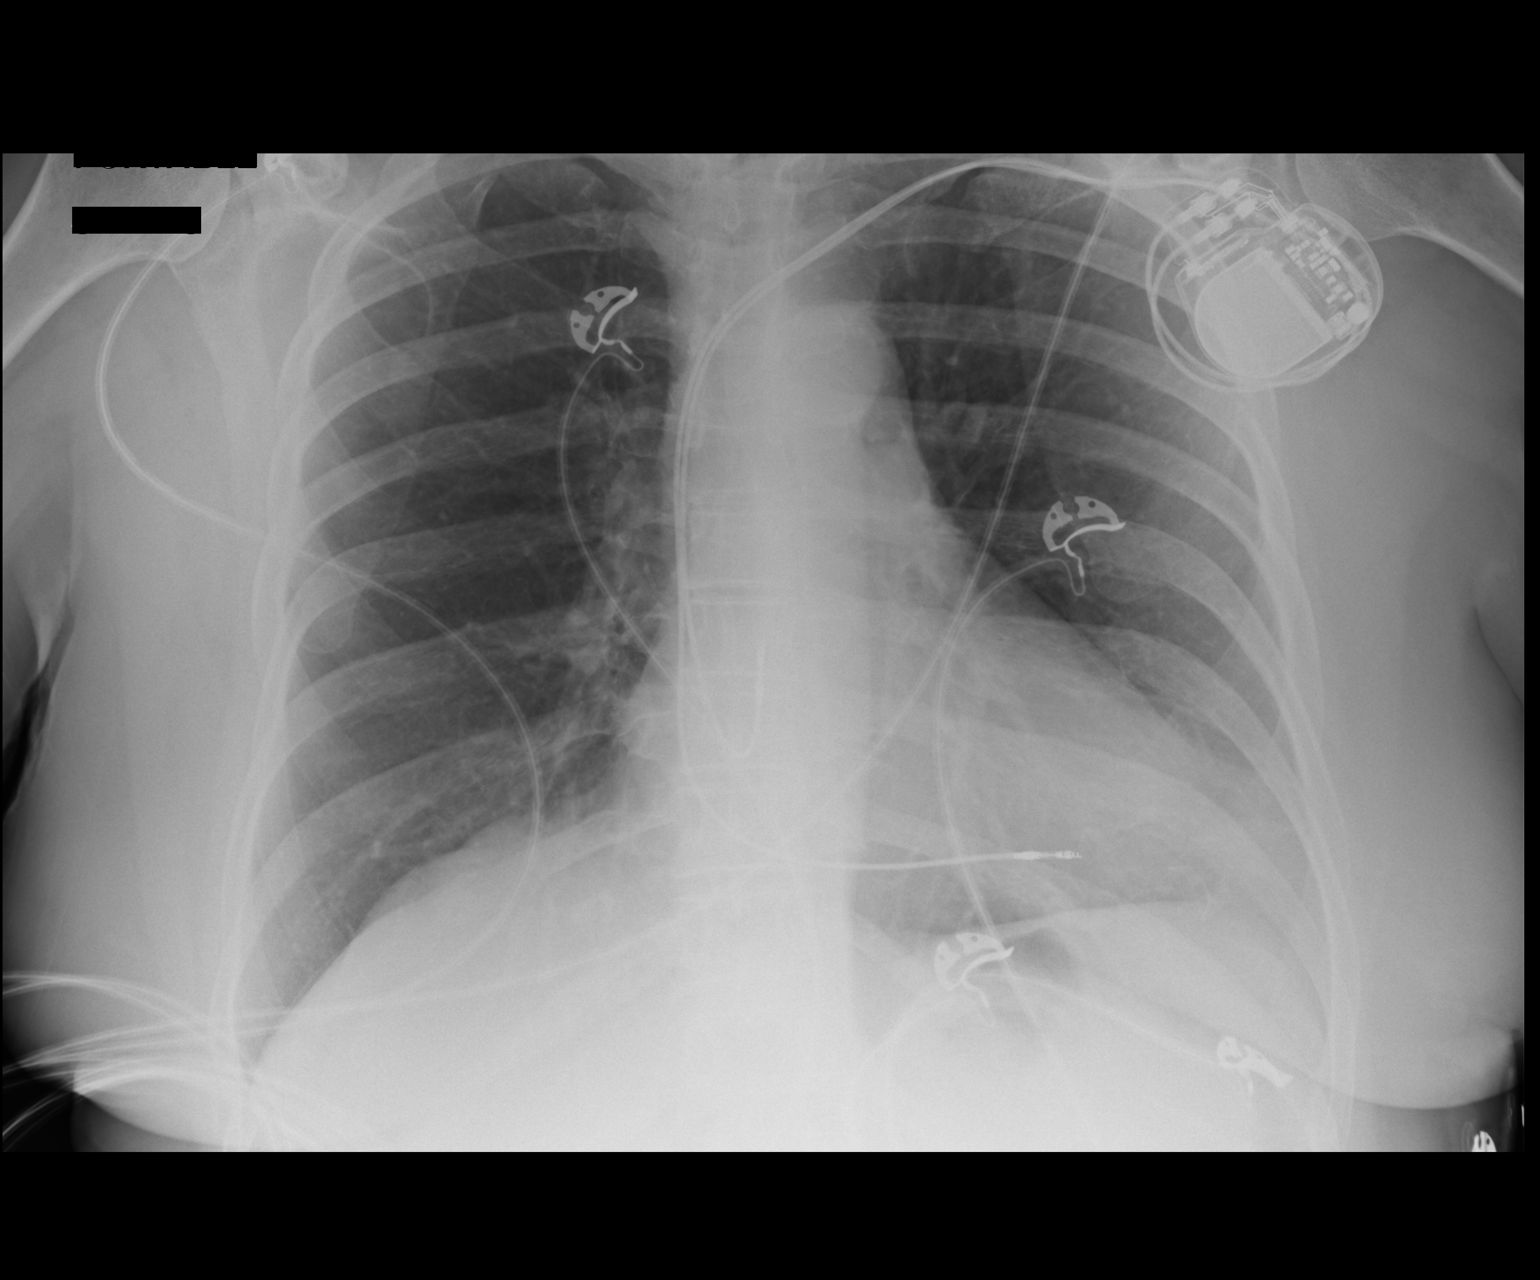

[1 of 1 positions shown; findings below may reference images not displayed]

FINDINGS: Portable sitting view of the chest demonstrates a left
cardiac dual lead pacemaker.  Cannot exclude densities at the left
costophrenic angle near the left cardiac apex.  Otherwise, the
lungs are clear.  Trachea is midline.  Negative for a pneumothorax.
IMPRESSION: Question densities at the left costophrenic angle which could be
related to the patient positioning and overlying soft tissue.  This
area could be further evaluated with PA and lateral chest views.
Otherwise, no acute chest findings.

## 2013-03-12 DIAGNOSIS — J309 Allergic rhinitis, unspecified: Secondary | ICD-10-CM | POA: Diagnosis not present

## 2013-03-12 DIAGNOSIS — E785 Hyperlipidemia, unspecified: Secondary | ICD-10-CM | POA: Diagnosis not present

## 2013-03-12 DIAGNOSIS — E039 Hypothyroidism, unspecified: Secondary | ICD-10-CM | POA: Diagnosis not present

## 2013-03-12 DIAGNOSIS — F411 Generalized anxiety disorder: Secondary | ICD-10-CM | POA: Diagnosis not present

## 2013-03-12 DIAGNOSIS — R05 Cough: Secondary | ICD-10-CM | POA: Diagnosis not present

## 2013-03-27 IMAGING — US US EXTREM LOW VENOUS*R*
1 series · 14 of 24 positions shown · non-contrast
Comparison: None.

CLINICAL DATA: Right leg pain and swelling.

RIGHT LOWER EXTREMITY VENOUS DUPLEX ULTRASOUND
TECHNIQUE: Gray-scale sonography with graded compression, as well
as color Doppler and duplex ultrasound, were performed to evaluate
the deep venous system of the lower extremity from the level of the
common femoral vein through the popliteal and proximal calf veins.
Spectral Doppler was utilized to evaluate flow at rest and with
distal augmentation maneuvers.

[Series 1: us extrem low venous*right* · 14 of 28 slices shown]
[im 1/28]
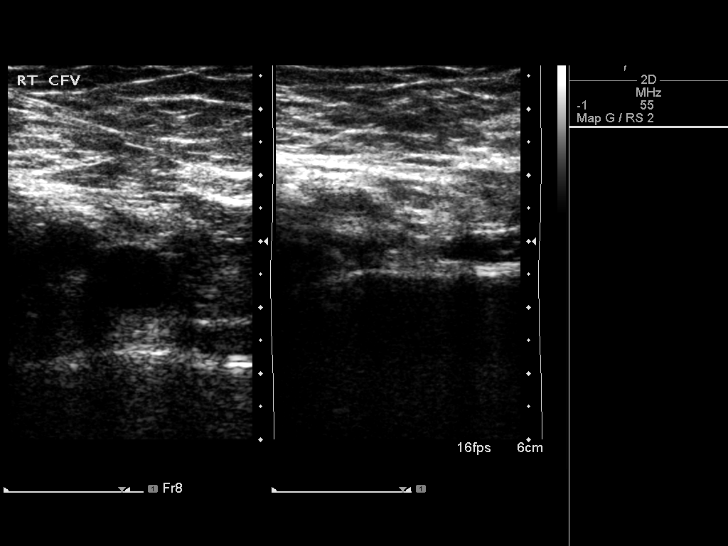
[im 3/28]
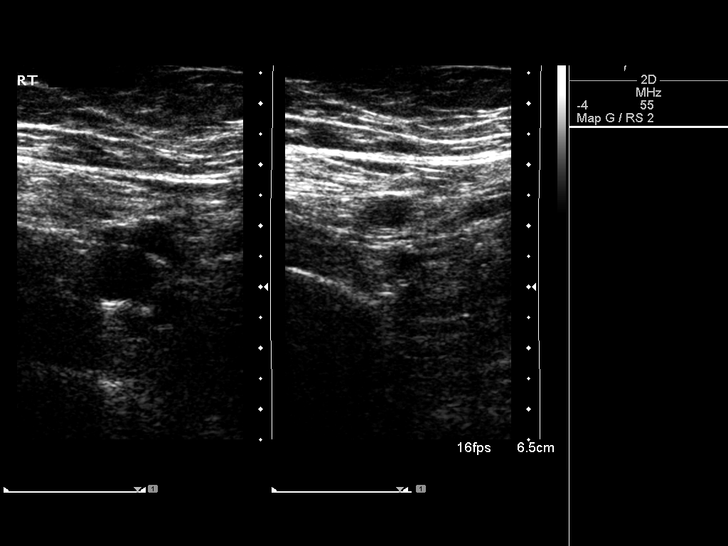
[im 5/28]
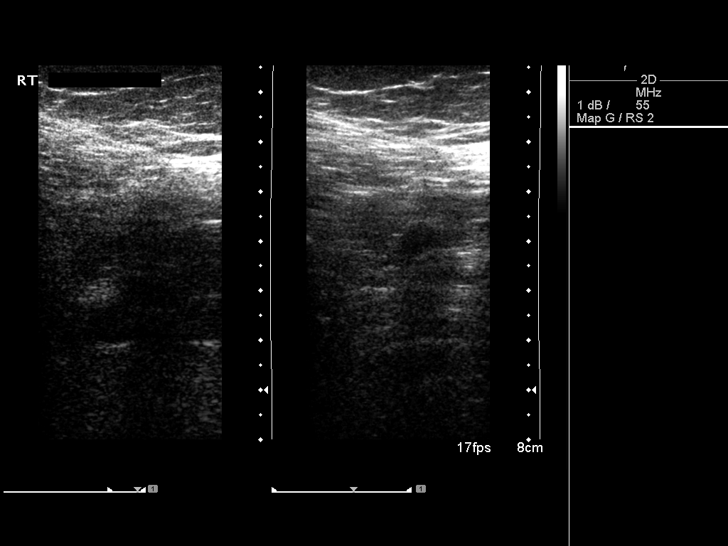
[im 8/28]
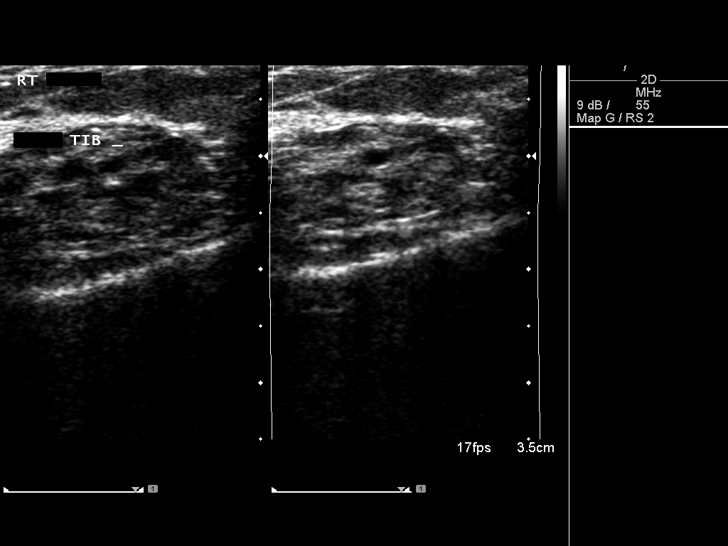
[im 9/28]
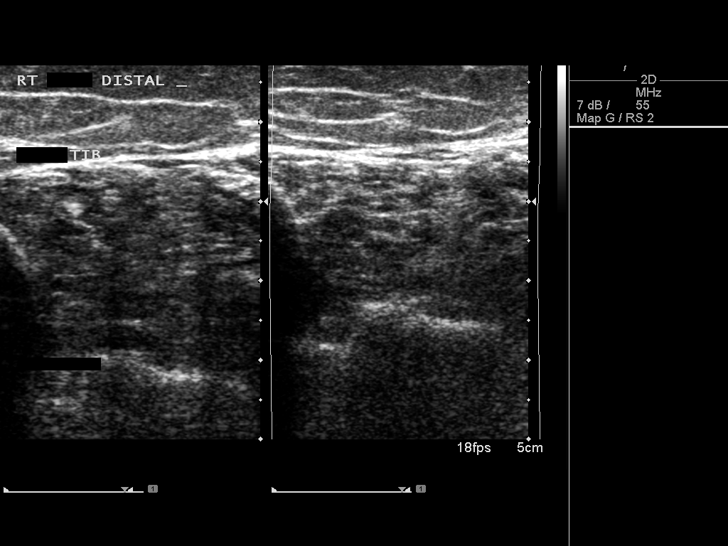
[im 11/28]
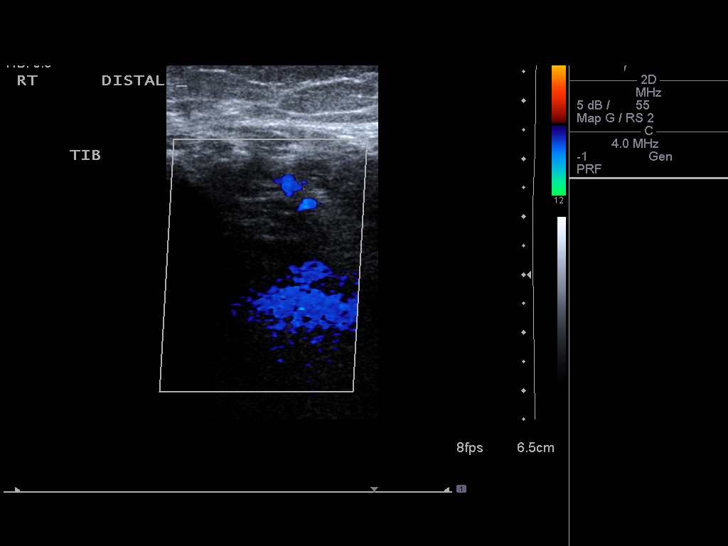
[im 13/28]
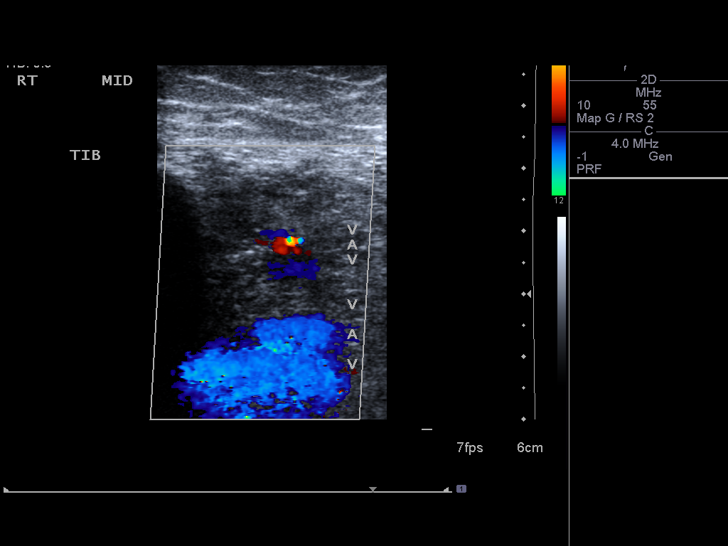
[im 15/28]
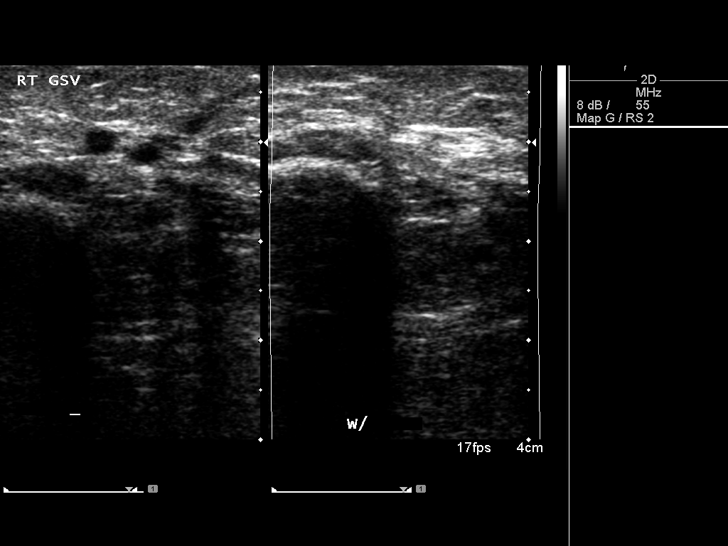
[im 17/28]
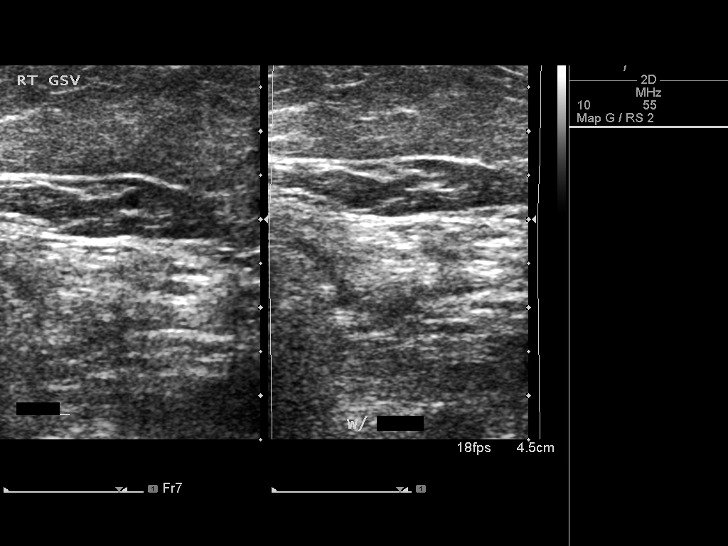
[im 19/28]
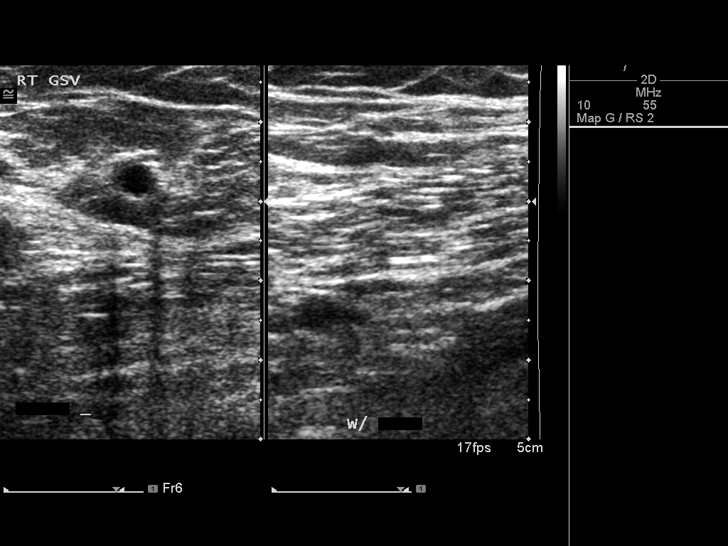
[im 22/28]
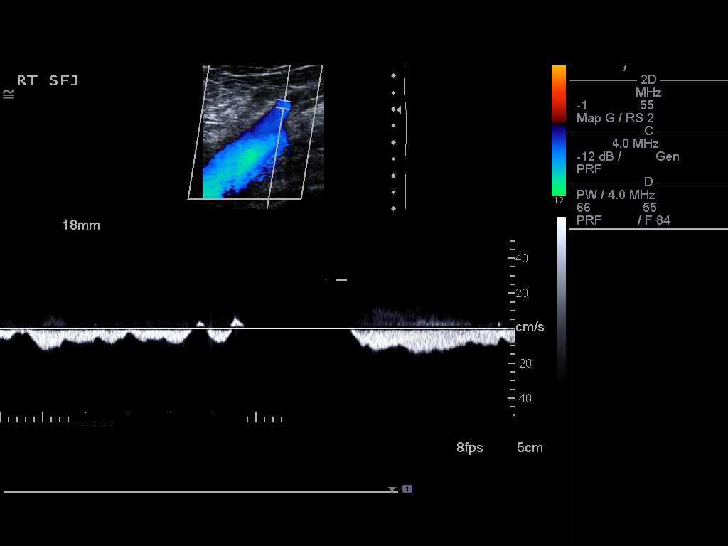
[im 23/28]
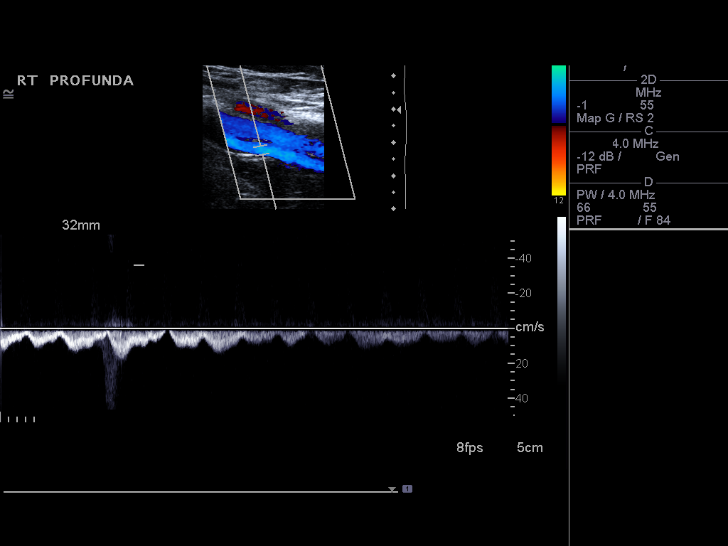
[im 25/28]
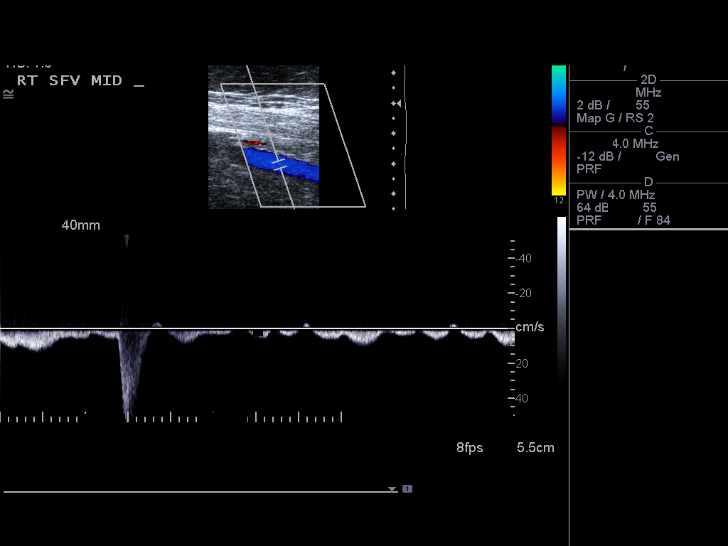
[im 28/28]
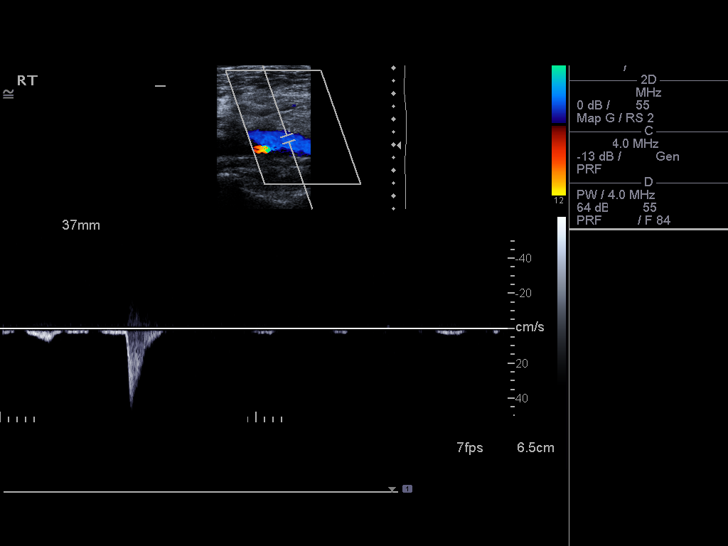

[14 of 24 positions shown; findings below may reference images not displayed]

FINDINGS: There is normal flow, compressibility and augmentation in
the right common femoral, profunda femoral, femoral and popliteal
veins.  Posterior tibial and peroneal veins are patent where
visualized.  Visualized superficial veins are patent as well.
IMPRESSION: Negative for deep venous thrombosis in the right lower extremity.

## 2013-05-16 ENCOUNTER — Ambulatory Visit (INDEPENDENT_AMBULATORY_CARE_PROVIDER_SITE_OTHER): Payer: Medicare Other | Admitting: Cardiology

## 2013-05-16 VITALS — BP 152/90 | HR 72 | Wt 188.8 lb

## 2013-05-16 DIAGNOSIS — I10 Essential (primary) hypertension: Secondary | ICD-10-CM | POA: Diagnosis not present

## 2013-05-16 NOTE — Progress Notes (Signed)
Was added on to nurse schedule for BP check per Dr. Mayford Knife due to alert in device clinic. Paula in device clinic received transmission of elevated BP and took to Dr. Mayford Knife.  She wanted pt to come in for BP check.  Reviewed meds.  She states that she takes her meds irregularly.  She took Eliquis at 1 am because she forgot.  Wants to get back on a schedule.  Per Dr. Mayford Knife advised her to take all her meds in AM.  Pt states she takes her thyroid med about 8:00 in the mornings.  Advised her to take all her meds then so she doesn't forget during the day.  Also per Dr. Mayford Knife wants her to take her BP every day x 1 wk and call back with results.  No change in meds today.  Pt verbalizes understanding and will call will BP readings.

## 2013-05-29 ENCOUNTER — Telehealth: Payer: Self-pay | Admitting: Cardiology

## 2013-05-29 NOTE — Telephone Encounter (Signed)
-  BP well controlled -continue current meds 

## 2013-05-29 NOTE — Telephone Encounter (Signed)
New Problem:  Pt states she is calling in with her BPs...  10/20 @ 11:00 116/56 10/21 @ 9pm  148/78 10/22 @ 12:30pm 117/76 10/23 @ 3pm  140/83 10/24 @ 8 pm 117/71 10/25 @ 11 am 134/68 10/26 @ 7 pm 109/80

## 2013-05-29 NOTE — Telephone Encounter (Signed)
BP well controlled - no continue current medical therapy

## 2013-05-29 NOTE — Telephone Encounter (Signed)
To Dr Turner to advise 

## 2013-05-30 NOTE — Telephone Encounter (Signed)
Spoke with pt to make aware 

## 2013-06-07 ENCOUNTER — Other Ambulatory Visit: Payer: Self-pay

## 2013-06-29 ENCOUNTER — Encounter: Payer: Self-pay | Admitting: Internal Medicine

## 2013-06-29 ENCOUNTER — Ambulatory Visit (INDEPENDENT_AMBULATORY_CARE_PROVIDER_SITE_OTHER): Payer: Medicare Other | Admitting: Internal Medicine

## 2013-06-29 VITALS — BP 154/90 | HR 78 | Ht 63.0 in | Wt 180.0 lb

## 2013-06-29 DIAGNOSIS — I495 Sick sinus syndrome: Secondary | ICD-10-CM | POA: Diagnosis not present

## 2013-06-29 DIAGNOSIS — I4891 Unspecified atrial fibrillation: Secondary | ICD-10-CM

## 2013-06-29 DIAGNOSIS — I480 Paroxysmal atrial fibrillation: Secondary | ICD-10-CM

## 2013-06-29 DIAGNOSIS — Z950 Presence of cardiac pacemaker: Secondary | ICD-10-CM

## 2013-06-29 DIAGNOSIS — I4892 Unspecified atrial flutter: Secondary | ICD-10-CM | POA: Diagnosis not present

## 2013-06-29 DIAGNOSIS — I119 Hypertensive heart disease without heart failure: Secondary | ICD-10-CM

## 2013-06-29 LAB — CBC WITH DIFFERENTIAL/PLATELET
Basophils Absolute: 0.1 10*3/uL (ref 0.0–0.1)
Eosinophils Absolute: 0.3 10*3/uL (ref 0.0–0.7)
HCT: 41.2 % (ref 36.0–46.0)
Hemoglobin: 13.9 g/dL (ref 12.0–15.0)
Lymphocytes Relative: 43.9 % (ref 12.0–46.0)
MCHC: 33.9 g/dL (ref 30.0–36.0)
Monocytes Absolute: 0.6 10*3/uL (ref 0.1–1.0)
Monocytes Relative: 8.6 % (ref 3.0–12.0)
Neutro Abs: 3.1 10*3/uL (ref 1.4–7.7)
Neutrophils Relative %: 42.2 % — ABNORMAL LOW (ref 43.0–77.0)
Platelets: 300 10*3/uL (ref 150.0–400.0)
RDW: 14.5 % (ref 11.5–14.6)

## 2013-06-29 LAB — MDC_IDC_ENUM_SESS_TYPE_INCLINIC
Battery Remaining Longevity: 112.8 mo
Battery Voltage: 2.96 V
Brady Statistic RA Percent Paced: 23 %
Brady Statistic RV Percent Paced: 0.8 %
Date Time Interrogation Session: 20141128151710
Implantable Pulse Generator Model: 2210
Lead Channel Impedance Value: 450 Ohm
Lead Channel Pacing Threshold Amplitude: 0.75 V
Lead Channel Pacing Threshold Pulse Width: 0.5 ms
Lead Channel Pacing Threshold Pulse Width: 0.5 ms
Lead Channel Sensing Intrinsic Amplitude: 12 mV
Lead Channel Sensing Intrinsic Amplitude: 3 mV
Lead Channel Setting Pacing Amplitude: 2.5 V

## 2013-06-29 LAB — BASIC METABOLIC PANEL
Calcium: 9.7 mg/dL (ref 8.4–10.5)
Creatinine, Ser: 0.9 mg/dL (ref 0.4–1.2)
GFR: 64.85 mL/min (ref >60.00)
Glucose, Bld: 113 mg/dL — ABNORMAL HIGH (ref 70–99)
Potassium: 3.6 mEq/L (ref 3.5–5.1)
Sodium: 135 mEq/L (ref 135–145)

## 2013-06-29 NOTE — Patient Instructions (Addendum)
Your physician recommends that you return for lab work today: BMET/CBCD   Remote monitoring is used to monitor your Pacemaker of ICD from home. This monitoring reduces the number of office visits required to check your device to one time per year. It allows [place] to keep an eye on the functioning of your device to ensure it is working properly. You are scheduled for a device check from home on 10/01/2013. You may send your transmission at any time that day. If you have a wireless device, the transmission will be sent automatically. After your physician reviews your transmission, you will receive a postcard with your next transmission date.  Your physician wants you to follow-up in: one year with Dr. Johney Frame.  You will receive a reminder letter in the mail two months in advance. If you don't receive a letter, please call our office to schedule the follow-up appointment.

## 2013-06-29 NOTE — Progress Notes (Signed)
PCP: Gretel Acre, MD Primary Cardiologist:  Dr [name] is a 67 y.o. female who presents today for routine electrophysiology followup.  She is doing well today.  She has rare episodes of afib but feels that her quality of life is much improved post ablation. Today, she denies symptoms of palpitations, chest pain, shortness of breath,  lower extremity edema, dizziness, presyncope, or syncope.  She has occasional cramps at night.   The patient is otherwise without complaint today.   Past Medical History  Diagnosis Date  . HTN (hypertension)   . Hypercholesteremia   . GERD (gastroesophageal reflux disease)   . Hypothyroidism   . Anemia   . Blood transfusion ~ 1962    " couple; no reaction " (05/30/2012)  . H/O hiatal hernia   . Arthritis   . Coronary artery disease     pci OF om  . Pacemaker 10/2011  . PAF (paroxysmal atrial fibrillation)   . Tachy-brady syndrome     a. post-termination pauses in setting of PAF;  b. 10/28/11 - St. Jude Dual Chamber PPM SN [id number]   . Myocardial infarction     "very very light" (05/30/2012)  . Ureteral obstruction, right   . Depression    Past Surgical History  Procedure Laterality Date  . Tee without cardioversion  05/29/2012    Procedure: TRANSESOPHAGEAL ECHOCARDIOGRAM (TEE);  Surgeon: Quintella Reichert, MD;  Location: [facility] ENDOSCOPY;  Service: Cardiovascular;  Laterality: N/A;  h/p in file drawer/dl  . Atrial fibrillation ablation  05/30/2012    PVI and CTi ablation by Dr Johney Frame  . Breast surgery  1990's    "right radial scar" (05/30/2012)  . Insert / replace / remove pacemaker  10/28/2011    initial placement  . Coronary angioplasty  ~ 04/2012  . Cystoscopy with retrograde pyelogram, ureteroscopy and stent placement  08/21/2012    Procedure: CYSTOSCOPY WITH RETROGRADE PYELOGRAM, URETEROSCOPY AND STENT PLACEMENT;  Surgeon: Marcine Matar, MD;  Location: [facility] ORS;  Service: Urology;  Laterality: Right;  URETEROSCOPY WITH BIOPSY      Current Outpatient Prescriptions  Medication Sig Dispense Refill  . ALPRAZolam (XANAX) 0.5 MG tablet Take 0.5 mg by mouth as needed.       Marland Kitchen apixaban (ELIQUIS) 5 MG TABS tablet Take 5 mg by mouth 2 (two) times daily.      . CRESTOR 10 MG tablet Take 10 mg by mouth every evening.       . famotidine (PEPCID) 20 MG tablet Take 20 mg by mouth as needed.      Marland Kitchen levothyroxine (SYNTHROID, LEVOTHROID) 100 MCG tablet Take 100 mcg by mouth daily before breakfast.       . lisinopril (PRINIVIL,ZESTRIL) 10 MG tablet Take 10 mg by mouth daily.       . metoprolol succinate (TOPROL-XL) 50 MG 24 hr tablet Take 1 tablet (50 mg total) by mouth daily before lunch.  30 tablet  4  . nitroGLYCERIN (NITROSTAT) 0.4 MG SL tablet Place 1 tablet (0.4 mg total) under the tongue every 5 (five) minutes x 3 doses as needed for chest pain.  25 tablet  5   No current facility-administered medications for this visit.    Physical Exam: Filed Vitals:   06/29/13 1435  BP: 154/90  Pulse: 78  Height: 5\' 3"  (1.6 m)  Weight: 180 lb (81.647 kg)    GEN- The patient is anxious appearing, alert and oriented x 3 today.   Head- normocephalic, atraumatic Eyes-  Sclera clear, conjunctiva pink Ears- hearing intact Oropharynx- clear Lungs- Clear to ausculation bilaterally, normal work of breathing Heart- Regular rate and rhythm, no murmurs, rubs or gallops, PMI not laterally displaced GI- soft, NT, ND, + BS Extremities- no clubbing, cyanosis, or edema  Pacemaker interrogation today is reviewed, see paceart  Assessment and Plan:  1. afib Well conrolled off of AAD therapy Overall afib burden is 2.5% She is pleased with the results of her ablation Continue eliquis Check CBC and BMET today  2. Tachy/brady syndrome Normal pacemaker function See Pace Art report No changes today  3. HTN Above goal today but she reports good bp control at home.  BMET today Consider increasing lisinopril if not improved  4.  Cramps Possibly due to crestor bmet today Return to follow-up with Dr Mayford Knife as scheduled  Merlin Return to see me in 1 year She will contact me if her afib burden increases in the interim.

## 2013-07-12 ENCOUNTER — Encounter: Payer: Self-pay | Admitting: Cardiology

## 2013-07-12 ENCOUNTER — Ambulatory Visit (INDEPENDENT_AMBULATORY_CARE_PROVIDER_SITE_OTHER): Payer: Medicare Other | Admitting: Cardiology

## 2013-07-12 VITALS — BP 114/74 | HR 76 | Ht 63.0 in | Wt 189.0 lb

## 2013-07-12 DIAGNOSIS — Z23 Encounter for immunization: Secondary | ICD-10-CM

## 2013-07-12 DIAGNOSIS — I10 Essential (primary) hypertension: Secondary | ICD-10-CM | POA: Insufficient documentation

## 2013-07-12 DIAGNOSIS — Z950 Presence of cardiac pacemaker: Secondary | ICD-10-CM | POA: Diagnosis not present

## 2013-07-12 DIAGNOSIS — E7800 Pure hypercholesterolemia, unspecified: Secondary | ICD-10-CM

## 2013-07-12 DIAGNOSIS — I480 Paroxysmal atrial fibrillation: Secondary | ICD-10-CM

## 2013-07-12 DIAGNOSIS — I4891 Unspecified atrial fibrillation: Secondary | ICD-10-CM

## 2013-07-12 DIAGNOSIS — I2510 Atherosclerotic heart disease of native coronary artery without angina pectoris: Secondary | ICD-10-CM | POA: Diagnosis not present

## 2013-07-12 DIAGNOSIS — Z0000 Encounter for general adult medical examination without abnormal findings: Secondary | ICD-10-CM

## 2013-07-12 DIAGNOSIS — I495 Sick sinus syndrome: Secondary | ICD-10-CM

## 2013-07-12 MED ORDER — APIXABAN 5 MG PO TABS: 5.0000 mg | ORAL_TABLET | Freq: Two times a day (BID) | ORAL | Status: DC

## 2013-07-12 NOTE — Addendum Note (Signed)
Addended by: Kem Parkinson on: 07/12/2013 12:15 PM   Modules accepted: Orders

## 2013-07-12 NOTE — Patient Instructions (Signed)
Your physician recommends that you continue on your current medications as directed. Please refer to the Current Medication list given to you today.  Your physician recommends that you return for lab work on 12/17 for Fasting Lipid and ALT Panel  You received your Flu shot today 07/11/13  Your physician wants you to follow-up in: 6 Months with Dr Sherlyn Lick will receive a reminder letter in the mail two months in advance. If you don't receive a letter, please call our office to schedule the follow-up appointment.

## 2013-07-12 NOTE — Progress Notes (Signed)
[address] [place], [place]  [address] Phone: [phone] Fax:  [phone]  Date:  07/12/2013   ID:  Cheryl [name], DOB 1946-07-05, MRN [id number]  PCP:  Gretel Acre, MD  Cardiologist:  Armanda Magic, MD     History of Present Illness: Cheryl [name] is a 67 y.o. female with a history of PAF s/p ablation a year ago, HTN, ASCAD and dyslipidemia who presents today for followup.  She is doing well.  SHh denies any chest pain,  LE edema, dizziness or syncope.  She has chronic DOE with exertion when it is real cold and is very stable and actually improved.  She saw Dr. Johney Frame a few weeks ago and said she was having occasional breakthrough of PAF at night which is fairly short lived and at this time she is to continue on current medicine per Dr. Johney Frame.   Wt Readings from Last 3 Encounters:  07/12/13 189 lb (85.73 kg)  06/29/13 180 lb (81.647 kg)  05/16/13 188 lb 12.8 oz (85.639 kg)     Past Medical History  Diagnosis Date  . Hypercholesteremia   . GERD (gastroesophageal reflux disease)   . Hypothyroidism   . Anemia   . Blood transfusion ~ 1962    " couple; no reaction " (05/30/2012)  . H/O hiatal hernia   . Arthritis   . Pacemaker 10/2011  . Myocardial infarction     "very very light" (05/30/2012)  . Ureteral obstruction, right   . Depression   . Coronary artery disease 2013    PCI PCI of OM  . PAF (paroxysmal atrial fibrillation) 05/30/2012    s/p afib/flutter ablation  . Tachy-brady syndrome 10/2011    a. post-termination pauses in setting of PAF;  b. 10/28/11 - St. Jude Dual Chamber PPM SN [id number]   . HTN (hypertension)     Current Outpatient Prescriptions  Medication Sig Dispense Refill  . ALPRAZolam (XANAX) 0.5 MG tablet Take 0.5 mg by mouth as needed.       Marland Kitchen apixaban (ELIQUIS) 5 MG TABS tablet Take 5 mg by mouth 2 (two) times daily.      . CRESTOR 10 MG tablet Take 10 mg by mouth every evening.       . famotidine (PEPCID) 20 MG tablet Take 20 mg by  mouth as needed.      Marland Kitchen levothyroxine (SYNTHROID, LEVOTHROID) 100 MCG tablet Take 100 mcg by mouth daily before breakfast.       . lisinopril (PRINIVIL,ZESTRIL) 10 MG tablet Take 10 mg by mouth daily.       . metoprolol succinate (TOPROL-XL) 50 MG 24 hr tablet Take 1 tablet (50 mg total) by mouth daily before lunch.  30 tablet  4  . nitroGLYCERIN (NITROSTAT) 0.4 MG SL tablet Place 1 tablet (0.4 mg total) under the tongue every 5 (five) minutes x 3 doses as needed for chest pain.  25 tablet  5   No current facility-administered medications for this visit.    Allergies:    Allergies  Allergen Reactions  . Latex Itching  . Protonix [Pantoprazole Sodium] Other (See Comments)    "think it caused me to have a fib"  . Codeine Nausea And Vomiting  . Tape Rash    "Paper tape is ok"    Social History:  The patient  reports that she quit smoking about 36 years ago. Her smoking use included Cigarettes. She has a 1.8 pack-year smoking history. She has never  used smokeless tobacco. She reports that she does not drink alcohol or use illicit drugs.   Family History:  The patient's family history includes Cancer in her mother; Heart attack (age of onset: 12) in her father; Heart attack (age of onset: 29) in her mother.   ROS:  Please see the history of present illness.      All other systems reviewed and negative.   PHYSICAL EXAM: VS:  BP 114/74  Pulse 76  Ht 5\' 3"  (1.6 m)  Wt 189 lb (85.73 kg)  BMI 33.49 kg/m2 Well nourished, well developed, in no acute distress HEENT: normal Neck: no JVD Cardiac:  normal S1, S2; RRR; no murmur Lungs:  clear to auscultation bilaterally, no wheezing, rhonchi or rales Abd: soft, nontender, no hepatomegaly Ext: no edema Skin: warm and dry Neuro:  CNs 2-12 intact, no focal abnormalities noted    ASSESSMENT AND PLAN:  1. ASCAD with no angina 2. HTN - well controlled  - continue Metoprolol/lisinopril 3. Dyslipidemia - last LDL was 80 and her goal with CAD  and HTN is less than 70  - continue Crestor  - recheck lipids and if LDL still above 70 will increase Crestor dose 4. PAF maintaining NSR  - continue Apixiban/metoprolol  Followup with me in 6 months  Signed, Armanda Magic, MD 07/12/2013 10:56 AM

## 2013-07-18 ENCOUNTER — Other Ambulatory Visit (INDEPENDENT_AMBULATORY_CARE_PROVIDER_SITE_OTHER): Payer: Medicare Other

## 2013-07-18 ENCOUNTER — Telehealth: Payer: Self-pay | Admitting: Cardiology

## 2013-07-18 DIAGNOSIS — E7800 Pure hypercholesterolemia, unspecified: Secondary | ICD-10-CM | POA: Diagnosis not present

## 2013-07-18 LAB — LIPID PANEL
Cholesterol: 169 mg/dL (ref 0–200)
LDL Cholesterol: 97 mg/dL (ref 0–99)
Triglycerides: 106 mg/dL (ref 0.0–149.0)

## 2013-07-18 LAB — ALT: ALT: 18 U/L (ref 0–35)

## 2013-07-18 NOTE — Telephone Encounter (Signed)
Walk In pt Form " Disability Pla-Card" Dropped Off gave to Danielle C  °

## 2013-07-23 NOTE — Progress Notes (Signed)
Pt states the crestor at the current dosage already makes her feel like crude. She wants to know if there is anything else she can do before upping the crestor dosage.

## 2013-07-24 ENCOUNTER — Encounter: Payer: Self-pay | Admitting: General Surgery

## 2013-07-24 ENCOUNTER — Other Ambulatory Visit: Payer: Self-pay | Admitting: *Deleted

## 2013-07-24 ENCOUNTER — Other Ambulatory Visit: Payer: Self-pay | Admitting: General Surgery

## 2013-07-24 DIAGNOSIS — E7800 Pure hypercholesterolemia, unspecified: Secondary | ICD-10-CM

## 2013-07-24 DIAGNOSIS — I480 Paroxysmal atrial fibrillation: Secondary | ICD-10-CM

## 2013-07-24 MED ORDER — PSYLLIUM 28.3 % PO POWD: ORAL | Status: DC

## 2013-07-24 MED ORDER — METOPROLOL SUCCINATE ER 50 MG PO TB24: 50.0000 mg | ORAL_TABLET | Freq: Every day | ORAL | Status: DC

## 2013-07-27 ENCOUNTER — Encounter: Payer: Self-pay | Admitting: Internal Medicine

## 2013-07-31 ENCOUNTER — Other Ambulatory Visit: Payer: Self-pay

## 2013-07-31 MED ORDER — NITROGLYCERIN 0.4 MG SL SUBL: 0.4000 mg | SUBLINGUAL_TABLET | SUBLINGUAL | Status: DC | PRN

## 2013-08-21 ENCOUNTER — Encounter: Payer: Self-pay | Admitting: Internal Medicine

## 2013-09-13 ENCOUNTER — Other Ambulatory Visit (HOSPITAL_COMMUNITY)
Admission: RE | Admit: 2013-09-13 | Discharge: 2013-09-13 | Disposition: A | Discharge status: Home / Self Care | Payer: Medicare Other | Source: Ambulatory Visit | Attending: Family Medicine | Admitting: Family Medicine

## 2013-09-13 ENCOUNTER — Other Ambulatory Visit: Payer: Self-pay | Admitting: Family Medicine

## 2013-09-13 DIAGNOSIS — Z124 Encounter for screening for malignant neoplasm of cervix: Secondary | ICD-10-CM | POA: Diagnosis not present

## 2013-09-13 DIAGNOSIS — N760 Acute vaginitis: Secondary | ICD-10-CM | POA: Diagnosis not present

## 2013-09-13 DIAGNOSIS — R82998 Other abnormal findings in urine: Secondary | ICD-10-CM | POA: Diagnosis not present

## 2013-09-13 DIAGNOSIS — Z23 Encounter for immunization: Secondary | ICD-10-CM | POA: Diagnosis not present

## 2013-09-13 DIAGNOSIS — Z0000 Encounter for general adult medical examination without abnormal findings: Secondary | ICD-10-CM | POA: Diagnosis not present

## 2013-09-13 DIAGNOSIS — Z1151 Encounter for screening for human papillomavirus (HPV): Secondary | ICD-10-CM | POA: Insufficient documentation

## 2013-09-13 DIAGNOSIS — Z131 Encounter for screening for diabetes mellitus: Secondary | ICD-10-CM | POA: Diagnosis not present

## 2013-09-13 DIAGNOSIS — N899 Noninflammatory disorder of vagina, unspecified: Secondary | ICD-10-CM | POA: Diagnosis not present

## 2013-09-13 DIAGNOSIS — E785 Hyperlipidemia, unspecified: Secondary | ICD-10-CM | POA: Diagnosis not present

## 2013-09-19 ENCOUNTER — Telehealth: Payer: Self-pay | Admitting: *Deleted

## 2013-09-19 NOTE — Telephone Encounter (Signed)
Per JA increase Metoprolol 50mg  qd to Metoprolol 75mg  daily. Pt aware of med change.

## 2013-09-20 DIAGNOSIS — Z1211 Encounter for screening for malignant neoplasm of colon: Secondary | ICD-10-CM | POA: Diagnosis not present

## 2013-09-26 ENCOUNTER — Telehealth: Payer: Self-pay

## 2013-09-28 ENCOUNTER — Encounter: Payer: Self-pay | Admitting: Internal Medicine

## 2013-09-28 ENCOUNTER — Other Ambulatory Visit: Payer: Self-pay | Admitting: General Surgery

## 2013-09-28 DIAGNOSIS — I119 Hypertensive heart disease without heart failure: Secondary | ICD-10-CM

## 2013-09-28 MED ORDER — METOPROLOL SUCCINATE ER 50 MG PO TB24: 75.0000 mg | ORAL_TABLET | Freq: Every day | ORAL | Status: DC

## 2013-09-28 MED ORDER — LISINOPRIL 10 MG PO TABS: 10.0000 mg | ORAL_TABLET | Freq: Every day | ORAL | Status: DC

## 2013-09-28 NOTE — Telephone Encounter (Signed)
rx sent in for pt.  

## 2013-10-01 ENCOUNTER — Ambulatory Visit (INDEPENDENT_AMBULATORY_CARE_PROVIDER_SITE_OTHER): Payer: Medicare Other | Admitting: *Deleted

## 2013-10-01 DIAGNOSIS — I4892 Unspecified atrial flutter: Secondary | ICD-10-CM

## 2013-10-01 DIAGNOSIS — I495 Sick sinus syndrome: Secondary | ICD-10-CM

## 2013-10-08 ENCOUNTER — Encounter: Payer: Self-pay | Admitting: Internal Medicine

## 2013-10-18 ENCOUNTER — Encounter: Payer: Self-pay | Admitting: Internal Medicine

## 2013-10-18 DIAGNOSIS — R195 Other fecal abnormalities: Secondary | ICD-10-CM | POA: Diagnosis not present

## 2013-10-21 LAB — MDC_IDC_ENUM_SESS_TYPE_REMOTE
Brady Statistic AP VP Percent: 1 %
Brady Statistic AP VS Percent: 33 %
Brady Statistic AS VP Percent: 1 %
Brady Statistic AS VS Percent: 66 %
Brady Statistic RA Percent Paced: 31 %
Brady Statistic RV Percent Paced: 1 %
Lead Channel Impedance Value: 450 Ohm
Lead Channel Pacing Threshold Amplitude: 0.75 V
Lead Channel Pacing Threshold Pulse Width: 0.5 ms
Lead Channel Sensing Intrinsic Amplitude: 11.5 mV
Lead Channel Setting Pacing Amplitude: 1.75 V
Lead Channel Setting Pacing Amplitude: 2.5 V
Lead Channel Setting Sensing Sensitivity: 2 mV
MDC IDC MSMT BATTERY REMAINING LONGEVITY: 98 mo
MDC IDC MSMT BATTERY VOLTAGE: 2.96 V
MDC IDC MSMT LEADCHNL RA IMPEDANCE VALUE: 400 Ohm
MDC IDC MSMT LEADCHNL RA SENSING INTR AMPL: 5 mV
MDC IDC MSMT LEADCHNL RV PACING THRESHOLD AMPLITUDE: 1.25 V
MDC IDC MSMT LEADCHNL RV PACING THRESHOLD PULSEWIDTH: 0.5 ms
MDC IDC PG SERIAL: 7334985
MDC IDC SESS DTM: 20150309081041
MDC IDC SET LEADCHNL RV PACING PULSEWIDTH: 0.5 ms

## 2013-10-23 ENCOUNTER — Telehealth: Payer: Self-pay | Admitting: Cardiology

## 2013-10-23 NOTE — Telephone Encounter (Signed)
Received request from Nurse fax box, documents faxed for surgical clearance. To: [facility] Fax number: [phone] Attention: [phone]/[phone]

## 2013-10-23 NOTE — Telephone Encounter (Signed)
refill 

## 2013-11-06 ENCOUNTER — Encounter: Payer: Self-pay | Admitting: Cardiology

## 2013-11-06 ENCOUNTER — Ambulatory Visit (INDEPENDENT_AMBULATORY_CARE_PROVIDER_SITE_OTHER): Payer: Medicare Other | Admitting: Cardiology

## 2013-11-06 VITALS — BP 140/84 | HR 63 | Ht 63.0 in | Wt 183.0 lb

## 2013-11-06 DIAGNOSIS — I2510 Atherosclerotic heart disease of native coronary artery without angina pectoris: Secondary | ICD-10-CM | POA: Diagnosis not present

## 2013-11-06 DIAGNOSIS — Z950 Presence of cardiac pacemaker: Secondary | ICD-10-CM

## 2013-11-06 DIAGNOSIS — I480 Paroxysmal atrial fibrillation: Secondary | ICD-10-CM

## 2013-11-06 DIAGNOSIS — I4891 Unspecified atrial fibrillation: Secondary | ICD-10-CM

## 2013-11-06 DIAGNOSIS — I4892 Unspecified atrial flutter: Secondary | ICD-10-CM | POA: Diagnosis not present

## 2013-11-06 DIAGNOSIS — I10 Essential (primary) hypertension: Secondary | ICD-10-CM

## 2013-11-06 DIAGNOSIS — I495 Sick sinus syndrome: Secondary | ICD-10-CM

## 2013-11-06 DIAGNOSIS — E7800 Pure hypercholesterolemia, unspecified: Secondary | ICD-10-CM

## 2013-11-06 MED ORDER — METOPROLOL SUCCINATE ER 100 MG PO TB24: 100.0000 mg | ORAL_TABLET | Freq: Every day | ORAL | Status: DC

## 2013-11-06 NOTE — Patient Instructions (Addendum)
Your physician has recommended you make the following change in your medication: 1. Increase Metoprolol to 100 MG daily   Your physician wants you to follow-up in: 6 Months with Dr Mallie Snooks will receive a reminder letter in the mail two months in advance. If you don't receive a letter, please call our office to schedule the follow-up appointment.

## 2013-11-06 NOTE — Progress Notes (Signed)
Cheryl [name], Cheryl [name], Cheryl [name]  [address] Phone: [phone] Fax:  [phone]  Date:  11/06/2013   ID:  MARETTA [name], DOB [birth year], MRN [id number]  PCP:  Gavin Pound, MD  Cardiologist:  Fransico Him, MD     History of Present Illness: Cheryl [name] is a 68 y.o. female with a history of PAF/flutter s/p ablation a year ago, tachycardia bradycardia syndrome s/p PPM, HTN, ASCAD and dyslipidemia who presents today for followup. She is doing well. She denies any chest pain, LE edema, dizziness or syncope. She has chronic DOE with exertion when it is real cold and is very stable and actually improved. She has occasional breakthrough of PAF and she says that when she made the appointment she was having a lot of breakthrough that lasts up to 13 hours at a time.  Then is abated for about 2 weeks and then had another episode this past weekend that lasted at least 12 hours.    Wt Readings from Last 3 Encounters:  11/06/13 183 lb (83.008 kg)  07/12/13 189 lb (85.73 kg)  06/29/13 180 lb (81.647 kg)     Past Medical History  Diagnosis Date  . Hypercholesteremia   . GERD (gastroesophageal reflux disease)   . Hypothyroidism   . Anemia   . Blood transfusion ~ 1962    " couple; no reaction " (05/30/2012)  . H/O hiatal hernia   . Arthritis   . Pacemaker 10/2011  . Myocardial infarction     "very very light" (05/30/2012)  . Ureteral obstruction, right   . Depression   . Coronary artery disease 2013    PCI PCI of OM  . PAF (paroxysmal atrial fibrillation) 05/30/2012    s/p afib/flutter ablation  . Tachy-brady syndrome 10/2011    a. post-termination pauses in setting of PAF;  b. 10/28/11 - St. Jude Dual Chamber PPM SN [id number]   . HTN (hypertension)     Current Outpatient Prescriptions  Medication Sig Dispense Refill  . ALPRAZolam (XANAX) 0.5 MG tablet Take 0.5 mg by mouth as needed.       Marland Kitchen apixaban (ELIQUIS) 5 MG TABS tablet Take 1 tablet (5 mg total) by mouth 2 (two) times  daily.  60 tablet  11  . CRESTOR 10 MG tablet Take 10 mg by mouth every evening.       . famotidine (PEPCID) 20 MG tablet Take 20 mg by mouth as needed.      Marland Kitchen levothyroxine (SYNTHROID, LEVOTHROID) 100 MCG tablet Take 100 mcg by mouth daily before breakfast.       . lisinopril (PRINIVIL,ZESTRIL) 10 MG tablet Take 1 tablet (10 mg total) by mouth daily.  30 tablet  11  . metoprolol succinate (TOPROL-XL) 50 MG 24 hr tablet Take 75 mg by mouth daily. Take with or immediately following a meal.      . nitroGLYCERIN (NITROSTAT) 0.4 MG SL tablet Place 1 tablet (0.4 mg total) under the tongue every 5 (five) minutes x 3 doses as needed for chest pain.  25 tablet  5  . Psyllium (METAMUCIL) 28.3 % POWD ! scopp with 8 oz of water or juice BID       No current facility-administered medications for this visit.    Allergies:    Allergies  Allergen Reactions  . Latex Itching  . Protonix [Pantoprazole Sodium] Other (See Comments)    "think it caused me to have a fib"  . Codeine Nausea And Vomiting  .  Tape Rash    "Paper tape is ok"    Social History:  The patient  reports that she quit smoking about 36 years ago. Her smoking use included Cigarettes. She has a 1.8 pack-year smoking history. She has never used smokeless tobacco. She reports that she does not drink alcohol or use illicit drugs.   Family History:  The patient's family history includes Cancer in her mother; Heart attack (age of onset: 82) in her father; Heart attack (age of onset: 25) in her mother.   ROS:  Please see the history of present illness.      All other systems reviewed and negative.   PHYSICAL EXAM: VS:  BP 140/84  Pulse 63  Ht 5\' 3"  (1.6 m)  Wt 183 lb (83.008 kg)  BMI 32.43 kg/m2 Well nourished, well developed, in no acute distress HEENT: normal Neck: no JVD Cardiac:  normal S1, S2; RRR; no murmur Lungs:  clear to auscultation bilaterally, no wheezing, rhonchi or rales Abd: soft, nontender, no hepatomegaly Ext: no  edema Skin: warm and dry Neuro:  CNs 2-12 intact, no focal abnormalities noted  EKG:NSR at 63bpm with nonspecific ST abnormality  ASSESSMENT AND PLAN:  1.  ASCAD with no angina 2.  HTN - well controlled - continue Metoprolol/lisinopril  3.  Dyslipidemia - last LDL was 74 so near her goal with CAD and HTN of less than 70 - continue Crestor  4.  PAF maintaining NSR but is having more frequent episodes that last longer.   - continue Apixiban/metoprolol  - increase Toprol XL to 100mg  daily.  If she continues to have breakthrough we may need to refer back to EP to consider antiarrhythmic therapy vs. Repeat ablation.  She does not want to take Flecainide. She will let me know if she has any further palpitations.   5.  Tachybrady syndrome s/p PPM  Followup with me in 6 months    Signed, Fransico Him, MD 11/06/2013 4:21 PM

## 2013-11-19 ENCOUNTER — Encounter: Payer: Self-pay | Admitting: Internal Medicine

## 2013-11-22 ENCOUNTER — Other Ambulatory Visit: Payer: Medicare Other

## 2013-11-23 DIAGNOSIS — H6590 Unspecified nonsuppurative otitis media, unspecified ear: Secondary | ICD-10-CM | POA: Diagnosis not present

## 2013-11-23 DIAGNOSIS — H6120 Impacted cerumen, unspecified ear: Secondary | ICD-10-CM | POA: Diagnosis not present

## 2013-12-09 ENCOUNTER — Encounter: Payer: Self-pay | Admitting: Internal Medicine

## 2014-01-07 ENCOUNTER — Ambulatory Visit (INDEPENDENT_AMBULATORY_CARE_PROVIDER_SITE_OTHER): Payer: Medicare Other | Admitting: *Deleted

## 2014-01-07 DIAGNOSIS — I480 Paroxysmal atrial fibrillation: Secondary | ICD-10-CM

## 2014-01-07 DIAGNOSIS — I495 Sick sinus syndrome: Secondary | ICD-10-CM | POA: Diagnosis not present

## 2014-01-07 DIAGNOSIS — I4891 Unspecified atrial fibrillation: Secondary | ICD-10-CM | POA: Diagnosis not present

## 2014-01-07 NOTE — Progress Notes (Signed)
Remote pacemaker transmission.   

## 2014-01-11 LAB — MDC_IDC_ENUM_SESS_TYPE_REMOTE
Battery Remaining Longevity: 96 mo
Battery Remaining Percentage: 80 %
Battery Voltage: 2.96 V
Brady Statistic AP VP Percent: 1 %
Brady Statistic AP VS Percent: 40 %
Brady Statistic AS VS Percent: 59 %
Brady Statistic RV Percent Paced: 1.1 %
Date Time Interrogation Session: 20150608063908
Implantable Pulse Generator Model: 2210
Lead Channel Impedance Value: 430 Ohm
Lead Channel Pacing Threshold Amplitude: 0.75 V
Lead Channel Pacing Threshold Amplitude: 1.25 V
Lead Channel Pacing Threshold Pulse Width: 0.5 ms
Lead Channel Pacing Threshold Pulse Width: 0.5 ms
Lead Channel Sensing Intrinsic Amplitude: 5 mV
Lead Channel Sensing Intrinsic Amplitude: 8.2 mV
Lead Channel Setting Pacing Amplitude: 1.75 V
Lead Channel Setting Pacing Pulse Width: 0.5 ms
MDC IDC MSMT LEADCHNL RV IMPEDANCE VALUE: 410 Ohm
MDC IDC PG SERIAL: 7334985
MDC IDC SET LEADCHNL RV PACING AMPLITUDE: 2.5 V
MDC IDC SET LEADCHNL RV SENSING SENSITIVITY: 2 mV
MDC IDC STAT BRADY AS VP PERCENT: 1 %
MDC IDC STAT BRADY RA PERCENT PACED: 38 %

## 2014-01-29 ENCOUNTER — Encounter: Payer: Self-pay | Admitting: Cardiology

## 2014-02-13 ENCOUNTER — Encounter: Payer: Self-pay | Admitting: Internal Medicine

## 2014-02-22 ENCOUNTER — Encounter: Payer: Self-pay | Admitting: Internal Medicine

## 2014-03-13 DIAGNOSIS — I10 Essential (primary) hypertension: Secondary | ICD-10-CM | POA: Diagnosis not present

## 2014-03-13 DIAGNOSIS — E785 Hyperlipidemia, unspecified: Secondary | ICD-10-CM | POA: Diagnosis not present

## 2014-03-13 DIAGNOSIS — E039 Hypothyroidism, unspecified: Secondary | ICD-10-CM | POA: Diagnosis not present

## 2014-04-02 ENCOUNTER — Encounter: Payer: Self-pay | Admitting: Internal Medicine

## 2014-04-03 DIAGNOSIS — L259 Unspecified contact dermatitis, unspecified cause: Secondary | ICD-10-CM | POA: Diagnosis not present

## 2014-04-10 ENCOUNTER — Ambulatory Visit (INDEPENDENT_AMBULATORY_CARE_PROVIDER_SITE_OTHER): Payer: Medicare Other | Admitting: *Deleted

## 2014-04-10 DIAGNOSIS — I495 Sick sinus syndrome: Secondary | ICD-10-CM

## 2014-04-10 LAB — MDC_IDC_ENUM_SESS_TYPE_REMOTE
Brady Statistic AP VS Percent: 44 %
Brady Statistic AS VP Percent: 1 %
Brady Statistic AS VS Percent: 55 %
Brady Statistic RA Percent Paced: 41 %
Implantable Pulse Generator Serial Number: 7334985
Lead Channel Impedance Value: 400 Ohm
Lead Channel Pacing Threshold Amplitude: 0.625 V
Lead Channel Pacing Threshold Amplitude: 1.25 V
Lead Channel Pacing Threshold Pulse Width: 0.5 ms
Lead Channel Sensing Intrinsic Amplitude: 9.5 mV
Lead Channel Setting Pacing Amplitude: 1.625
Lead Channel Setting Pacing Amplitude: 2.5 V
Lead Channel Setting Pacing Pulse Width: 0.5 ms
Lead Channel Setting Sensing Sensitivity: 2 mV
MDC IDC MSMT BATTERY REMAINING LONGEVITY: 93 mo
MDC IDC MSMT BATTERY REMAINING PERCENTAGE: 79 %
MDC IDC MSMT BATTERY VOLTAGE: 2.96 V
MDC IDC MSMT LEADCHNL RA IMPEDANCE VALUE: 380 Ohm
MDC IDC MSMT LEADCHNL RA PACING THRESHOLD PULSEWIDTH: 0.5 ms
MDC IDC MSMT LEADCHNL RA SENSING INTR AMPL: 5 mV
MDC IDC SESS DTM: 20150909071402
MDC IDC STAT BRADY AP VP PERCENT: 1 %
MDC IDC STAT BRADY RV PERCENT PACED: 1.1 %

## 2014-04-10 NOTE — Progress Notes (Signed)
Remote pacemaker transmission.   

## 2014-04-15 ENCOUNTER — Encounter: Payer: Self-pay | Admitting: Cardiology

## 2014-04-16 ENCOUNTER — Encounter: Payer: Self-pay | Admitting: Internal Medicine

## 2014-04-17 ENCOUNTER — Encounter: Payer: Self-pay | Admitting: Cardiology

## 2014-05-06 NOTE — Progress Notes (Signed)
This encounter was created in error - please disregard.

## 2014-05-07 ENCOUNTER — Encounter: Payer: Medicare Other | Admitting: Cardiology

## 2014-05-15 ENCOUNTER — Other Ambulatory Visit: Payer: Self-pay

## 2014-05-15 MED ORDER — METOPROLOL SUCCINATE ER 100 MG PO TB24: 100.0000 mg | ORAL_TABLET | Freq: Every day | ORAL | Status: DC

## 2014-05-27 ENCOUNTER — Other Ambulatory Visit: Payer: Self-pay | Admitting: Gastroenterology

## 2014-05-27 DIAGNOSIS — D123 Benign neoplasm of transverse colon: Secondary | ICD-10-CM | POA: Diagnosis not present

## 2014-05-27 DIAGNOSIS — R195 Other fecal abnormalities: Secondary | ICD-10-CM | POA: Diagnosis not present

## 2014-05-28 ENCOUNTER — Encounter: Payer: Self-pay | Admitting: Internal Medicine

## 2014-06-05 ENCOUNTER — Encounter: Payer: Self-pay | Admitting: Cardiology

## 2014-07-09 ENCOUNTER — Encounter: Payer: Self-pay | Admitting: Cardiology

## 2014-07-09 ENCOUNTER — Ambulatory Visit (INDEPENDENT_AMBULATORY_CARE_PROVIDER_SITE_OTHER): Payer: Medicare Other | Admitting: Cardiology

## 2014-07-09 VITALS — BP 124/78 | HR 69 | Ht 63.0 in | Wt 181.0 lb

## 2014-07-09 DIAGNOSIS — I495 Sick sinus syndrome: Secondary | ICD-10-CM

## 2014-07-09 DIAGNOSIS — I480 Paroxysmal atrial fibrillation: Secondary | ICD-10-CM | POA: Diagnosis not present

## 2014-07-09 DIAGNOSIS — I2510 Atherosclerotic heart disease of native coronary artery without angina pectoris: Secondary | ICD-10-CM

## 2014-07-09 DIAGNOSIS — Z950 Presence of cardiac pacemaker: Secondary | ICD-10-CM | POA: Diagnosis not present

## 2014-07-09 DIAGNOSIS — I10 Essential (primary) hypertension: Secondary | ICD-10-CM | POA: Diagnosis not present

## 2014-07-09 DIAGNOSIS — I2583 Coronary atherosclerosis due to lipid rich plaque: Secondary | ICD-10-CM

## 2014-07-09 LAB — BASIC METABOLIC PANEL
BUN: 16 mg/dL (ref 6–23)
CALCIUM: 9.1 mg/dL (ref 8.4–10.5)
CO2: 27 mEq/L (ref 19–32)
CREATININE: 0.9 mg/dL (ref 0.4–1.2)
Chloride: 102 mEq/L (ref 96–112)
GFR: 63.07 mL/min (ref >60.00)
Glucose, Bld: 112 mg/dL — ABNORMAL HIGH (ref 70–99)
Potassium: 3.5 mEq/L (ref 3.5–5.1)
SODIUM: 137 meq/L (ref 135–145)

## 2014-07-09 LAB — MAGNESIUM: Magnesium: 1.7 mg/dL (ref 1.5–2.5)

## 2014-07-09 LAB — TSH: TSH: 3.19 u[IU]/mL (ref 0.35–4.50)

## 2014-07-09 NOTE — Progress Notes (Signed)
[place], [place], [place]  [address] Phone: [phone] Fax:  [phone]  Date:  07/09/2014   ID:  Cheryl [name], DOB 08/14/1945, MRN [id number]  PCP:  Gavin Pound, MD  Cardiologist:  Fransico Him, MD     History of Present Illness: Cheryl [name] is a 68 y.o. female with a history of PAF/flutter s/p ablation a year ago, tachycardia bradycardia syndrome s/p PPM, HTN, ASCAD and dyslipidemia who presents today for followup. She is doing well. She denies any chest pain, dizziness or syncope. Occasionally she will have some ankle edema in the evening.  She has chronic DOE with exertion when it is real cold and is very stable and actually improved. When I last saw her she was having increased episodes of PAF and her Toprol was increased.  She has done well since then.  She says that about twice monthly she will have a breakthrough of palpitations that last about 12-24 hours and then resolve.    Wt Readings from Last 3 Encounters:  07/09/14 181 lb (82.101 kg)  11/06/13 183 lb (83.008 kg)  07/12/13 189 lb (85.73 kg)     Past Medical History  Diagnosis Date  . Hypercholesteremia   . GERD (gastroesophageal reflux disease)   . Hypothyroidism   . Anemia   . Blood transfusion ~ 1962    " couple; no reaction " (05/30/2012)  . H/O hiatal hernia   . Arthritis   . Pacemaker 10/2011  . Myocardial infarction     "very very light" (05/30/2012)  . Ureteral obstruction, right   . Depression   . Coronary artery disease 2013    PCI PCI of OM  . PAF (paroxysmal atrial fibrillation) 05/30/2012    s/p afib/flutter ablation  . Tachy-brady syndrome 10/2011    a. post-termination pauses in setting of PAF;  b. 10/28/11 - St. Jude Dual Chamber PPM SN [id number]   . HTN (hypertension)     Current Outpatient Prescriptions  Medication Sig Dispense Refill  . ALPRAZolam (XANAX) 0.5 MG tablet Take 0.5 mg by mouth as needed.     Marland Kitchen apixaban (ELIQUIS) 5 MG TABS tablet Take 1 tablet (5 mg total)  by mouth 2 (two) times daily. 60 tablet 11  . CRESTOR 10 MG tablet Take 10 mg by mouth every evening.     Marland Kitchen levothyroxine (SYNTHROID, LEVOTHROID) 100 MCG tablet Take 100 mcg by mouth daily before breakfast.     . lisinopril (PRINIVIL,ZESTRIL) 10 MG tablet Take 1 tablet (10 mg total) by mouth daily. 30 tablet 11  . metoprolol succinate (TOPROL-XL) 100 MG 24 hr tablet Take 1 tablet (100 mg total) by mouth daily. Take with or immediately following a meal. 30 tablet 5  . nitroGLYCERIN (NITROSTAT) 0.4 MG SL tablet Place 1 tablet (0.4 mg total) under the tongue every 5 (five) minutes x 3 doses as needed for chest pain. 25 tablet 5   No current facility-administered medications for this visit.    Allergies:    Allergies  Allergen Reactions  . Latex Itching  . Protonix [Pantoprazole Sodium] Other (See Comments)    "think it caused me to have a fib"  . Codeine Nausea And Vomiting  . Tape Rash    "Paper tape is ok"    Social History:  The patient  reports that she quit smoking about 37 years ago. Her smoking use included Cigarettes. She has a 1.8 pack-year smoking history. She has never used smokeless tobacco. She  reports that she does not drink alcohol or use illicit drugs.   Family History:  The patient's family history includes Cancer in her mother; Heart attack (age of onset: 4) in her father; Heart attack (age of onset: 55) in her mother.   ROS:  Please see the history of present illness.      All other systems reviewed and negative.   PHYSICAL EXAM: VS:  BP 124/78 mmHg  Pulse 69  Ht 5\' 3"  (1.6 m)  Wt 181 lb (82.101 kg)  BMI 32.07 kg/m2 Well nourished, well developed, in no acute distress HEENT: normal Neck: no JVD Cardiac:  normal S1, S2; RRR; no murmur Lungs:  clear to auscultation bilaterally, no wheezing, rhonchi or rales Abd: soft, nontender, no hepatomegaly Ext: no edema Skin: warm and dry Neuro:  CNs 2-12 intact, no focal abnormalities noted  EKG:  NSR with nonspecific  ST abnormality   ASSESSMENT AND PLAN:  1. ASCAD with no angina.  Not on ASA due to being on NOAC 2. HTN - well controlled - continue Metoprolol/lisinopril  3. Dyslipidemia - last LDL was 97 and she has been trying to follow a more strict diet - continue Crestor  - I will get a copy of her last FLP and ALT 4. PAF maintaining NSR with occasional episodes of breakthrough.  She is adamant that she does not want to go on Tikosyn.  We cannot use Flecainide due to underlying CAD.  She sees Dr. Rayann Heman in January so I will wait to then to decide on further therapy (amio vs. Repeat ablation). - continue Apixiban/metoprolol  - I will check a BMET, TSH and MG to make sure her electrolytes are ok - I cautioned her about avoiding caffeine.  She does not drink any alcohol 5. Tachybrady syndrome s/p PPM  Followup with me in 6 months      Signed, Fransico Him, MD [facility] 07/09/2014 1:38 PM

## 2014-07-09 NOTE — Patient Instructions (Addendum)
Your physician recommends that you have lab work TODAY (BMET, TSH, Mag).  Your physician wants you to follow-up in: 6 months with Dr. Radford Pax. You will receive a reminder letter in the mail two months in advance. If you don't receive a letter, please call our office to schedule the follow-up appointment.

## 2014-07-11 ENCOUNTER — Encounter (HOSPITAL_COMMUNITY): Payer: Self-pay | Admitting: Internal Medicine

## 2014-07-12 ENCOUNTER — Encounter: Payer: Self-pay | Admitting: Internal Medicine

## 2014-07-23 ENCOUNTER — Encounter: Payer: Self-pay | Admitting: Internal Medicine

## 2014-07-31 ENCOUNTER — Other Ambulatory Visit: Payer: Self-pay | Admitting: *Deleted

## 2014-07-31 DIAGNOSIS — I480 Paroxysmal atrial fibrillation: Secondary | ICD-10-CM

## 2014-07-31 MED ORDER — APIXABAN 5 MG PO TABS: 5.0000 mg | ORAL_TABLET | Freq: Two times a day (BID) | ORAL | Status: DC

## 2014-08-05 ENCOUNTER — Encounter: Payer: Self-pay | Admitting: Internal Medicine

## 2014-08-05 ENCOUNTER — Ambulatory Visit (INDEPENDENT_AMBULATORY_CARE_PROVIDER_SITE_OTHER): Payer: Medicare Other | Admitting: Internal Medicine

## 2014-08-05 VITALS — BP 122/78 | HR 70 | Ht 63.0 in | Wt 179.8 lb

## 2014-08-05 DIAGNOSIS — I10 Essential (primary) hypertension: Secondary | ICD-10-CM | POA: Diagnosis not present

## 2014-08-05 DIAGNOSIS — I495 Sick sinus syndrome: Secondary | ICD-10-CM

## 2014-08-05 DIAGNOSIS — I480 Paroxysmal atrial fibrillation: Secondary | ICD-10-CM

## 2014-08-05 DIAGNOSIS — Z950 Presence of cardiac pacemaker: Secondary | ICD-10-CM | POA: Diagnosis not present

## 2014-08-05 LAB — MDC_IDC_ENUM_SESS_TYPE_INCLINIC
Battery Remaining Longevity: 127.2 mo
Brady Statistic RA Percent Paced: 42 %
Date Time Interrogation Session: 20160104110257
Implantable Pulse Generator Model: 2210
Lead Channel Impedance Value: 412.5 Ohm
Lead Channel Pacing Threshold Amplitude: 0.625 V
Lead Channel Pacing Threshold Amplitude: 1.25 V
Lead Channel Pacing Threshold Amplitude: 1.25 V
Lead Channel Pacing Threshold Pulse Width: 0.7 ms
Lead Channel Sensing Intrinsic Amplitude: 8.2 mV
Lead Channel Setting Pacing Amplitude: 2.5 V
Lead Channel Setting Pacing Pulse Width: 0.7 ms
Lead Channel Setting Sensing Sensitivity: 2 mV
MDC IDC MSMT BATTERY VOLTAGE: 2.96 V
MDC IDC MSMT LEADCHNL RA PACING THRESHOLD PULSEWIDTH: 0.5 ms
MDC IDC MSMT LEADCHNL RA SENSING INTR AMPL: 5 mV
MDC IDC MSMT LEADCHNL RV IMPEDANCE VALUE: 412.5 Ohm
MDC IDC MSMT LEADCHNL RV PACING THRESHOLD PULSEWIDTH: 0.7 ms
MDC IDC PG SERIAL: 7334985
MDC IDC SET LEADCHNL RA PACING AMPLITUDE: 1.625
MDC IDC STAT BRADY RV PERCENT PACED: 1.1 %

## 2014-08-05 NOTE — Progress Notes (Signed)
PCP: Gavin Pound, MD Primary Cardiologist:  Dr Delight Stare is a 69 y.o. female who presents today for routine electrophysiology followup.  She is doing well today.  She has episodes of afib lasting up to 12 hours about twice per month. Today, she denies symptoms of palpitations, chest pain, shortness of breath,  lower extremity edema, presyncope, or syncope.  She also has rare dizziness, water eyes, and cold intolerance.  The patient is otherwise without complaint today.   Past Medical History  Diagnosis Date  . Hypercholesteremia   . GERD (gastroesophageal reflux disease)   . Hypothyroidism   . Anemia   . Blood transfusion ~ 1962    " couple; no reaction " (05/30/2012)  . H/O hiatal hernia   . Arthritis   . Pacemaker 10/2011  . Myocardial infarction     "very very light" (05/30/2012)  . Ureteral obstruction, right   . Depression   . Coronary artery disease 2013    PCI PCI of OM  . PAF (paroxysmal atrial fibrillation) 05/30/2012    s/p afib/flutter ablation  . Tachy-brady syndrome 10/2011    a. post-termination pauses in setting of PAF;  b. 10/28/11 - St. Jude Dual Chamber PPM SN [id number]   . HTN (hypertension)    Past Surgical History  Procedure Laterality Date  . Tee without cardioversion  05/29/2012    Procedure: TRANSESOPHAGEAL ECHOCARDIOGRAM (TEE);  Surgeon: Sueanne Margarita, MD;  Location[facility] ENDOSCOPY;  Service: Cardiovascular;  Laterality: N/A;  h/p in file drawer/dl  . Atrial fibrillation ablation  05/30/2012    PVI and CTi ablation by Dr Rayann Heman  . Breast surgery  1990's    "right radial scar" (05/30/2012)  . Insert / replace / remove pacemaker  10/28/2011    initial placement  . Coronary angioplasty  ~ 04/2012  . Cystoscopy with retrograde pyelogram, ureteroscopy and stent placement  08/21/2012    Procedure: CYSTOSCOPY WITH RETROGRADE PYELOGRAM, URETEROSCOPY AND STENT PLACEMENT;  Surgeon: Franchot [place], MD;  Location: [facility] ORS;  Service: Urology;  Laterality:  Right;  URETEROSCOPY WITH BIOPSY   . Permanent pacemaker insertion N/A 10/28/2011    Procedure: PERMANENT PACEMAKER INSERTION;  Surgeon: [name] Lance, MD;  Location: [facility] CATH LAB;  Service: Cardiovascular;  Laterality: N/A;  . Left heart catheterization with coronary angiogram N/A 04/04/2012    Procedure: LEFT HEART CATHETERIZATION WITH CORONARY ANGIOGRAM;  Surgeon: Sinclair Grooms, MD;  Location: [facility] CATH LAB;  Service: Cardiovascular;  Laterality: N/A;  . Percutaneous coronary intervention-balloon only Right 04/04/2012    Procedure: PERCUTANEOUS CORONARY INTERVENTION-BALLOON ONLY;  Surgeon: Sinclair Grooms, MD;  Location: [facility] CATH LAB;  Service: Cardiovascular;  Laterality: Right;  . Atrial fibrillation ablation N/A 05/30/2012    Procedure: ATRIAL FIBRILLATION ABLATION;  Surgeon: Thompson Grayer, MD;  Location[facility] CATH LAB;  Service: Cardiovascular;  Laterality: N/A;    Current Outpatient Prescriptions  Medication Sig Dispense Refill  . apixaban (ELIQUIS) 5 MG TABS tablet Take 1 tablet (5 mg total) by mouth 2 (two) times daily. 60 tablet 11  . CRESTOR 10 MG tablet Take 10 mg by mouth every evening.     Marland Kitchen levothyroxine (SYNTHROID, LEVOTHROID) 100 MCG tablet Take 100 mcg by mouth daily before breakfast.     . lisinopril (PRINIVIL,ZESTRIL) 10 MG tablet Take 1 tablet (10 mg total) by mouth daily. 30 tablet 11  . metoprolol succinate (TOPROL-XL) 100 MG 24 hr tablet Take 1 tablet (100 mg total) by mouth daily.  Take with or immediately following a meal. 30 tablet 5  . nitroGLYCERIN (NITROSTAT) 0.4 MG SL tablet Place 1 tablet (0.4 mg total) under the tongue every 5 (five) minutes x 3 doses as needed for chest pain. 25 tablet 5  . ALPRAZolam (XANAX) 0.5 MG tablet Take 0.5 mg by mouth daily as needed for anxiety.      No current facility-administered medications for this visit.   ROS- all systems are reviewed and negative except as per HPI above  Physical Exam: Filed Vitals:   08/05/14 1042  BP:  122/78  Pulse: 70  Height: 5\' 3"  (1.6 m)  Weight: 179 lb 12.8 oz (81.557 kg)    GEN- The patient is anxious appearing, alert and oriented x 3 today.   Head- normocephalic, atraumatic Eyes-  Sclera clear, conjunctiva pink Ears- hearing intact Oropharynx- clear Lungs- Clear to ausculation bilaterally, normal work of breathing Heart- Regular rate and rhythm, no murmurs, rubs or gallops, PMI not laterally displaced GI- soft, NT, ND, + BS Extremities- no clubbing, cyanosis, or edema  Pacemaker interrogation today is reviewed, see paceart  Assessment and Plan:  1. afib Well conrolled off of AAD therapy Overall afib burden is 3.8% (2.5% last year) Continue eliquis for chads2vasc score of at least 4  labs 07/09/14 are reviewed  Therapeutic strategies for afib including medicine and repeat ablation were discussed in detail with the patient today.  Options of multaq, sotalol, and tikosyn were discussed.  Presently, she is very reluctant to start AAD therapy and also is not ready to consider ablation.  I think that multaq would probably be the easiest AAD option for her to consider.  She will contemplate these options and follow-up with Dr Radford Pax.  I will see again in 1 year unless problems arise in the interim.  Should her af worsen, I am happy to see her again sooner.  2. Tachy/brady syndrome Normal pacemaker function See Pace Art report No changes today  3. HTN Stable No change required today  Merlin Return to see me in 1 year She will contact me if her afib burden increases in the interim. Continue to follow-up with Dr Radford Pax as scheduled

## 2014-08-05 NOTE — Patient Instructions (Signed)
Remote monitoring is used to monitor your Pacemaker of ICD from home. This monitoring reduces the number of office visits required to check your device to one time per year. It allows [place] to keep an eye on the functioning of your device to ensure it is working properly. You are scheduled for a device check from home on 11/04/14. You may send your transmission at any time that day. If you have a wireless device, the transmission will be sent automatically. After your physician reviews your transmission, you will receive a postcard with your next transmission date.  Your physician wants you to follow-up in: 1 year with Dr. Rayann Heman. You will receive a reminder letter in the mail two months in advance. If you don't receive a letter, please call our office to schedule the follow-up appointment.  Your physician recommends that you continue on your current medications as directed. Please refer to the Current Medication list given to you today.

## 2014-08-13 ENCOUNTER — Encounter: Payer: Self-pay | Admitting: Internal Medicine

## 2014-09-19 DIAGNOSIS — I10 Essential (primary) hypertension: Secondary | ICD-10-CM | POA: Diagnosis not present

## 2014-09-19 DIAGNOSIS — Z23 Encounter for immunization: Secondary | ICD-10-CM | POA: Diagnosis not present

## 2014-09-19 DIAGNOSIS — Z0000 Encounter for general adult medical examination without abnormal findings: Secondary | ICD-10-CM | POA: Diagnosis not present

## 2014-09-19 DIAGNOSIS — E039 Hypothyroidism, unspecified: Secondary | ICD-10-CM | POA: Diagnosis not present

## 2014-09-19 DIAGNOSIS — E785 Hyperlipidemia, unspecified: Secondary | ICD-10-CM | POA: Diagnosis not present

## 2014-09-23 ENCOUNTER — Other Ambulatory Visit: Payer: Self-pay | Admitting: *Deleted

## 2014-09-23 DIAGNOSIS — I119 Hypertensive heart disease without heart failure: Secondary | ICD-10-CM

## 2014-09-23 MED ORDER — LISINOPRIL 10 MG PO TABS: 10.0000 mg | ORAL_TABLET | Freq: Every day | ORAL | Status: DC

## 2014-10-10 ENCOUNTER — Encounter: Payer: Self-pay | Admitting: Cardiology

## 2014-10-18 ENCOUNTER — Encounter: Payer: Self-pay | Admitting: Internal Medicine

## 2014-11-04 ENCOUNTER — Ambulatory Visit (INDEPENDENT_AMBULATORY_CARE_PROVIDER_SITE_OTHER): Payer: Medicare Other | Admitting: *Deleted

## 2014-11-04 ENCOUNTER — Encounter: Payer: Self-pay | Admitting: Internal Medicine

## 2014-11-04 DIAGNOSIS — I495 Sick sinus syndrome: Secondary | ICD-10-CM

## 2014-11-04 LAB — MDC_IDC_ENUM_SESS_TYPE_REMOTE
Battery Remaining Longevity: 82 mo
Battery Voltage: 2.95 V
Brady Statistic AP VP Percent: 1 %
Brady Statistic AP VS Percent: 50 %
Brady Statistic AS VP Percent: 1 %
Brady Statistic RV Percent Paced: 1.5 %
Date Time Interrogation Session: 20160404062706
Implantable Pulse Generator Serial Number: 7334985
Lead Channel Impedance Value: 410 Ohm
Lead Channel Impedance Value: 430 Ohm
Lead Channel Pacing Threshold Amplitude: 0.875 V
Lead Channel Pacing Threshold Amplitude: 1.25 V
Lead Channel Pacing Threshold Pulse Width: 0.5 ms
Lead Channel Pacing Threshold Pulse Width: 0.7 ms
Lead Channel Sensing Intrinsic Amplitude: 4.5 mV
Lead Channel Setting Pacing Amplitude: 1.875
Lead Channel Setting Pacing Amplitude: 2.5 V
Lead Channel Setting Pacing Pulse Width: 0.7 ms
MDC IDC MSMT BATTERY REMAINING PERCENTAGE: 72 %
MDC IDC MSMT LEADCHNL RV SENSING INTR AMPL: 7.5 mV
MDC IDC SET LEADCHNL RV SENSING SENSITIVITY: 2 mV
MDC IDC STAT BRADY AS VS PERCENT: 50 %
MDC IDC STAT BRADY RA PERCENT PACED: 46 %

## 2014-11-04 NOTE — Progress Notes (Signed)
Remote pacemaker transmission.   

## 2014-11-05 ENCOUNTER — Telehealth: Payer: Self-pay | Admitting: Internal Medicine

## 2014-11-05 ENCOUNTER — Other Ambulatory Visit: Payer: Self-pay | Admitting: Cardiology

## 2014-11-05 NOTE — Telephone Encounter (Signed)
New message ° ° ° ° ° °1. Has your device fired? no °2. Is you device beeping? no ° °3. Are you experiencing draining or swelling at device site? no ° °4. Are you calling to see if we received your device transmission?yes °5. Have you passed out? no °

## 2014-11-05 NOTE — Telephone Encounter (Signed)
Informed pt that transmission was received.  

## 2014-11-18 ENCOUNTER — Encounter: Payer: Self-pay | Admitting: Cardiology

## 2014-11-22 ENCOUNTER — Telehealth: Payer: Self-pay | Admitting: Cardiology

## 2014-11-22 DIAGNOSIS — M25561 Pain in right knee: Secondary | ICD-10-CM | POA: Diagnosis not present

## 2014-11-22 DIAGNOSIS — M25551 Pain in right hip: Secondary | ICD-10-CM | POA: Diagnosis not present

## 2014-11-22 NOTE — Telephone Encounter (Signed)
New message      Is it ok to take prednisone for arthritis?  Her orthopedic want to presc it.

## 2014-11-22 NOTE — Telephone Encounter (Signed)
Pt asking if Ok with Dr Radford Pax for her to take prednisone for arthritis recommended by her orthopedic MD. Pt states it is not an emergency and not necessary for me to discuss with another provider today since Dr Radford Pax is off this afternoon.  Pt advised I will forward to Dr Radford Pax for review

## 2014-11-22 NOTE — Telephone Encounter (Signed)
That is fine 

## 2014-11-25 NOTE — Telephone Encounter (Signed)
Left message to call back  

## 2014-11-26 NOTE — Telephone Encounter (Signed)
Per Dr. Radford Pax, informed patient it is OK to take prednisone.

## 2014-11-26 NOTE — Telephone Encounter (Signed)
Follow up  ° ° °Patient returning call back to nurse  °

## 2014-11-26 NOTE — Telephone Encounter (Signed)
Left message to call back  

## 2014-12-03 ENCOUNTER — Encounter: Payer: Self-pay | Admitting: Cardiology

## 2015-01-02 NOTE — Progress Notes (Signed)
Cardiology Office Note   Date:  01/03/2015   ID:  Cheryl [name], DOB 1946-07-23, MRN [id number]  PCP:  Gavin Pound, MD    Chief Complaint  Patient presents with  . Follow-up    PAF      History of Present Illness: Cheryl [name] is a 69 y.o. female with a history of PAF/flutter s/p ablation, tachy/brady syndrome s/p PPM, HTN, ASCAD and dyslipidemia who presents today for followup. She is doing well. She denies any chest pain, dizziness or syncope. Occasionally she will have some ankle edema in the evening. She denies any SOB unless she is in afib.  She says that about twice monthly she will have a breakthrough of palpitations that last about 12-24 hours and then resolve.      Past Medical History  Diagnosis Date  . Hypercholesteremia   . GERD (gastroesophageal reflux disease)   . Hypothyroidism   . Anemia   . Blood transfusion ~ 1962    " couple; no reaction " (05/30/2012)  . H/O hiatal hernia   . Arthritis   . Pacemaker 10/2011  . Myocardial infarction     "very very light" (05/30/2012)  . Ureteral obstruction, right   . Depression   . Coronary artery disease 2013    PCI PCI of OM  . PAF (paroxysmal atrial fibrillation) 05/30/2012    s/p afib/flutter ablation  . Tachy-brady syndrome 10/2011    a. post-termination pauses in setting of PAF;  b. 10/28/11 - St. Jude Dual Chamber PPM SN [id number]   . HTN (hypertension)     Past Surgical History  Procedure Laterality Date  . Tee without cardioversion  05/29/2012    Procedure: TRANSESOPHAGEAL ECHOCARDIOGRAM (TEE);  Surgeon: Sueanne Margarita, MD;  Location: [facility] ENDOSCOPY;  Service: Cardiovascular;  Laterality: N/A;  h/p in file drawer/dl  . Atrial fibrillation ablation  05/30/2012    PVI and CTi ablation by Dr Rayann Heman  . Breast surgery  1990's    "right radial scar" (05/30/2012)  . Insert / replace / remove pacemaker  10/28/2011    initial placement  . Coronary angioplasty  ~ 04/2012  . Cystoscopy with  retrograde pyelogram, ureteroscopy and stent placement  08/21/2012    Procedure: CYSTOSCOPY WITH RETROGRADE PYELOGRAM, URETEROSCOPY AND STENT PLACEMENT;  Surgeon: Franchot Gallo, MD;  Location: [facility] ORS;  Service: Urology;  Laterality: Right;  URETEROSCOPY WITH BIOPSY   . Permanent pacemaker insertion N/A 10/28/2011    Procedure: PERMANENT PACEMAKER INSERTION;  Surgeon: [name] Lance, MD;  Location: [facility] CATH LAB;  Service: Cardiovascular;  Laterality: N/A;  . Left heart catheterization with coronary angiogram N/A 04/04/2012    Procedure: LEFT HEART CATHETERIZATION WITH CORONARY ANGIOGRAM;  Surgeon: Sinclair Grooms, MD;  Location: [facility] CATH LAB;  Service: Cardiovascular;  Laterality: N/A;  . Percutaneous coronary intervention-balloon only Right 04/04/2012    Procedure: PERCUTANEOUS CORONARY INTERVENTION-BALLOON ONLY;  Surgeon: Sinclair Grooms, MD;  Location: [facility] CATH LAB;  Service: Cardiovascular;  Laterality: Right;  . Atrial fibrillation ablation N/A 05/30/2012    Procedure: ATRIAL FIBRILLATION ABLATION;  Surgeon: Thompson Grayer, MD;  Location: [facility] CATH LAB;  Service: Cardiovascular;  Laterality: N/A;     Current Outpatient Prescriptions  Medication Sig Dispense Refill  . ALPRAZolam (XANAX) 0.5 MG tablet Take 0.5 mg by mouth daily as needed for anxiety.     Marland Kitchen apixaban (ELIQUIS) 5 MG TABS  tablet Take 1 tablet (5 mg total) by mouth 2 (two) times daily. 60 tablet 11  . CRESTOR 10 MG tablet Take 10 mg by mouth every evening.     Marland Kitchen levothyroxine (SYNTHROID, LEVOTHROID) 100 MCG tablet Take 100 mcg by mouth daily before breakfast.     . lisinopril (PRINIVIL,ZESTRIL) 10 MG tablet Take 1 tablet (10 mg total) by mouth daily. 30 tablet 11  . metoprolol succinate (TOPROL-XL) 100 MG 24 hr tablet TAKE 1 TABLET BY MOUTH DAILY WITH OR IMMEDIATELY FOLLOWING A MEAL 30 tablet 1  . nitroGLYCERIN (NITROSTAT) 0.4 MG SL tablet Place 1 tablet (0.4 mg total) under the tongue every 5 (five) minutes x 3 doses as needed for  chest pain. 25 tablet 5   No current facility-administered medications for this visit.    Allergies:   Latex; Protonix; Codeine; and Tape    Social History:  The patient  reports that she quit smoking about 37 years ago. Her smoking use included Cigarettes. She has a 1.8 pack-year smoking history. She has never used smokeless tobacco. She reports that she does not drink alcohol or use illicit drugs.   Family History:  The patient's family history includes Cancer in her mother; Heart attack (age of onset: 28) in her father; Heart attack (age of onset: 63) in her mother.    ROS:  Please see the history of present illness.   Otherwise, review of systems are positive for none.   All other systems are reviewed and negative.    PHYSICAL EXAM: VS:  BP 120/76 mmHg  Pulse 65  Ht 5\' 3"  (1.6 m)  Wt 181 lb (82.101 kg)  BMI 32.07 kg/m2  SpO2 98% , BMI Body mass index is 32.07 kg/(m^2). GEN: Well nourished, well developed, in no acute distress HEENT: normal Neck: no JVD, carotid bruits, or masses Cardiac: RRR; no murmurs, rubs, or gallops,no edema  Respiratory:  clear to auscultation bilaterally, normal work of breathing GI: soft, nontender, nondistended, + BS MS: no deformity or atrophy Skin: warm and dry, no rash Neuro:  Strength and sensation are intact Psych: euthymic mood, full affect   EKG:  EKG is not ordered today.    Recent Labs: 07/09/2014: BUN 16; Creatinine 0.9; Magnesium 1.7; Potassium 3.5; Sodium 137; TSH 3.19    Lipid Panel    Component Value Date/Time   CHOL 169 07/18/2013 1120   TRIG 106.0 07/18/2013 1120   HDL 50.90 07/18/2013 1120   CHOLHDL 3 07/18/2013 1120   VLDL 21.2 07/18/2013 1120   LDLCALC 97 07/18/2013 1120      Wt Readings from Last 3 Encounters:  01/03/15 181 lb (82.101 kg)  08/05/14 179 lb 12.8 oz (81.557 kg)  07/09/14 181 lb (82.101 kg)    ASSESSMENT AND PLAN:  1. ASCAD with no angina. Not on ASA due to being on NOAC 2. HTN - well  controlled - continue Metoprolol/lisinopril  3. Dyslipidemia - last LDL was 97 and she has been trying to follow a more strict diet - continue Crestor  - I will get a copy of her last FLP and ALT 4. PAF maintaining NSR with occasional episodes of breakthrough. She is adamant that she does not want to go on Tikosyn. We cannot use Flecainide due to underlying CAD. CHADS2VASC score is 4 and is on NOAC. - continue Apixiban/metoprolol  - check NOAC panel today 5. Tachybrady syndrome s/p PPM    Current medicines are reviewed at length with the patient today.  The  patient does not have concerns regarding medicines.  The following changes have been made:  no change  Labs/ tests ordered today: See above Assessment and Plan No orders of the defined types were placed in this encounter.     Disposition:   FU with me in 6 months  Signed, Sueanne Margarita, MD  01/03/2015 10:00 AM    [facility] [place], [place], [place]  [address] Phone: [phone]; Fax: [phone]

## 2015-01-03 ENCOUNTER — Other Ambulatory Visit: Payer: Self-pay | Admitting: Cardiology

## 2015-01-03 ENCOUNTER — Encounter: Payer: Self-pay | Admitting: Cardiology

## 2015-01-03 ENCOUNTER — Ambulatory Visit (INDEPENDENT_AMBULATORY_CARE_PROVIDER_SITE_OTHER): Payer: Medicare Other | Admitting: Cardiology

## 2015-01-03 VITALS — BP 120/76 | HR 65 | Ht 63.0 in | Wt 181.0 lb

## 2015-01-03 DIAGNOSIS — I480 Paroxysmal atrial fibrillation: Secondary | ICD-10-CM | POA: Diagnosis not present

## 2015-01-03 DIAGNOSIS — I2583 Coronary atherosclerosis due to lipid rich plaque: Secondary | ICD-10-CM

## 2015-01-03 DIAGNOSIS — I2510 Atherosclerotic heart disease of native coronary artery without angina pectoris: Secondary | ICD-10-CM | POA: Diagnosis not present

## 2015-01-03 DIAGNOSIS — E7800 Pure hypercholesterolemia, unspecified: Secondary | ICD-10-CM

## 2015-01-03 DIAGNOSIS — Z950 Presence of cardiac pacemaker: Secondary | ICD-10-CM

## 2015-01-03 DIAGNOSIS — I10 Essential (primary) hypertension: Secondary | ICD-10-CM

## 2015-01-03 DIAGNOSIS — I495 Sick sinus syndrome: Secondary | ICD-10-CM | POA: Diagnosis not present

## 2015-01-03 LAB — CBC WITH DIFFERENTIAL/PLATELET
Basophils Absolute: 0.1 10*3/uL (ref 0.0–0.1)
Basophils Relative: 1.3 % (ref 0.0–3.0)
Eosinophils Absolute: 0.1 10*3/uL (ref 0.0–0.7)
Eosinophils Relative: 2.5 % (ref 0.0–5.0)
HCT: 40.7 % (ref 36.0–46.0)
Hemoglobin: 13.7 g/dL (ref 12.0–15.0)
Lymphocytes Relative: 46.6 % — ABNORMAL HIGH (ref 12.0–46.0)
Lymphs Abs: 2.4 10*3/uL (ref 0.7–4.0)
MCHC: 33.6 g/dL (ref 30.0–36.0)
MCV: 83.9 fl (ref 78.0–100.0)
Monocytes Absolute: 0.4 10*3/uL (ref 0.1–1.0)
Monocytes Relative: 8.1 % (ref 3.0–12.0)
Neutro Abs: 2.1 10*3/uL (ref 1.4–7.7)
Neutrophils Relative %: 41.5 % — ABNORMAL LOW (ref 43.0–77.0)
Platelets: 277 10*3/uL (ref 150.0–400.0)
RBC: 4.85 Mil/uL (ref 3.87–5.11)
RDW: 13.4 % (ref 11.5–15.5)
WBC: 5.1 10*3/uL (ref 4.0–10.5)

## 2015-01-03 LAB — BASIC METABOLIC PANEL
BUN: 14 mg/dL (ref 6–23)
CO2: 31 mEq/L (ref 19–32)
CREATININE: 0.89 mg/dL (ref 0.40–1.20)
Calcium: 9.5 mg/dL (ref 8.4–10.5)
Chloride: 102 mEq/L (ref 96–112)
GFR: 67.08 mL/min (ref >60.00)
Glucose, Bld: 99 mg/dL (ref 70–99)
Potassium: 4 mEq/L (ref 3.5–5.1)
SODIUM: 138 meq/L (ref 135–145)

## 2015-01-03 NOTE — Patient Instructions (Signed)
Medication Instructions:  Your physician recommends that you continue on your current medications as directed. Please refer to the Current Medication list given to you today.   Labwork: TODAY: BMET, CBC  Testing/Procedures: None  Follow-Up: Your physician wants you to follow-up in: 6 months with Dr. Radford Pax. You will receive a reminder letter in the mail two months in advance. If you don't receive a letter, please call our office to schedule the follow-up appointment.   Any Other Special Instructions Will Be Listed Below (If Applicable).

## 2015-01-05 ENCOUNTER — Encounter: Payer: Self-pay | Admitting: Internal Medicine

## 2015-01-09 ENCOUNTER — Encounter: Payer: Self-pay | Admitting: Cardiology

## 2015-01-10 ENCOUNTER — Telehealth: Payer: Self-pay | Admitting: Cardiology

## 2015-01-10 DIAGNOSIS — E7800 Pure hypercholesterolemia, unspecified: Secondary | ICD-10-CM

## 2015-01-10 NOTE — Telephone Encounter (Signed)
-----   Message from Cheryl Margarita, MD sent at 01/09/2015  5:09 PM EDT ----- LDL 85 in 09/2014 and goal is < 70.  Please have patient come in for repeat FLP and ALT

## 2015-01-10 NOTE — Telephone Encounter (Signed)
New message ° ° ° ° °Returning Katy's call °

## 2015-01-10 NOTE — Telephone Encounter (Signed)
Fasting lab work scheduled for 01/24/15. Patient agrees with treatment plan.

## 2015-01-24 ENCOUNTER — Other Ambulatory Visit: Payer: Medicare Other

## 2015-01-27 ENCOUNTER — Other Ambulatory Visit: Payer: Self-pay

## 2015-02-04 ENCOUNTER — Encounter: Payer: Self-pay | Admitting: Internal Medicine

## 2015-02-04 ENCOUNTER — Ambulatory Visit (INDEPENDENT_AMBULATORY_CARE_PROVIDER_SITE_OTHER): Payer: Medicare Other | Admitting: *Deleted

## 2015-02-04 DIAGNOSIS — I495 Sick sinus syndrome: Secondary | ICD-10-CM | POA: Diagnosis not present

## 2015-02-05 NOTE — Progress Notes (Signed)
Remote pacemaker transmission.   

## 2015-02-09 LAB — CUP PACEART REMOTE DEVICE CHECK
Battery Remaining Longevity: 107 mo
Battery Voltage: 2.96 V
Brady Statistic AP VS Percent: 53 %
Brady Statistic AS VP Percent: 1 %
Brady Statistic RA Percent Paced: 48 %
Brady Statistic RV Percent Paced: 1.8 %
Lead Channel Impedance Value: 380 Ohm
Lead Channel Impedance Value: 400 Ohm
Lead Channel Pacing Threshold Pulse Width: 0.7 ms
Lead Channel Sensing Intrinsic Amplitude: 8.4 mV
Lead Channel Setting Pacing Amplitude: 1.875
Lead Channel Setting Pacing Amplitude: 2.5 V
Lead Channel Setting Pacing Pulse Width: 0.7 ms
Lead Channel Setting Sensing Sensitivity: 2 mV
MDC IDC MSMT BATTERY REMAINING PERCENTAGE: 95.5 %
MDC IDC MSMT LEADCHNL RA PACING THRESHOLD AMPLITUDE: 0.875 V
MDC IDC MSMT LEADCHNL RA PACING THRESHOLD PULSEWIDTH: 0.5 ms
MDC IDC MSMT LEADCHNL RA SENSING INTR AMPL: 5 mV
MDC IDC MSMT LEADCHNL RV PACING THRESHOLD AMPLITUDE: 1.25 V
MDC IDC SESS DTM: 20160705131428
MDC IDC STAT BRADY AP VP PERCENT: 1 %
MDC IDC STAT BRADY AS VS PERCENT: 47 %
Pulse Gen Model: 2210
Pulse Gen Serial Number: 7334985

## 2015-02-10 ENCOUNTER — Other Ambulatory Visit (INDEPENDENT_AMBULATORY_CARE_PROVIDER_SITE_OTHER): Payer: Medicare Other | Admitting: *Deleted

## 2015-02-10 DIAGNOSIS — E7800 Pure hypercholesterolemia, unspecified: Secondary | ICD-10-CM

## 2015-02-10 LAB — LIPID PANEL
Cholesterol: 136 mg/dL (ref 0–200)
HDL: 48.8 mg/dL (ref >39.00)
LDL CALC: 71 mg/dL (ref 0–99)
NonHDL: 87.2
Total CHOL/HDL Ratio: 3
Triglycerides: 80 mg/dL (ref 0.0–149.0)
VLDL: 16 mg/dL (ref 0.0–40.0)

## 2015-02-10 LAB — ALT: ALT: 12 U/L (ref 0–35)

## 2015-02-12 ENCOUNTER — Encounter: Payer: Self-pay | Admitting: Cardiology

## 2015-03-06 ENCOUNTER — Encounter: Payer: Self-pay | Admitting: Cardiology

## 2015-05-08 ENCOUNTER — Telehealth: Payer: Self-pay | Admitting: Cardiology

## 2015-05-08 ENCOUNTER — Encounter: Payer: Medicare Other | Admitting: *Deleted

## 2015-05-08 NOTE — Telephone Encounter (Signed)
Spoke with pt and reminded pt of remote transmission that is due today. Pt verbalized understanding.   

## 2015-05-09 ENCOUNTER — Encounter: Payer: Self-pay | Admitting: Internal Medicine

## 2015-05-09 ENCOUNTER — Ambulatory Visit (INDEPENDENT_AMBULATORY_CARE_PROVIDER_SITE_OTHER): Payer: Medicare Other | Admitting: *Deleted

## 2015-05-09 ENCOUNTER — Encounter: Payer: Self-pay | Admitting: Cardiology

## 2015-05-09 DIAGNOSIS — I495 Sick sinus syndrome: Secondary | ICD-10-CM | POA: Diagnosis not present

## 2015-05-12 NOTE — Progress Notes (Signed)
Remote pacemaker transmission.   

## 2015-05-15 LAB — CUP PACEART REMOTE DEVICE CHECK
Battery Remaining Longevity: 107 mo
Brady Statistic AP VS Percent: 54 %
Brady Statistic AS VS Percent: 45 %
Brady Statistic RV Percent Paced: 2.3 %
Date Time Interrogation Session: 20161007181340
Implantable Lead Implant Date: 20130328
Lead Channel Impedance Value: 430 Ohm
Lead Channel Pacing Threshold Amplitude: 1.25 V
Lead Channel Pacing Threshold Pulse Width: 0.7 ms
Lead Channel Sensing Intrinsic Amplitude: 5 mV
Lead Channel Sensing Intrinsic Amplitude: 7.6 mV
Lead Channel Setting Pacing Pulse Width: 0.7 ms
MDC IDC LEAD IMPLANT DT: 20130328
MDC IDC LEAD LOCATION: 753859
MDC IDC LEAD LOCATION: 753860
MDC IDC MSMT BATTERY REMAINING PERCENTAGE: 95.5 %
MDC IDC MSMT BATTERY VOLTAGE: 2.96 V
MDC IDC MSMT LEADCHNL RA PACING THRESHOLD AMPLITUDE: 0.75 V
MDC IDC MSMT LEADCHNL RA PACING THRESHOLD PULSEWIDTH: 0.5 ms
MDC IDC MSMT LEADCHNL RV IMPEDANCE VALUE: 380 Ohm
MDC IDC SET LEADCHNL RA PACING AMPLITUDE: 1.75 V
MDC IDC SET LEADCHNL RV PACING AMPLITUDE: 2.5 V
MDC IDC SET LEADCHNL RV SENSING SENSITIVITY: 2 mV
MDC IDC STAT BRADY AP VP PERCENT: 1 %
MDC IDC STAT BRADY AS VP PERCENT: 1 %
MDC IDC STAT BRADY RA PERCENT PACED: 49 %
Pulse Gen Model: 2210
Pulse Gen Serial Number: 7334985

## 2015-05-19 ENCOUNTER — Telehealth: Payer: Self-pay | Admitting: Internal Medicine

## 2015-05-19 NOTE — Telephone Encounter (Signed)
°  1. Has your device fired? no ° °2. Is you device beeping? no ° °3. Are you experiencing draining or swelling at device site? no ° °4. Are you calling to see if we received your device transmission? yes ° °5. Have you passed out? no ° °

## 2015-05-19 NOTE — Telephone Encounter (Signed)
Returned patient's call.  Explained that her device transmission was received on 05/09/15 and her device function is stable.  Patient states that she received a letter in the mail stating she was overdue for a remote check, but I explained that the letter was likely sent on the same day as she transmitted.  Patient voices understanding and denies additional questions or concerns at this time.  Patient appreciative of return call and aware to call with worsening symptoms, questions, or concerns.

## 2015-05-29 ENCOUNTER — Telehealth: Payer: Self-pay | Admitting: Internal Medicine

## 2015-05-29 NOTE — Telephone Encounter (Signed)
Error. Pt had a question for her pharmacy. Will call them and ask about her Eliquis

## 2015-06-13 ENCOUNTER — Encounter: Payer: Self-pay | Admitting: Cardiology

## 2015-06-30 ENCOUNTER — Telehealth: Payer: Self-pay | Admitting: Internal Medicine

## 2015-06-30 NOTE — Telephone Encounter (Signed)
New problem   Pt stated she has been having afib for the last week and need to speak to someone because she is going on a cruise 12.2.16. Please advise pt.

## 2015-06-30 NOTE — Telephone Encounter (Signed)
Talked with patient usually only has afib once or twice a month but over last week has been in it most of the last 7 days.  Feels it in her chest and is more short of breath with activity.  Unsure of whether to start new medication when she will be out of town starting on Friday but feels like she should be checked before leaving. Appointment given for tomorrow at 48.

## 2015-07-01 ENCOUNTER — Encounter (HOSPITAL_COMMUNITY): Payer: Self-pay | Admitting: Nurse Practitioner

## 2015-07-01 ENCOUNTER — Ambulatory Visit (HOSPITAL_COMMUNITY)
Admission: RE | Admit: 2015-07-01 | Discharge: 2015-07-01 | Disposition: A | Discharge status: Home / Self Care | Payer: Medicare Other | Source: Ambulatory Visit | Attending: Nurse Practitioner | Admitting: Nurse Practitioner

## 2015-07-01 VITALS — BP 118/78 | HR 77 | Ht 63.0 in | Wt 171.8 lb

## 2015-07-01 DIAGNOSIS — I480 Paroxysmal atrial fibrillation: Secondary | ICD-10-CM

## 2015-07-01 DIAGNOSIS — I4891 Unspecified atrial fibrillation: Secondary | ICD-10-CM | POA: Insufficient documentation

## 2015-07-01 MED ORDER — DILTIAZEM HCL 30 MG PO TABS: ORAL_TABLET | ORAL | Status: DC

## 2015-07-01 MED ORDER — TOPROL XL 100 MG PO TB24: 100.0000 mg | ORAL_TABLET | Freq: Every day | ORAL | Status: DC

## 2015-07-01 NOTE — Patient Instructions (Signed)
Your physician has recommended you make the following change in your medication:   1)Cardizem 30mg -- take 1 tablet every 4 hours AS NEEDED for heart rate >100 as long as blood pressure >100.   

## 2015-07-01 NOTE — Progress Notes (Signed)
Patient ID: Cheryl [name], female   DOB: 08/07/1945, 69 y.o.   MRN: [id number]     Primary Care Physician: Gavin Pound, MD Referring Physician: Dr. Delman Cheadle is a 69 y.o. female with a h/o PAF s/p ablation, here today to discuss more frequent breakthrough episodes of afib over the last one to two weeks. This is worrisome for her as well because she is planning a 5 day cruise in the next week. Her usual pattern is 1-2 days a month for 12-15 hours at a time. She has been offered sotalol, multaq, tikosyn in the past, but she has declined and is offered these again today, but is still very hesitate to start an AAD. She dose have a f/u with Dr. Rayann Heman early January and does wish to discuss repeat ablation. She is in sinus today. No triggers identified.  Today, she denies symptoms of palpitations, chest pain, shortness of breath, orthopnea, PND, lower extremity edema, dizziness, presyncope, syncope, or neurologic sequela. The patient is tolerating medications without difficulties and is otherwise without complaint today.   Past Medical History  Diagnosis Date  . Hypercholesteremia   . GERD (gastroesophageal reflux disease)   . Hypothyroidism   . Anemia   . Blood transfusion ~ 1962    " couple; no reaction " (05/30/2012)  . H/O hiatal hernia   . Arthritis   . Pacemaker 10/2011  . Myocardial infarction [facility])     "very very light" (05/30/2012)  . Ureteral obstruction, right   . Depression   . Coronary artery disease 2013    PCI PCI of OM  . PAF (paroxysmal atrial fibrillation) ([place]) 05/30/2012    s/p afib/flutter ablation  . Tachy-brady syndrome ([place]) 10/2011    a. post-termination pauses in setting of PAF;  b. 10/28/11 - St. Jude Dual Chamber PPM SN [id number]   . HTN (hypertension)    Past Surgical History  Procedure Laterality Date  . Tee without cardioversion  05/29/2012    Procedure: TRANSESOPHAGEAL ECHOCARDIOGRAM (TEE);  Surgeon: Sueanne Margarita, MD;  Location: [facility] ENDOSCOPY;   Service: Cardiovascular;  Laterality: N/A;  h/p in file drawer/dl  . Atrial fibrillation ablation  05/30/2012    PVI and CTi ablation by Dr Rayann Heman  . Breast surgery  1990's    "right radial scar" (05/30/2012)  . Insert / replace / remove pacemaker  10/28/2011    initial placement  . Coronary angioplasty  ~ 04/2012  . Cystoscopy with retrograde pyelogram, ureteroscopy and stent placement  08/21/2012    Procedure: CYSTOSCOPY WITH RETROGRADE PYELOGRAM, URETEROSCOPY AND STENT PLACEMENT;  Surgeon: Franchot Gallo, MD;  Location: [facility] ORS;  Service: Urology;  Laterality: Right;  URETEROSCOPY WITH BIOPSY   . Permanent pacemaker insertion N/A 10/28/2011    Procedure: PERMANENT PACEMAKER INSERTION;  Surgeon: [name] Lance, MD;  Location: [facility] CATH LAB;  Service: Cardiovascular;  Laterality: N/A;  . Left heart catheterization with coronary angiogram N/A 04/04/2012    Procedure: LEFT HEART CATHETERIZATION WITH CORONARY ANGIOGRAM;  Surgeon: Sinclair Grooms, MD;  Location: [facility] CATH LAB;  Service: Cardiovascular;  Laterality: N/A;  . Percutaneous coronary intervention-balloon only Right 04/04/2012    Procedure: PERCUTANEOUS CORONARY INTERVENTION-BALLOON ONLY;  Surgeon: Sinclair Grooms, MD;  Location: [facility] CATH LAB;  Service: Cardiovascular;  Laterality: Right;  . Atrial fibrillation ablation N/A 05/30/2012    Procedure: ATRIAL FIBRILLATION ABLATION;  Surgeon: Thompson Grayer, MD;  Location: [facility] CATH LAB;  Service: Cardiovascular;  Laterality: N/A;  Current Outpatient Prescriptions  Medication Sig Dispense Refill  . ALPRAZolam (XANAX) 0.5 MG tablet Take 0.5 mg by mouth daily as needed for anxiety.     Marland Kitchen apixaban (ELIQUIS) 5 MG TABS tablet Take 1 tablet (5 mg total) by mouth 2 (two) times daily. 60 tablet 11  . CRESTOR 10 MG tablet Take 10 mg by mouth every evening.     Marland Kitchen levothyroxine (SYNTHROID, LEVOTHROID) 100 MCG tablet Take 100 mcg by mouth daily before breakfast.     . lisinopril (PRINIVIL,ZESTRIL) 10 MG  tablet Take 1 tablet (10 mg total) by mouth daily. 30 tablet 11  . diltiazem (CARDIZEM) 30 MG tablet Cardizem 30mg  -- take 1 tablet every 4 hours AS NEEDED for heart rate >100 as long as blood pressure >100. 45 tablet 1  . nitroGLYCERIN (NITROSTAT) 0.4 MG SL tablet Place 1 tablet (0.4 mg total) under the tongue every 5 (five) minutes x 3 doses as needed for chest pain. 25 tablet 5  . TOPROL XL 100 MG 24 hr tablet Take 1 tablet (100 mg total) by mouth daily. Take with or immediately following a meal. 30 tablet 6   No current facility-administered medications for this encounter.    Allergies  Allergen Reactions  . Latex Itching  . Protonix [Pantoprazole Sodium] Other (See Comments)    "think it caused me to have a fib"  . Codeine Nausea And Vomiting  . Tape Rash    "Paper tape is ok"    Social History   Social History  . Marital Status: Married    Spouse Name: N/A  . Number of Children: 4  . Years of Education: N/A   Occupational History  . Not on file.   Social History Main Topics  . Smoking status: Former Smoker -- 0.12 packs/day for 15 years    Types: Cigarettes    Quit date: 06/17/1977  . Smokeless tobacco: Never Used     Comment: QUIT smoking cigarettes 1978  . Alcohol Use: No  . Drug Use: No  . Sexual Activity: No   Other Topics Concern  . Not on file   Social History Narrative   Lives with husband.  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 Heart attack Father 68  . Heart attack Mother 59  . Cancer Mother     Died age 83 with pancreatic cancer, also had uterine and breast cancer    ROS- All systems are reviewed and negative except as per the HPI above  Physical Exam: Filed Vitals:   07/01/15 1118  BP: 118/78  Pulse: 77  Height: 5\' 3"  (1.6 m)  Weight: 171 lb 12.8 oz (77.928 kg)    GEN- The patient is well appearing, alert and oriented x 3 today.   Head- normocephalic, atraumatic Eyes-  Sclera clear,  conjunctiva pink Ears- hearing intact Oropharynx- clear Neck- supple, no JVP Lymph- no cervical lymphadenopathy Lungs- Clear to ausculation bilaterally, normal work of breathing Heart- Regular rate and rhythm, no murmurs, rubs or gallops, PMI not laterally displaced GI- soft, NT, ND, + BS Extremities- no clubbing, cyanosis, or edema MS- no significant deformity or atrophy Skin- no rash or lesion Psych- euthymic mood, full affect Neuro- strength and sensation are intact  EKG- NSR with nonspecific T wave abnormality, v rate 77 bpm, pr int 144 ms, qrs int 70 ms, qtc 454 ms Epic records reviewed  Assessment and Plan: 1. afib Higher burden recently  Still defers AAD's Would like 30 mg cardizem to use if needed for afib with v rate of over 100 and sys BP over 100 Also she thinks she did better with brand metoprolol so will change to toprol xl 100 mg daily.  Continue apixaban. Triggers reviewed  F/u with Dr. Rayann Heman 1/18 and pt is leaning toward repeat ablation  Butch Penny C. Aquilla Voiles, [facility] [address] [place], [place] [address] [phone]

## 2015-07-03 DIAGNOSIS — J069 Acute upper respiratory infection, unspecified: Secondary | ICD-10-CM | POA: Diagnosis not present

## 2015-07-30 ENCOUNTER — Other Ambulatory Visit: Payer: Self-pay | Admitting: Cardiology

## 2015-08-20 ENCOUNTER — Ambulatory Visit (INDEPENDENT_AMBULATORY_CARE_PROVIDER_SITE_OTHER): Payer: Medicare Other | Admitting: Internal Medicine

## 2015-08-20 ENCOUNTER — Encounter: Payer: Self-pay | Admitting: Internal Medicine

## 2015-08-20 VITALS — BP 110/80 | HR 75 | Ht 63.0 in | Wt 173.6 lb

## 2015-08-20 DIAGNOSIS — I119 Hypertensive heart disease without heart failure: Secondary | ICD-10-CM

## 2015-08-20 DIAGNOSIS — I10 Essential (primary) hypertension: Secondary | ICD-10-CM

## 2015-08-20 DIAGNOSIS — I480 Paroxysmal atrial fibrillation: Secondary | ICD-10-CM | POA: Diagnosis not present

## 2015-08-20 DIAGNOSIS — I495 Sick sinus syndrome: Secondary | ICD-10-CM | POA: Diagnosis not present

## 2015-08-20 LAB — CUP PACEART INCLINIC DEVICE CHECK
Battery Voltage: 2.95 V
Brady Statistic RA Percent Paced: 49 %
Brady Statistic RV Percent Paced: 2.6 %
Implantable Lead Implant Date: 20130328
Implantable Lead Location: 753859
Implantable Lead Location: 753860
Lead Channel Impedance Value: 375 Ohm
Lead Channel Impedance Value: 375 Ohm
Lead Channel Pacing Threshold Amplitude: 0.75 V
Lead Channel Sensing Intrinsic Amplitude: 5 mV
Lead Channel Sensing Intrinsic Amplitude: 8.5 mV
MDC IDC LEAD IMPLANT DT: 20130328
MDC IDC MSMT BATTERY REMAINING LONGEVITY: 109.2
MDC IDC MSMT LEADCHNL RA PACING THRESHOLD PULSEWIDTH: 0.5 ms
MDC IDC MSMT LEADCHNL RV PACING THRESHOLD AMPLITUDE: 1.25 V
MDC IDC MSMT LEADCHNL RV PACING THRESHOLD PULSEWIDTH: 0.7 ms
MDC IDC PG SERIAL: 7334985
MDC IDC SESS DTM: 20170118144917
MDC IDC SET LEADCHNL RA PACING AMPLITUDE: 1.75 V
MDC IDC SET LEADCHNL RV PACING AMPLITUDE: 2.5 V
MDC IDC SET LEADCHNL RV PACING PULSEWIDTH: 0.7 ms
MDC IDC SET LEADCHNL RV SENSING SENSITIVITY: 2 mV
Pulse Gen Model: 2210

## 2015-08-20 MED ORDER — LISINOPRIL 10 MG PO TABS: 10.0000 mg | ORAL_TABLET | Freq: Every day | ORAL | Status: DC

## 2015-08-20 MED ORDER — ROSUVASTATIN CALCIUM 10 MG PO TABS: 10.0000 mg | ORAL_TABLET | Freq: Every evening | ORAL | Status: DC

## 2015-08-20 NOTE — Patient Instructions (Addendum)
Medication Instructions:  Your physician recommends that you continue on your current medications as directed. Please refer to the Current Medication list given to you today.   Labwork: Your physician recommends that you return for lab work on 09/15/15 at 10:00am  You do not have to fast   Testing/Procedures: Your physician has requested that you have cardiac CT. Cardiac computed tomography (CT) is a painless test that uses an x-ray machine to take clear, detailed pictures of your heart. For further information please visit [email or web address]. Please follow instruction sheet as given.---will schedule for the week prior to ablation   Your physician has recommended that you have an ablation. Catheter ablation is a medical procedure used to treat some cardiac arrhythmias (irregular heartbeats). During catheter ablation, a long, thin, flexible tube is put into a blood vessel in your groin (upper thigh), or neck. This tube is called an ablation catheter. It is then guided to your heart through the blood vessel. Radio frequency waves destroy small areas of heart tissue where abnormal heartbeats may cause an arrhythmia to start. Please see the instruction sheet given to you today.   Ablation is scheduled for 09/23/15 at 10:00am.  Please check in at the [facility] at 8:00am.  Do not eat or drink after midnight the night before your ablation.  Do not take any medications the morning of your ablation   Follow-Up: Your physician recommends that you schedule a follow-up appointment in: 4 weeks from 09/23/15 with Dr Rayann Heman   Any Other Special Instructions Will Be Listed Below (If Applicable).     If you need a refill on your cardiac medications before your next appointment, please call your pharmacy.

## 2015-08-20 NOTE — Progress Notes (Signed)
PCP: Cheryl Solo, MD Primary Cardiologist:  Dr Delight Stare is a 70 y.o. female who presents today for routine electrophysiology followup.  Her afib has progressed.  She appears to be having very frequent salvos of afib throughout most days.  She is unaware of triggers.  Reports symptoms of fatigue and palpitations. Today, she denies symptoms of chest pain, shortness of breath,  lower extremity edema, presyncope, or syncope.  She has been seen in the AF clinic but has been reluctant to make changes.  The patient is otherwise without complaint today.   Past Medical History  Diagnosis Date  . Hypercholesteremia   . GERD (gastroesophageal reflux disease)   . Hypothyroidism   . Anemia   . Blood transfusion ~ 1962    " couple; no reaction " (05/30/2012)  . H/O hiatal hernia   . Arthritis   . Pacemaker 10/2011  . Myocardial infarction [facility])     "very very light" (05/30/2012)  . Ureteral obstruction, right   . Depression   . Coronary artery disease 2013    PCI PCI of OM  . PAF (paroxysmal atrial fibrillation) ([place]) 05/30/2012    s/p afib/flutter ablation  . Tachy-brady syndrome ([place]) 10/2011    a. post-termination pauses in setting of PAF;  b. 10/28/11 - St. Jude Dual Chamber PPM SN [id number]   . HTN (hypertension)    Past Surgical History  Procedure Laterality Date  . Tee without cardioversion  05/29/2012    Procedure: TRANSESOPHAGEAL ECHOCARDIOGRAM (TEE);  Surgeon: Sueanne Margarita, MD;  Location: [facility] ENDOSCOPY;  Service: Cardiovascular;  Laterality: N/A;  h/p in file drawer/dl  . Atrial fibrillation ablation  05/30/2012    PVI and CTi ablation by Dr Rayann Heman  . Breast surgery  1990's    "right radial scar" (05/30/2012)  . Insert / replace / remove pacemaker  10/28/2011    initial placement  . Coronary angioplasty  ~ 04/2012  . Cystoscopy with retrograde pyelogram, ureteroscopy and stent placement  08/21/2012    Procedure: CYSTOSCOPY WITH RETROGRADE PYELOGRAM, URETEROSCOPY AND  STENT PLACEMENT;  Surgeon: Franchot Gallo, MD;  Location: [facility] ORS;  Service: Urology;  Laterality: Right;  URETEROSCOPY WITH BIOPSY   . Permanent pacemaker insertion N/A 10/28/2011    Procedure: PERMANENT PACEMAKER INSERTION;  Surgeon: [name] Lance, MD;  Location: [facility] CATH LAB;  Service: Cardiovascular;  Laterality: N/A;  . Left heart catheterization with coronary angiogram N/A 04/04/2012    Procedure: LEFT HEART CATHETERIZATION WITH CORONARY ANGIOGRAM;  Surgeon: Sinclair Grooms, MD;  Location: [facility] CATH LAB;  Service: Cardiovascular;  Laterality: N/A;  . Percutaneous coronary intervention-balloon only Right 04/04/2012    Procedure: PERCUTANEOUS CORONARY INTERVENTION-BALLOON ONLY;  Surgeon: Sinclair Grooms, MD;  Location: [facility] CATH LAB;  Service: Cardiovascular;  Laterality: Right;  . Atrial fibrillation ablation N/A 05/30/2012    Procedure: ATRIAL FIBRILLATION ABLATION;  Surgeon: Thompson Grayer, MD;  Location: [facility] CATH LAB;  Service: Cardiovascular;  Laterality: N/A;    Current Outpatient Prescriptions  Medication Sig Dispense Refill  . ALPRAZolam (XANAX) 0.5 MG tablet Take 0.5 mg by mouth daily as needed for anxiety.     . CRESTOR 10 MG tablet Take 10 mg by mouth every evening.     . diltiazem (CARDIZEM) 30 MG tablet Cardizem 30mg  -- take 1 tablet by mouth every 4 hours AS NEEDED for heart rate >100 as long as blood pressure >100.    Marland Kitchen ELIQUIS 5 MG TABS tablet TAKE ONE TABLET  BY MOUTH TWICE DAILY 60 tablet 6  . levothyroxine (SYNTHROID, LEVOTHROID) 100 MCG tablet Take 100 mcg by mouth daily before breakfast.     . lisinopril (PRINIVIL,ZESTRIL) 10 MG tablet Take 1 tablet (10 mg total) by mouth daily. 30 tablet 11  . nitroGLYCERIN (NITROSTAT) 0.4 MG SL tablet Place 1 tablet (0.4 mg total) under the tongue every 5 (five) minutes x 3 doses as needed for chest pain. 25 tablet 5  . TOPROL XL 100 MG 24 hr tablet Take 1 tablet (100 mg total) by mouth daily. Take with or immediately following a meal. 30  tablet 6   No current facility-administered medications for this visit.   ROS- all systems are reviewed and negative except as per HPI above  Physical Exam: Filed Vitals:   08/20/15 1407  BP: 110/80  Pulse: 75  Height: 5\' 3"  (1.6 m)  Weight: 173 lb 9.6 oz (78.744 kg)    GEN- The patient is anxious appearing, alert and oriented x 3 today.   Head- normocephalic, atraumatic Eyes-  Sclera clear, conjunctiva pink Ears- hearing intact Oropharynx- clear Lungs- Clear to ausculation bilaterally, normal work of breathing Heart- Regular rate and rhythm, no murmurs, rubs or gallops, PMI not laterally displaced GI- soft, NT, ND, + BS Extremities- no clubbing, cyanosis, or edema  Pacemaker interrogation today is reviewed, see paceart  Assessment and Plan:  1. afib AF burden has increased over the past year. She has previously failed medical therapy and is s/p afib ablation by me in 2013. Pacemaker interrogation reveals salvos of afib and atach, likely arising from a PV source.  I think that ablation would be a very good option for her. Therapeutic strategies for afib including medicine and repeat ablation were discussed in detail with the patient today. Risk, benefits, and alternatives to EP study and radiofrequency ablation for afib were also discussed in detail today. These risks include but are not limited to stroke, bleeding, vascular damage, tamponade, perforation, damage to the esophagus, lungs, and other structures, pulmonary vein stenosis, worsening renal function, and death. The patient understands these risk and wishes to proceed.  We will therefore proceed with catheter ablation at the next available time. Will obtain cardiac CT prior to ablation to evaluate for LA thrombus and also to evaluate pulmonary veins. Will obtain echo to evaluate LA size and valve function (last echo several years ago).  2. Tachy/brady syndrome Normal pacemaker function See Pace Art report No changes  today  3. HTN Stable No change required today  Thompson Grayer MD, [facility] 08/20/2015 2:43 PM

## 2015-08-28 ENCOUNTER — Encounter: Payer: Self-pay | Admitting: Cardiology

## 2015-08-28 ENCOUNTER — Encounter: Payer: Self-pay | Admitting: Internal Medicine

## 2015-08-29 ENCOUNTER — Telehealth: Payer: Self-pay | Admitting: Internal Medicine

## 2015-08-29 NOTE — Telephone Encounter (Signed)
New message  Pt c/o medication issue  1. Name of Medication: Afib medication  4. What is your medication issue? Pt called states that she was supposed to take her AFIB medication if something happens. However she has forgotten what that "something" is. Pt is requesting a call back to discuss the instructions on how to take this medication

## 2015-08-29 NOTE — Telephone Encounter (Signed)
Per Ceasar Lund Would like 30 mg cardizem to use if needed for afib with v rate of over 100 and sys BP over 100  Patient aware of above

## 2015-09-15 ENCOUNTER — Other Ambulatory Visit (INDEPENDENT_AMBULATORY_CARE_PROVIDER_SITE_OTHER): Payer: Medicare Other | Admitting: *Deleted

## 2015-09-15 DIAGNOSIS — I480 Paroxysmal atrial fibrillation: Secondary | ICD-10-CM

## 2015-09-15 DIAGNOSIS — I4891 Unspecified atrial fibrillation: Secondary | ICD-10-CM | POA: Diagnosis not present

## 2015-09-15 LAB — CBC WITH DIFFERENTIAL/PLATELET
BASOS ABS: 0.2 10*3/uL — AB (ref 0.0–0.1)
Basophils Relative: 2 % — ABNORMAL HIGH (ref 0–1)
EOS ABS: 0.2 10*3/uL (ref 0.0–0.7)
EOS PCT: 2 % (ref 0–5)
HCT: 41.8 % (ref 36.0–46.0)
Hemoglobin: 13.8 g/dL (ref 12.0–15.0)
LYMPHS ABS: 2.8 10*3/uL (ref 0.7–4.0)
Lymphocytes Relative: 37 % (ref 12–46)
MCH: 27.7 pg (ref 26.0–34.0)
MCHC: 33 g/dL (ref 30.0–36.0)
MCV: 83.8 fL (ref 78.0–100.0)
MONO ABS: 0.6 10*3/uL (ref 0.1–1.0)
MONOS PCT: 8 % (ref 3–12)
MPV: 9.8 fL (ref 8.6–12.4)
Neutro Abs: 3.8 10*3/uL (ref 1.7–7.7)
Neutrophils Relative %: 51 % (ref 43–77)
PLATELETS: 303 10*3/uL (ref 150–400)
RBC: 4.99 MIL/uL (ref 3.87–5.11)
RDW: 14 % (ref 11.5–15.5)
WBC: 7.5 10*3/uL (ref 4.0–10.5)

## 2015-09-15 LAB — BASIC METABOLIC PANEL
BUN: 13 mg/dL (ref 7–25)
CALCIUM: 9.4 mg/dL (ref 8.6–10.4)
CO2: 27 mmol/L (ref 20–31)
Chloride: 102 mmol/L (ref 98–110)
Creat: 0.92 mg/dL (ref 0.50–0.99)
GLUCOSE: 94 mg/dL (ref 65–99)
POTASSIUM: 3.9 mmol/L (ref 3.5–5.3)
SODIUM: 140 mmol/L (ref 135–146)

## 2015-09-17 ENCOUNTER — Encounter (HOSPITAL_COMMUNITY): Payer: Self-pay

## 2015-09-17 ENCOUNTER — Ambulatory Visit (HOSPITAL_COMMUNITY)
Admission: RE | Admit: 2015-09-17 | Discharge: 2015-09-17 | Disposition: A | Discharge status: Home / Self Care | Payer: Medicare Other | Source: Ambulatory Visit | Attending: Internal Medicine | Admitting: Internal Medicine

## 2015-09-17 DIAGNOSIS — K449 Diaphragmatic hernia without obstruction or gangrene: Secondary | ICD-10-CM | POA: Diagnosis not present

## 2015-09-17 DIAGNOSIS — I480 Paroxysmal atrial fibrillation: Secondary | ICD-10-CM | POA: Diagnosis not present

## 2015-09-17 MED ORDER — METOPROLOL TARTRATE 1 MG/ML IV SOLN
5.0000 mg | INTRAVENOUS | Status: DC | PRN
  Administered 2015-09-17 (×3): 5 mg via INTRAVENOUS

## 2015-09-17 MED ORDER — METOPROLOL TARTRATE 1 MG/ML IV SOLN
INTRAVENOUS | Status: AC
  Administered 2015-09-17: 5 mg via INTRAVENOUS
  Filled 2015-09-17: qty 15

## 2015-09-17 MED ORDER — IOHEXOL 350 MG/ML SOLN
80.0000 mL | Freq: Once | INTRAVENOUS | Status: AC | PRN
  Administered 2015-09-17: 80 mL via INTRAVENOUS

## 2015-09-22 ENCOUNTER — Telehealth: Payer: Self-pay | Admitting: Internal Medicine

## 2015-09-22 NOTE — Telephone Encounter (Signed)
Left message with patient as long as she is afebrile okay to proceed.  She can cal me back if needed.

## 2015-09-22 NOTE — Telephone Encounter (Signed)
Pt is scheduled for procedure tomorrow,whe has a bad cold. Will this interfere with her procedure?

## 2015-09-23 ENCOUNTER — Ambulatory Visit (HOSPITAL_COMMUNITY): Payer: Medicare Other | Admitting: Anesthesiology

## 2015-09-23 ENCOUNTER — Ambulatory Visit (HOSPITAL_COMMUNITY)
Admission: RE | Admit: 2015-09-23 | Discharge: 2015-09-24 | Disposition: A | Discharge status: Home / Self Care | Payer: Medicare Other | Source: Ambulatory Visit | Attending: Internal Medicine | Admitting: Internal Medicine

## 2015-09-23 ENCOUNTER — Encounter (HOSPITAL_COMMUNITY): Payer: Self-pay | Admitting: Anesthesiology

## 2015-09-23 ENCOUNTER — Encounter (HOSPITAL_COMMUNITY)
Admission: RE | Disposition: A | Discharge status: Home / Self Care | Payer: Self-pay | Source: Ambulatory Visit | Attending: Internal Medicine

## 2015-09-23 DIAGNOSIS — Z87891 Personal history of nicotine dependence: Secondary | ICD-10-CM | POA: Insufficient documentation

## 2015-09-23 DIAGNOSIS — D649 Anemia, unspecified: Secondary | ICD-10-CM | POA: Diagnosis not present

## 2015-09-23 DIAGNOSIS — Z7901 Long term (current) use of anticoagulants: Secondary | ICD-10-CM | POA: Diagnosis not present

## 2015-09-23 DIAGNOSIS — I252 Old myocardial infarction: Secondary | ICD-10-CM | POA: Diagnosis not present

## 2015-09-23 DIAGNOSIS — Z23 Encounter for immunization: Secondary | ICD-10-CM | POA: Insufficient documentation

## 2015-09-23 DIAGNOSIS — I2510 Atherosclerotic heart disease of native coronary artery without angina pectoris: Secondary | ICD-10-CM | POA: Diagnosis not present

## 2015-09-23 DIAGNOSIS — I4892 Unspecified atrial flutter: Secondary | ICD-10-CM | POA: Insufficient documentation

## 2015-09-23 DIAGNOSIS — K219 Gastro-esophageal reflux disease without esophagitis: Secondary | ICD-10-CM | POA: Insufficient documentation

## 2015-09-23 DIAGNOSIS — E039 Hypothyroidism, unspecified: Secondary | ICD-10-CM | POA: Diagnosis not present

## 2015-09-23 DIAGNOSIS — I4891 Unspecified atrial fibrillation: Secondary | ICD-10-CM | POA: Diagnosis not present

## 2015-09-23 DIAGNOSIS — E7800 Pure hypercholesterolemia, unspecified: Secondary | ICD-10-CM | POA: Diagnosis not present

## 2015-09-23 DIAGNOSIS — I4819 Other persistent atrial fibrillation: Secondary | ICD-10-CM | POA: Diagnosis present

## 2015-09-23 DIAGNOSIS — I480 Paroxysmal atrial fibrillation: Secondary | ICD-10-CM | POA: Insufficient documentation

## 2015-09-23 DIAGNOSIS — I495 Sick sinus syndrome: Secondary | ICD-10-CM | POA: Diagnosis present

## 2015-09-23 DIAGNOSIS — M1990 Unspecified osteoarthritis, unspecified site: Secondary | ICD-10-CM | POA: Diagnosis not present

## 2015-09-23 DIAGNOSIS — F419 Anxiety disorder, unspecified: Secondary | ICD-10-CM | POA: Insufficient documentation

## 2015-09-23 DIAGNOSIS — Z950 Presence of cardiac pacemaker: Secondary | ICD-10-CM | POA: Diagnosis present

## 2015-09-23 DIAGNOSIS — F329 Major depressive disorder, single episode, unspecified: Secondary | ICD-10-CM | POA: Insufficient documentation

## 2015-09-23 DIAGNOSIS — I10 Essential (primary) hypertension: Secondary | ICD-10-CM | POA: Diagnosis not present

## 2015-09-23 HISTORY — PX: ELECTROPHYSIOLOGIC STUDY: SHX172A

## 2015-09-23 LAB — POCT ACTIVATED CLOTTING TIME
Activated Clotting Time: 348 seconds
Activated Clotting Time: 301 seconds
Activated Clotting Time: 312 seconds
Activated Clotting Time: 137 seconds

## 2015-09-23 LAB — MRSA PCR SCREENING: MRSA by PCR: NEGATIVE

## 2015-09-23 SURGERY — ATRIAL FIBRILLATION ABLATION
Anesthesia: Monitor Anesthesia Care

## 2015-09-23 MED ORDER — IOHEXOL 350 MG/ML SOLN
INTRAVENOUS | Status: DC | PRN
  Administered 2015-09-23: 6 mL via INTRAVENOUS

## 2015-09-23 MED ORDER — LEVOTHYROXINE SODIUM 100 MCG PO TABS
100.0000 ug | ORAL_TABLET | Freq: Every day | ORAL | Status: DC
  Administered 2015-09-24: 100 ug via ORAL
  Filled 2015-09-23: qty 1

## 2015-09-23 MED ORDER — HEPARIN SODIUM (PORCINE) 1000 UNIT/ML IJ SOLN
INTRAMUSCULAR | Status: AC
  Filled 2015-09-23: qty 1

## 2015-09-23 MED ORDER — ONDANSETRON HCL 4 MG/2ML IJ SOLN: 4.0000 mg | Freq: Four times a day (QID) | INTRAMUSCULAR | Status: DC | PRN

## 2015-09-23 MED ORDER — ADENOSINE 6 MG/2ML IV SOLN
INTRAVENOUS | Status: AC
  Filled 2015-09-23: qty 8

## 2015-09-23 MED ORDER — PHENYLEPHRINE HCL 10 MG/ML IJ SOLN
INTRAMUSCULAR | Status: DC | PRN
  Administered 2015-09-23: 80 ug via INTRAVENOUS
  Administered 2015-09-23: 40 ug via INTRAVENOUS
  Administered 2015-09-23: 80 ug via INTRAVENOUS

## 2015-09-23 MED ORDER — BUPIVACAINE HCL (PF) 0.25 % IJ SOLN
INTRAMUSCULAR | Status: DC | PRN
  Administered 2015-09-23: 20 mL

## 2015-09-23 MED ORDER — HEPARIN SODIUM (PORCINE) 1000 UNIT/ML IJ SOLN
INTRAMUSCULAR | Status: DC | PRN
  Administered 2015-09-23: 1 mL via INTRAVENOUS
  Administered 2015-09-23: 12 mL via INTRAVENOUS

## 2015-09-23 MED ORDER — PROPOFOL 10 MG/ML IV BOLUS
INTRAVENOUS | Status: DC | PRN
  Administered 2015-09-23: 10 mg via INTRAVENOUS
  Administered 2015-09-23: 20 mg via INTRAVENOUS
  Administered 2015-09-23: 10 mg via INTRAVENOUS

## 2015-09-23 MED ORDER — SODIUM CHLORIDE 0.9% FLUSH: 3.0000 mL | INTRAVENOUS | Status: DC | PRN

## 2015-09-23 MED ORDER — DOBUTAMINE IN D5W 4-5 MG/ML-% IV SOLN
INTRAVENOUS | Status: DC | PRN
  Administered 2015-09-23: 20 ug/kg/min via INTRAVENOUS

## 2015-09-23 MED ORDER — SODIUM CHLORIDE 0.9 % IV SOLN: 250.0000 mL | INTRAVENOUS | Status: DC | PRN

## 2015-09-23 MED ORDER — BUPIVACAINE HCL (PF) 0.25 % IJ SOLN
INTRAMUSCULAR | Status: AC
  Filled 2015-09-23: qty 30

## 2015-09-23 MED ORDER — INFLUENZA VAC SPLIT QUAD 0.5 ML IM SUSY
0.5000 mL | PREFILLED_SYRINGE | INTRAMUSCULAR | Status: AC
  Administered 2015-09-24: 0.5 mL via INTRAMUSCULAR
  Filled 2015-09-23: qty 0.5

## 2015-09-23 MED ORDER — DOBUTAMINE IN D5W 4-5 MG/ML-% IV SOLN
INTRAVENOUS | Status: AC
  Filled 2015-09-23: qty 250

## 2015-09-23 MED ORDER — LISINOPRIL 10 MG PO TABS
10.0000 mg | ORAL_TABLET | Freq: Every day | ORAL | Status: DC
  Administered 2015-09-24: 10 mg via ORAL
  Filled 2015-09-23: qty 1

## 2015-09-23 MED ORDER — HEPARIN SODIUM (PORCINE) 1000 UNIT/ML IJ SOLN
INTRAMUSCULAR | Status: DC | PRN
  Administered 2015-09-23: 1000 [IU] via INTRAVENOUS

## 2015-09-23 MED ORDER — FENTANYL CITRATE (PF) 100 MCG/2ML IJ SOLN
INTRAMUSCULAR | Status: DC | PRN
  Administered 2015-09-23 (×2): 50 ug via INTRAVENOUS

## 2015-09-23 MED ORDER — HYDROCODONE-ACETAMINOPHEN 5-325 MG PO TABS
1.0000 | ORAL_TABLET | ORAL | Status: DC | PRN
  Administered 2015-09-24: 1 via ORAL
  Filled 2015-09-23: qty 1

## 2015-09-23 MED ORDER — MIDAZOLAM HCL 5 MG/5ML IJ SOLN
INTRAMUSCULAR | Status: DC | PRN
  Administered 2015-09-23: 1 mg via INTRAVENOUS
  Administered 2015-09-23: 2 mg via INTRAVENOUS
  Administered 2015-09-23: 1 mg via INTRAVENOUS

## 2015-09-23 MED ORDER — APIXABAN 5 MG PO TABS
5.0000 mg | ORAL_TABLET | Freq: Two times a day (BID) | ORAL | Status: DC
  Administered 2015-09-23 – 2015-09-24 (×2): 5 mg via ORAL
  Filled 2015-09-23 (×2): qty 1

## 2015-09-23 MED ORDER — SODIUM CHLORIDE 0.9% FLUSH
3.0000 mL | Freq: Two times a day (BID) | INTRAVENOUS | Status: DC
  Administered 2015-09-23 (×2): 3 mL via INTRAVENOUS

## 2015-09-23 MED ORDER — ACETAMINOPHEN 325 MG PO TABS
650.0000 mg | ORAL_TABLET | ORAL | Status: DC | PRN
  Administered 2015-09-24: 650 mg via ORAL
  Filled 2015-09-23: qty 2

## 2015-09-23 MED ORDER — PNEUMOCOCCAL VAC POLYVALENT 25 MCG/0.5ML IJ INJ
0.5000 mL | INJECTION | INTRAMUSCULAR | Status: AC
  Administered 2015-09-24: 0.5 mL via INTRAMUSCULAR
  Filled 2015-09-23: qty 0.5

## 2015-09-23 MED ORDER — PROPOFOL 500 MG/50ML IV EMUL
INTRAVENOUS | Status: DC | PRN
  Administered 2015-09-23: 25 ug/kg/min via INTRAVENOUS

## 2015-09-23 MED ORDER — METOPROLOL SUCCINATE ER 50 MG PO TB24
50.0000 mg | ORAL_TABLET | Freq: Every day | ORAL | Status: DC
  Administered 2015-09-24: 50 mg via ORAL
  Filled 2015-09-23: qty 1

## 2015-09-23 MED ORDER — PROTAMINE SULFATE 10 MG/ML IV SOLN
INTRAVENOUS | Status: DC | PRN
  Administered 2015-09-23: 30 mg via INTRAVENOUS

## 2015-09-23 MED ORDER — SODIUM CHLORIDE 0.9 % IV SOLN
INTRAVENOUS | Status: DC
  Administered 2015-09-23 (×3): via INTRAVENOUS

## 2015-09-23 SURGICAL SUPPLY — 19 items
BAG SNAP BAND KOVER 36X36 (MISCELLANEOUS) ×3 IMPLANT
BLANKET WARM UNDERBOD FULL ACC (MISCELLANEOUS) ×3 IMPLANT
CATH NAVISTAR SMARTTOUCH DF (ABLATOR) ×2 IMPLANT
CATH SOUNDSTAR ECO REPROCESSED (CATHETERS) ×2 IMPLANT
CATH VARIABLE LASSO NAV 2515 (CATHETERS) ×2 IMPLANT
CATH WEBSTER BI DIR CS D-F CRV (CATHETERS) ×2 IMPLANT
COVER SWIFTLINK CONNECTOR (BAG) ×3 IMPLANT
NDL TRANSEP BRK 71CM 407200 (NEEDLE) IMPLANT
NEEDLE TRANSEP BRK 71CM 407200 (NEEDLE) ×3 IMPLANT
PACK EP LATEX FREE (CUSTOM PROCEDURE TRAY) ×3
PACK EP LF (CUSTOM PROCEDURE TRAY) ×1 IMPLANT
PAD DEFIB LIFELINK (PAD) ×3 IMPLANT
PATCH CARTO3 (PAD) ×2 IMPLANT
SHEATH AVANTI 11F 11CM (SHEATH) ×3 IMPLANT
SHEATH PINNACLE 7F 10CM (SHEATH) ×5 IMPLANT
SHEATH PINNACLE 9F 10CM (SHEATH) ×3 IMPLANT
SHEATH SWARTZ TS SL2 63CM 8.5F (SHEATH) ×2 IMPLANT
SHIELD RADPAD SCOOP 12X17 (MISCELLANEOUS) ×3 IMPLANT
TUBING SMART ABLATE COOLFLOW (TUBING) ×4 IMPLANT

## 2015-09-23 NOTE — Anesthesia Preprocedure Evaluation (Signed)
Anesthesia Evaluation  Patient identified by MRN, date of birth, ID band Patient awake    Reviewed: Allergy & Precautions, H&P , NPO status , Patient's Chart, lab work & pertinent test results, reviewed documented beta blocker date and time   Airway Mallampati: I  TM Distance: >3 FB Neck ROM: full    Dental  (+) Edentulous Upper   Pulmonary shortness of breath, former smoker,    Pulmonary exam normal breath sounds clear to auscultation       Cardiovascular hypertension, Pt. on home beta blockers and Pt. on medications + CAD and + Past MI  + dysrhythmias (S/P ablation procedure) Atrial Fibrillation + pacemaker  Rhythm:irregular Rate:Normal     Neuro/Psych PSYCHIATRIC DISORDERS Anxiety    GI/Hepatic Neg liver ROS, hiatal hernia, GERD  Medicated,  Endo/Other  Hypothyroidism   Renal/GU negative Renal ROS     Musculoskeletal  (+) Arthritis ,   Abdominal   Peds  Hematology negative hematology ROS (+) anemia ,   Anesthesia Other Findings   Reproductive/Obstetrics                             Anesthesia Physical  Anesthesia Plan  ASA: III  Anesthesia Plan: MAC   Post-op Pain Management:    Induction: Intravenous  Airway Management Planned:   Additional Equipment:   Intra-op Plan:   Post-operative Plan:   Informed Consent: I have reviewed the patients History and Physical, chart, labs and discussed the procedure including the risks, benefits and alternatives for the proposed anesthesia with the patient or authorized representative who has indicated his/her understanding and acceptance.   Dental advisory given  Plan Discussed with: CRNA  Anesthesia Plan Comments:         Anesthesia Quick Evaluation                                   Anesthesia Evaluation  Patient identified by MRN, date of birth, ID band Patient awake    Reviewed: Allergy & Precautions, H&P , NPO status  , Patient's Chart, lab work & pertinent test results  Airway Mallampati: I TM Distance: >3 FB Neck ROM: full    Dental   Pulmonary shortness of breath,          Cardiovascular hypertension, + CAD + dysrhythmias Atrial Fibrillation + pacemaker Rhythm:irregular Rate:Normal     Neuro/Psych PSYCHIATRIC DISORDERS Anxiety    GI/Hepatic hiatal hernia, GERD-  ,  Endo/Other  Hypothyroidism   Renal/GU      Musculoskeletal   Abdominal   Peds  Hematology   Anesthesia Other Findings   Reproductive/Obstetrics                          Anesthesia Physical Anesthesia Plan  ASA: III  Anesthesia Plan: General   Post-op Pain Management:    Induction: Intravenous  Airway Management Planned: LMA  Additional Equipment:   Intra-op Plan:   Post-operative Plan: Extubation in OR  Informed Consent: I have reviewed the patients History and Physical, chart, labs and discussed the procedure including the risks, benefits and alternatives for the proposed anesthesia with the patient or authorized representative who has indicated his/her understanding and acceptance.     Plan Discussed with: CRNA, Anesthesiologist and Surgeon  Anesthesia Plan Comments:         Anesthesia Quick Evaluation

## 2015-09-23 NOTE — Anesthesia Procedure Notes (Signed)
Procedure Name: LMA Insertion Performed by: Scheryl Darter Pre-anesthesia Checklist: Patient identified, Timeout performed, Emergency Drugs available, Suction available and Patient being monitored Patient Re-evaluated:Patient Re-evaluated prior to inductionOxygen Delivery Method: Circle system utilized Preoxygenation: Pre-oxygenation with 100% oxygen Intubation Type: IV induction Ventilation: Mask ventilation without difficulty and Oral airway inserted - appropriate to patient size LMA: LMA inserted LMA Size: 4.0 Number of attempts: 1 Placement Confirmation: breath sounds checked- equal and bilateral,  positive ETCO2 and CO2 detector Tube secured with: Tape Dental Injury: Teeth and Oropharynx as per pre-operative assessment

## 2015-09-23 NOTE — Anesthesia Postprocedure Evaluation (Signed)
Anesthesia Post Note  Patient: Cheryl [name]  Procedure(s) Performed: Procedure(s) (LRB): Atrial Fibrillation Ablation (N/A)  Patient location during evaluation: Cath Lab Anesthesia Type: General Level of consciousness: oriented, awake and alert and patient cooperative Pain management: pain level controlled Vital Signs Assessment: post-procedure vital signs reviewed and stable Respiratory status: spontaneous breathing, nonlabored ventilation and respiratory function stable Cardiovascular status: blood pressure returned to baseline and stable Postop Assessment: no signs of nausea or vomiting Anesthetic complications: no    Last Vitals:  Filed Vitals:   09/23/15 1630 09/23/15 1700  BP: 96/61 95/57  Pulse: 66 65  Resp: 16 17    Last Pain: There were no vitals filed for this visit.               Midge Minium

## 2015-09-23 NOTE — Transfer of Care (Signed)
Immediate Anesthesia Transfer of Care Note  Patient: Cheryl [name]  Procedure(s) Performed: Procedure(s): Atrial Fibrillation Ablation (N/A)  Patient Location: PACU and Cath Lab  Anesthesia Type:General  Level of Consciousness: awake, alert , oriented and sedated  Airway & Oxygen Therapy: Patient Spontanous Breathing and Patient connected to face mask oxygen  Post-op Assessment: Report given to RN, Post -op Vital signs reviewed and stable and Patient moving all extremities  Post vital signs: Reviewed and stable  Last Vitals: There were no vitals filed for this visit.  Complications: No apparent anesthesia complications

## 2015-09-23 NOTE — Discharge Summary (Signed)
ELECTROPHYSIOLOGY PROCEDURE DISCHARGE SUMMARY    Patient ID: Cheryl [name],  MRN: [id number], DOB/AGE: 69-Dec-1947 69 y.o.  Admit date: 09/23/2015 Discharge date: 09/24/2015  Primary Care Physician: Tawanna Solo, MD Primary Cardiologist: Radford Pax Electrophysiologist: Thompson Grayer, MD  Primary Discharge Diagnosis:  Persistent atrial fibrillation status post ablation this admission  Secondary Discharge Diagnosis:  1.  Tachy/brady syndrome s/p PPM 2.  Hypertension 3.  Hyperlipidemia 4.  CAD  Procedures This Admission:  1.  Electrophysiology study and radiofrequency catheter ablation on 09/23/15 by Dr Thompson Grayer.  This study demonstrated sinus rhythm upon presentation with salvos of atrial fibrillation occuring incessantly and arising from the right inferior pulmonary vein; successful electrical isolation and anatomical encircling of all four pulmonary veins with radiofrequency current using a WACA technique; CTI block confirmed from a prior ablation procedure;no inducible arrhythmias following ablation both on and off of dobutamine; no early apparent complications.  Brief HPI: Cheryl [name] is a 70 y.o. female with a history of persistent atrial fibrillation. She underwent previous ablation in 2013 and did well initially but developed recurrent AF.  They have failed medical therapy with Toprol and Cardizem. Risks, benefits, and alternatives to catheter ablation of atrial fibrillation were reviewed with the patient who wished to proceed.  The patient underwent cardiac CT prior to the procedure which demonstrated no LAA thrombus and no PV stenosis.    Hospital Course:  The patient was admitted and underwent EPS/RFCA of atrial fibrillation with details as outlined above.  They were monitored on telemetry overnight which demonstrated sinus rhythm.  Groin was without complication on the day of discharge.  The patient was examined and considered to be stable for discharge.  Wound care and  restrictions were reviewed with the patient.  The patient will be seen back by Roderic Palau, NP in 4 weeks and Dr Rayann Heman in 12 weeks for post ablation follow up.  She has a non productive cough this morning with some chest soreness. Will discharge with Tessalon.   This patients CHA2DS2-VASc Score and unadjusted Ischemic Stroke Rate (% per year) is equal to 4.8 % stroke rate/year from a score of 4 Above score calculated as 1 point each if present [CHF, HTN, DM, Vascular=MI/PAD/Aortic Plaque, Age if 65-74, or Female] Above score calculated as 2 points each if present [Age > 75, or Stroke/TIA/TE]   Physical Exam: Filed Vitals:   09/24/15 0200 09/24/15 0300 09/24/15 0400 09/24/15 0600  BP: 115/55  120/57 106/48  Pulse: 74 78 80 78  Temp:  97.7 F (36.5 C)    TempSrc:  Oral    Resp: 20 20 17 20   Height:      Weight:      SpO2: 96% 96% 97% 96%    GEN- The patient is well appearing, alert and oriented x 3 today.   HEENT: normocephalic, atraumatic; sclera clear, conjunctiva pink; hearing intact; oropharynx clear; neck supple  Lungs- Clear to ausculation bilaterally, normal work of breathing.  No wheezes, rales, rhonchi Heart- Regular rate and rhythm, no murmurs, rubs or gallops  GI- soft, non-tender, non-distended, bowel sounds present  Extremities- no clubbing, cyanosis, or edema; DP/PT/radial pulses 2+ bilaterally, groin without hematoma/bruit MS- no significant deformity or atrophy Skin- warm and dry, no rash or lesion Psych- euthymic mood, full affect Neuro- strength and sensation are intact   Labs:   Lab Results  Component Value Date   WBC 7.5 09/15/2015   HGB 13.8 09/15/2015   HCT 41.8 09/15/2015  MCV 83.8 09/15/2015   PLT 303 09/15/2015   No results for input(s): NA, K, CL, CO2, BUN, CREATININE, CALCIUM, PROT, BILITOT, ALKPHOS, ALT, AST, GLUCOSE in the last 168 hours.  Invalid input(s): LABALBU   Discharge Medications:    Medication List    TAKE these medications          benzonatate 100 MG capsule  Commonly known as:  TESSALON PERLES  Take 1 capsule (100 mg total) by mouth 3 (three) times daily as needed for cough.     ELIQUIS 5 MG Tabs tablet  Generic drug:  apixaban  TAKE ONE TABLET BY MOUTH TWICE DAILY     levothyroxine 100 MCG tablet  Commonly known as:  SYNTHROID, LEVOTHROID  Take 100 mcg by mouth daily before breakfast.     lisinopril 10 MG tablet  Commonly known as:  PRINIVIL,ZESTRIL  Take 1 tablet (10 mg total) by mouth daily.     metoprolol succinate 50 MG 24 hr tablet  Commonly known as:  TOPROL-XL  Take 1 tablet (50 mg total) by mouth daily. Take with or immediately following a meal.     nitroGLYCERIN 0.4 MG SL tablet  Commonly known as:  NITROSTAT  Place 1 tablet (0.4 mg total) under the tongue every 5 (five) minutes x 3 doses as needed for chest pain.     pantoprazole 40 MG tablet  Commonly known as:  PROTONIX  Take 1 tablet (40 mg total) by mouth daily. Take for 6 weeks then discontinue     rosuvastatin 10 MG tablet  Commonly known as:  CRESTOR  Take 1 tablet (10 mg total) by mouth every evening.        Disposition:  Discharge Instructions    Diet - low sodium heart healthy    Complete by:  As directed      Discharge instructions    Complete by:  As directed   No driving for 4 days. No lifting over 5 lbs for 1 week. No sexual activity for 1 week. You may return to work in 1 week. Keep procedure site clean & dry. If you notice increased pain, swelling, bleeding or pus, call/return!  You may shower, but no soaking baths/hot tubs/pools for 1 week.     Increase activity slowly    Complete by:  As directed           Follow-up Information    Follow up with [facility] On 10/22/2015.   Specialty:  Cardiology   Why:  at [facility] information:   [id number] [place] [phone]      Follow up with Thompson Grayer, MD On 12/24/2015.    Specialty:  Cardiology   Why:  at 9:30AM    Contact information:   [place] [address] [phone]       Duration of Discharge Encounter: Greater than 30 minutes including physician time.  Signed, Chanetta Marshall, NP 09/24/2015 8:17 AM  I have seen, examined the patient, and reviewed the above assessment and plan. On exam, RRR.  Changes to above are made where necessary.    Co Sign: Thompson Grayer, MD 09/24/2015 8:19 AM

## 2015-09-23 NOTE — Progress Notes (Signed)
Site area: RT GROIN Site Prior to Removal:  Level 0 Pressure Applied For:15 MINUTES Manual: YES   Patient Status During Pull:  AWAKE Post Pull Site:  Level 0 Post Pull Instructions Given: YES  Post Pull Pulses Present:  Dressing Applied:  YES Bedrest begins @ 14:40 Comments:

## 2015-09-23 NOTE — H&P (Signed)
PCP: Tawanna Solo, MD Primary Cardiologist: Dr Delight Stare is a 70 y.o. female who presents today for afib ablation. Her afib has progressed. She appears to be having very frequent salvos of afib throughout most days. She is unaware of triggers. Reports symptoms of fatigue and palpitations. Today, she denies symptoms of chest pain, shortness of breath, lower extremity edema, presyncope, or syncope. She has been seen in the AF clinic but has been reluctant to make changes. The patient is otherwise without complaint today.   Past Medical History  Diagnosis Date  . Hypercholesteremia   . GERD (gastroesophageal reflux disease)   . Hypothyroidism   . Anemia   . Blood transfusion ~ 1962    " couple; no reaction " (05/30/2012)  . H/O hiatal hernia   . Arthritis   . Pacemaker 10/2011  . Myocardial infarction [facility])     "very very light" (05/30/2012)  . Ureteral obstruction, right   . Depression   . Coronary artery disease 2013    PCI PCI of OM  . PAF (paroxysmal atrial fibrillation) ([place]) 05/30/2012    s/p afib/flutter ablation  . Tachy-brady syndrome ([place]) 10/2011    a. post-termination pauses in setting of PAF; b. 10/28/11 - St. Jude Dual Chamber PPM SN [id number]   . HTN (hypertension)    Past Surgical History  Procedure Laterality Date  . Tee without cardioversion  05/29/2012    Procedure: TRANSESOPHAGEAL ECHOCARDIOGRAM (TEE); Surgeon: Sueanne Margarita, MD; Location: [facility] ENDOSCOPY; Service: Cardiovascular; Laterality: N/A; h/p in file drawer/dl  . Atrial fibrillation ablation  05/30/2012    PVI and CTi ablation by Dr Rayann Heman  . Breast surgery  1990's    "right radial scar" (05/30/2012)  . Insert / replace / remove pacemaker  10/28/2011    initial placement  . Coronary angioplasty  ~ 04/2012  . Cystoscopy with retrograde pyelogram, ureteroscopy and stent placement   08/21/2012    Procedure: CYSTOSCOPY WITH RETROGRADE PYELOGRAM, URETEROSCOPY AND STENT PLACEMENT; Surgeon: Franchot Gallo, MD; Location: [facility] ORS; Service: Urology; Laterality: Right; URETEROSCOPY WITH BIOPSY   . Permanent pacemaker insertion N/A 10/28/2011    Procedure: PERMANENT PACEMAKER INSERTION; Surgeon: [name] Lance, MD; Location: [facility] CATH LAB; Service: Cardiovascular; Laterality: N/A;  . Left heart catheterization with coronary angiogram N/A 04/04/2012    Procedure: LEFT HEART CATHETERIZATION WITH CORONARY ANGIOGRAM; Surgeon: Sinclair Grooms, MD; Location: [facility] CATH LAB; Service: Cardiovascular; Laterality: N/A;  . Percutaneous coronary intervention-balloon only Right 04/04/2012    Procedure: PERCUTANEOUS CORONARY INTERVENTION-BALLOON ONLY; Surgeon: Sinclair Grooms, MD; Location: [facility] CATH LAB; Service: Cardiovascular; Laterality: Right;  . Atrial fibrillation ablation N/A 05/30/2012    Procedure: ATRIAL FIBRILLATION ABLATION; Surgeon: Thompson Grayer, MD; Location: [facility] CATH LAB; Service: Cardiovascular; Laterality: N/A;    Current Outpatient Prescriptions  Medication Sig Dispense Refill  . ALPRAZolam (XANAX) 0.5 MG tablet Take 0.5 mg by mouth daily as needed for anxiety.     . CRESTOR 10 MG tablet Take 10 mg by mouth every evening.     . diltiazem (CARDIZEM) 30 MG tablet Cardizem 30mg  -- take 1 tablet by mouth every 4 hours AS NEEDED for heart rate >100 as long as blood pressure >100.    Marland Kitchen ELIQUIS 5 MG TABS tablet TAKE ONE TABLET BY MOUTH TWICE DAILY 60 tablet 6  . levothyroxine (SYNTHROID, LEVOTHROID) 100 MCG tablet Take 100 mcg by mouth daily before breakfast.     . lisinopril (PRINIVIL,ZESTRIL) 10 MG tablet Take 1 tablet (  10 mg total) by mouth daily. 30 tablet 11  . nitroGLYCERIN (NITROSTAT) 0.4 MG SL tablet Place 1 tablet (0.4 mg total) under the tongue every 5 (five) minutes x 3 doses as needed for  chest pain. 25 tablet 5  . TOPROL XL 100 MG 24 hr tablet Take 1 tablet (100 mg total) by mouth daily. Take with or immediately following a meal. 30 tablet 6   No current facility-administered medications for this visit.   ROS- all systems are reviewed and negative except as per HPI above  Physical Exam:   Vitals pending GEN- The patient is anxious appearing, alert and oriented x 3 today.  Head- normocephalic, atraumatic Eyes- Sclera clear, conjunctiva pink Ears- hearing intact Oropharynx- clear Lungs- Clear to ausculation bilaterally, normal work of breathing Heart- irregular rate and rhythm  GI- soft, NT, ND, + BS Extremities- no clubbing, cyanosis, or edema   Assessment and Plan:  1. afib AF burden has increased over the past year. She has previously failed medical therapy and is s/p afib ablation by me in 2013.   Therapeutic strategies for afib including medicine and repeat ablation were discussed in detail with the patient today. Risk, benefits, and alternatives to EP study and radiofrequency ablation for afib were also discussed in detail today. These risks include but are not limited to stroke, bleeding, vascular damage, tamponade, perforation, damage to the esophagus, lungs, and other structures, pulmonary vein stenosis, worsening renal function, and death. The patient understands these risk and wishes to proceed.  Cardiac CT is reviewed   Thompson Grayer MD, [facility] 09/23/2015 10:50 AM

## 2015-09-24 ENCOUNTER — Encounter (HOSPITAL_COMMUNITY): Payer: Self-pay | Admitting: Internal Medicine

## 2015-09-24 DIAGNOSIS — I252 Old myocardial infarction: Secondary | ICD-10-CM | POA: Diagnosis not present

## 2015-09-24 DIAGNOSIS — I480 Paroxysmal atrial fibrillation: Secondary | ICD-10-CM | POA: Diagnosis not present

## 2015-09-24 DIAGNOSIS — F329 Major depressive disorder, single episode, unspecified: Secondary | ICD-10-CM | POA: Diagnosis not present

## 2015-09-24 DIAGNOSIS — E7800 Pure hypercholesterolemia, unspecified: Secondary | ICD-10-CM | POA: Diagnosis not present

## 2015-09-24 DIAGNOSIS — E039 Hypothyroidism, unspecified: Secondary | ICD-10-CM | POA: Diagnosis not present

## 2015-09-24 DIAGNOSIS — I2510 Atherosclerotic heart disease of native coronary artery without angina pectoris: Secondary | ICD-10-CM | POA: Diagnosis not present

## 2015-09-24 MED ORDER — PANTOPRAZOLE SODIUM 40 MG PO TBEC: 40.0000 mg | DELAYED_RELEASE_TABLET | Freq: Every day | ORAL | Status: DC

## 2015-09-24 MED ORDER — BENZONATATE 100 MG PO CAPS: 100.0000 mg | ORAL_CAPSULE | Freq: Three times a day (TID) | ORAL | Status: DC | PRN

## 2015-09-24 MED ORDER — METOPROLOL SUCCINATE ER 50 MG PO TB24: 50.0000 mg | ORAL_TABLET | Freq: Every day | ORAL | Status: DC

## 2015-09-24 MED ORDER — GUAIFENESIN 100 MG/5ML PO SOLN
5.0000 mL | ORAL | Status: DC | PRN
  Administered 2015-09-24: 100 mg via ORAL

## 2015-09-24 MED ORDER — GUAIFENESIN 100 MG/5ML PO SOLN
ORAL | Status: AC
  Administered 2015-09-24: 100 mg via ORAL
  Filled 2015-09-24: qty 5

## 2015-09-24 MED FILL — Adenosine IV Soln 6 MG/2ML: INTRAVENOUS | Qty: 2 | Status: AC

## 2015-09-24 NOTE — Progress Notes (Signed)
DC instructions given to pt at this time.  Pt verbalized understanding.  No s/s of any acute distress or pain.  IV dc'd  Telemetry removed.  Central monitoring notified of pt dc

## 2015-09-24 NOTE — Discharge Instructions (Signed)

## 2015-10-09 DIAGNOSIS — R05 Cough: Secondary | ICD-10-CM | POA: Diagnosis not present

## 2015-10-09 DIAGNOSIS — J329 Chronic sinusitis, unspecified: Secondary | ICD-10-CM | POA: Diagnosis not present

## 2015-10-09 DIAGNOSIS — H6120 Impacted cerumen, unspecified ear: Secondary | ICD-10-CM | POA: Diagnosis not present

## 2015-10-22 ENCOUNTER — Encounter (HOSPITAL_COMMUNITY): Payer: Self-pay | Admitting: Nurse Practitioner

## 2015-10-22 ENCOUNTER — Ambulatory Visit: Payer: Medicare Other | Admitting: Internal Medicine

## 2015-10-22 ENCOUNTER — Ambulatory Visit (HOSPITAL_COMMUNITY)
Admission: RE | Admit: 2015-10-22 | Discharge: 2015-10-22 | Disposition: A | Discharge status: Home / Self Care | Payer: Medicare Other | Source: Ambulatory Visit | Attending: Nurse Practitioner | Admitting: Nurse Practitioner

## 2015-10-22 VITALS — BP 110/68 | HR 92 | Ht 63.0 in | Wt 170.8 lb

## 2015-10-22 DIAGNOSIS — I4891 Unspecified atrial fibrillation: Secondary | ICD-10-CM | POA: Diagnosis not present

## 2015-10-22 DIAGNOSIS — I480 Paroxysmal atrial fibrillation: Secondary | ICD-10-CM | POA: Diagnosis not present

## 2015-10-22 NOTE — Progress Notes (Signed)
Patient ID: Cheryl [name], female   DOB: 1945-08-15, 70 y.o.   MRN: [id number]     Primary Care Physician: Tawanna Solo, MD Referring Physician: Dr. Delman Cheadle is a 71 y.o. female with a h/o afib that is here for f/u ablation. She is doing well and has not noticed afib since her second ablation 2/20. She has been taking blood thinners without interruption. She had a sinus infection the first few weeks of the ablation and this made her feel a slightly weaker following procedure for she thinks she might have experienced, but is feeling better every day. No swallowing difficulties or rt groin pain voiced.  Today, she denies symptoms of palpitations, chest pain, shortness of breath, orthopnea, PND, lower extremity edema, dizziness, presyncope, syncope, or neurologic sequela. The patient is tolerating medications without difficulties and is otherwise without complaint today.   Past Medical History  Diagnosis Date  . Hypercholesteremia   . GERD (gastroesophageal reflux disease)   . Hypothyroidism   . Anemia   . Blood transfusion ~ 1962    " couple; no reaction " (05/30/2012)  . H/O hiatal hernia   . Arthritis   . Pacemaker 10/2011  . Myocardial infarction [facility])     "very very light" (05/30/2012)  . Ureteral obstruction, right   . Depression   . Coronary artery disease 2013    PCI PCI of OM  . PAF (paroxysmal atrial fibrillation) ([place]) 05/30/2012    s/p afib/flutter ablation  . Tachy-brady syndrome ([place]) 10/2011    a. post-termination pauses in setting of PAF;  b. 10/28/11 - St. Jude Dual Chamber PPM SN [id number]   . HTN (hypertension)    Past Surgical History  Procedure Laterality Date  . Tee without cardioversion  05/29/2012    Procedure: TRANSESOPHAGEAL ECHOCARDIOGRAM (TEE);  Surgeon: Sueanne Margarita, MD;  Location: [facility] ENDOSCOPY;  Service: Cardiovascular;  Laterality: N/A;  h/p in file drawer/dl  . Atrial fibrillation ablation  05/30/2012    PVI and CTi ablation by Dr  Rayann Heman  . Breast surgery  1990's    "right radial scar" (05/30/2012)  . Insert / replace / remove pacemaker  10/28/2011    initial placement  . Coronary angioplasty  ~ 04/2012  . Cystoscopy with retrograde pyelogram, ureteroscopy and stent placement  08/21/2012    Procedure: CYSTOSCOPY WITH RETROGRADE PYELOGRAM, URETEROSCOPY AND STENT PLACEMENT;  Surgeon: Franchot Gallo, MD;  Location: [facility] ORS;  Service: Urology;  Laterality: Right;  URETEROSCOPY WITH BIOPSY   . Permanent pacemaker insertion N/A 10/28/2011    Procedure: PERMANENT PACEMAKER INSERTION;  Surgeon: [name] Lance, MD;  Location: [facility] CATH LAB;  Service: Cardiovascular;  Laterality: N/A;  . Left heart catheterization with coronary angiogram N/A 04/04/2012    Procedure: LEFT HEART CATHETERIZATION WITH CORONARY ANGIOGRAM;  Surgeon: Sinclair Grooms, MD;  Location: [facility] CATH LAB;  Service: Cardiovascular;  Laterality: N/A;  . Percutaneous coronary intervention-balloon only Right 04/04/2012    Procedure: PERCUTANEOUS CORONARY INTERVENTION-BALLOON ONLY;  Surgeon: Sinclair Grooms, MD;  Location: [facility] CATH LAB;  Service: Cardiovascular;  Laterality: Right;  . Atrial fibrillation ablation N/A 05/30/2012    Procedure: ATRIAL FIBRILLATION ABLATION;  Surgeon: Thompson Grayer, MD;  Location: [facility] CATH LAB;  Service: Cardiovascular;  Laterality: N/A;  . Electrophysiologic study N/A 09/23/2015    Procedure: Atrial Fibrillation Ablation;  Surgeon: Thompson Grayer, MD;  Location: [place] CV LAB;  Service: Cardiovascular;  Laterality: N/A;    Current Outpatient Prescriptions  Medication Sig Dispense Refill  . benzonatate (TESSALON PERLES) 100 MG capsule Take 1 capsule (100 mg total) by mouth 3 (three) times daily as needed for cough. 20 capsule 0  . ELIQUIS 5 MG TABS tablet TAKE ONE TABLET BY MOUTH TWICE DAILY 60 tablet 6  . levothyroxine (SYNTHROID, LEVOTHROID) 100 MCG tablet Take 100 mcg by mouth daily before breakfast.     . lisinopril (PRINIVIL,ZESTRIL) 10  MG tablet Take 1 tablet (10 mg total) by mouth daily. 30 tablet 11  . metoprolol succinate (TOPROL-XL) 50 MG 24 hr tablet Take 1 tablet (50 mg total) by mouth daily. Take with or immediately following a meal. 30 tablet 3  . nitroGLYCERIN (NITROSTAT) 0.4 MG SL tablet Place 1 tablet (0.4 mg total) under the tongue every 5 (five) minutes x 3 doses as needed for chest pain. 25 tablet 5  . pantoprazole (PROTONIX) 40 MG tablet Take 1 tablet (40 mg total) by mouth daily. Take for 6 weeks then discontinue 45 tablet 0  . rosuvastatin (CRESTOR) 10 MG tablet Take 1 tablet (10 mg total) by mouth every evening. 30 tablet 11   No current facility-administered medications for this encounter.    Allergies  Allergen Reactions  . Latex Itching  . Protonix [Pantoprazole Sodium] Other (See Comments)    "think it caused me to have a fib"  . Codeine Nausea And Vomiting  . Tape Rash    "Paper tape is ok"    Social History   Social History  . Marital Status: Married    Spouse Name: N/A  . Number of Children: 4  . Years of Education: N/A   Occupational History  . Not on file.   Social History Main Topics  . Smoking status: Former Smoker -- 0.12 packs/day for 15 years    Types: Cigarettes    Quit date: 06/17/1977  . Smokeless tobacco: Never Used     Comment: QUIT smoking cigarettes 1978  . Alcohol Use: No  . Drug Use: No  . Sexual Activity: No   Other Topics Concern  . Not on file   Social History Narrative   Lives with husband.  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 Heart attack Father 33  . Heart attack Mother 60  . Cancer Mother     Died age 13 with pancreatic cancer, also had uterine and breast cancer    ROS- All systems are reviewed and negative except as per the HPI above  Physical Exam: Filed Vitals:   10/22/15 1110  BP: 110/68  Pulse: 92  Height: 5\' 3"  (1.6 m)  Weight: 170 lb 12.8 oz (77.474 kg)    GEN- The patient is  well appearing, alert and oriented x 3 today.   Head- normocephalic, atraumatic Eyes-  Sclera clear, conjunctiva pink Ears- hearing intact Oropharynx- clear Neck- supple, no JVP Lymph- no cervical lymphadenopathy Lungs- Clear to ausculation bilaterally, normal work of breathing Heart- Regular rate and rhythm, no murmurs, rubs or gallops, PMI not laterally displaced GI- soft, NT, ND, + BS Extremities- no clubbing, cyanosis, or edema MS- no significant deformity or atrophy Skin- no rash or lesion Psych- euthymic mood, full affect Neuro- strength and sensation are intact  EKG- NSR, at 92 bpm, pr int 156 ms, qrs int 72 ms, qtc 526 ms. Epic records reviewed  Assessment and Plan: 1. PAF S/p ablation and doing well without any afib or procedure related complications Continue  eliquis Continue metoprolol  2. HTN Stable  F/u with Dr. Rayann Heman 12/24/15  Geroge Baseman. Treyvin Glidden, [facility] [address] [place], [place] [address] [phone]

## 2015-12-19 ENCOUNTER — Encounter: Payer: Self-pay | Admitting: *Deleted

## 2015-12-24 ENCOUNTER — Ambulatory Visit (INDEPENDENT_AMBULATORY_CARE_PROVIDER_SITE_OTHER): Payer: Medicare Other | Admitting: Internal Medicine

## 2015-12-24 ENCOUNTER — Encounter: Payer: Self-pay | Admitting: Internal Medicine

## 2015-12-24 VITALS — BP 126/74 | HR 82 | Ht 63.0 in | Wt 170.8 lb

## 2015-12-24 DIAGNOSIS — I480 Paroxysmal atrial fibrillation: Secondary | ICD-10-CM | POA: Diagnosis not present

## 2015-12-24 DIAGNOSIS — I495 Sick sinus syndrome: Secondary | ICD-10-CM | POA: Diagnosis not present

## 2015-12-24 DIAGNOSIS — I10 Essential (primary) hypertension: Secondary | ICD-10-CM | POA: Diagnosis not present

## 2015-12-24 LAB — CUP PACEART INCLINIC DEVICE CHECK
Battery Remaining Longevity: 124.8
Brady Statistic RV Percent Paced: 0.11 %
Date Time Interrogation Session: 20170524132802
Implantable Lead Location: 753860
Lead Channel Impedance Value: 375 Ohm
Lead Channel Pacing Threshold Amplitude: 0.75 V
Lead Channel Pacing Threshold Amplitude: 1.5 V
Lead Channel Pacing Threshold Pulse Width: 0.5 ms
Lead Channel Pacing Threshold Pulse Width: 0.7 ms
Lead Channel Sensing Intrinsic Amplitude: 3.6 mV
Lead Channel Setting Pacing Amplitude: 2.5 V
MDC IDC LEAD IMPLANT DT: 20130328
MDC IDC LEAD IMPLANT DT: 20130328
MDC IDC LEAD LOCATION: 753859
MDC IDC MSMT BATTERY VOLTAGE: 2.95 V
MDC IDC MSMT LEADCHNL RA PACING THRESHOLD AMPLITUDE: 0.75 V
MDC IDC MSMT LEADCHNL RA PACING THRESHOLD PULSEWIDTH: 0.5 ms
MDC IDC MSMT LEADCHNL RV IMPEDANCE VALUE: 362.5 Ohm
MDC IDC MSMT LEADCHNL RV PACING THRESHOLD AMPLITUDE: 1.5 V
MDC IDC MSMT LEADCHNL RV PACING THRESHOLD PULSEWIDTH: 0.7 ms
MDC IDC MSMT LEADCHNL RV SENSING INTR AMPL: 5.2 mV
MDC IDC PG SERIAL: 7334985
MDC IDC SET LEADCHNL RA PACING AMPLITUDE: 1.75 V
MDC IDC SET LEADCHNL RV PACING PULSEWIDTH: 0.7 ms
MDC IDC SET LEADCHNL RV SENSING SENSITIVITY: 2 mV
MDC IDC STAT BRADY RA PERCENT PACED: 3.3 %
Pulse Gen Model: 2210

## 2015-12-24 NOTE — Patient Instructions (Signed)
Medication Instructions:  Your physician recommends that you continue on your current medications as directed. Please refer to the Current Medication list given to you today.   Labwork: None ordered   Testing/Procedures: None ordered   Follow-Up:  Your physician recommends that you schedule a follow-up appointment in: 3 months with Donna Carroll, NP and 6 months with Dr Allred   Any Other Special Instructions Will Be Listed Below (If Applicable).     If you need a refill on your cardiac medications before your next appointment, please call your pharmacy.   

## 2015-12-24 NOTE — Progress Notes (Signed)
PCP: Tawanna Solo, MD Primary Cardiologist:  [name] is a 70 y.o. female who presents today for routine electrophysiology followup. Doing well s/p ablation without recurrence.  Denies procedure related complications and is pleased with progress.  Today, she denies symptoms of chest pain, shortness of breath,  lower extremity edema, presyncope, or syncope.   The patient is otherwise without complaint today.   Past Medical History  Diagnosis Date  . Hypercholesteremia   . GERD (gastroesophageal reflux disease)   . Hypothyroidism   . Anemia   . Blood transfusion ~ 1962    " couple; no reaction " (05/30/2012)  . H/O hiatal hernia   . Arthritis   . Pacemaker 10/2011  . Myocardial infarction [facility])     "very very light" (05/30/2012)  . Ureteral obstruction, right   . Depression   . Coronary artery disease 2013    PCI PCI of OM  . PAF (paroxysmal atrial fibrillation) ([place]) 05/30/2012    s/p afib/flutter ablation  . Tachy-brady syndrome ([place]) 10/2011    a. post-termination pauses in setting of PAF;  b. 10/28/11 - St. Jude Dual Chamber PPM SN [id number]   . HTN (hypertension)    Past Surgical History  Procedure Laterality Date  . Tee without cardioversion  05/29/2012    Procedure: TRANSESOPHAGEAL ECHOCARDIOGRAM (TEE);  Surgeon: Sueanne Margarita, MD;  Location: [facility] ENDOSCOPY;  Service: Cardiovascular;  Laterality: N/A;  h/p in file drawer/dl  . Atrial fibrillation ablation  05/30/2012    PVI and CTi ablation by Dr Rayann Heman  . Breast surgery  1990's    "right radial scar" (05/30/2012)  . Insert / replace / remove pacemaker  10/28/2011    initial placement  . Coronary angioplasty  ~ 04/2012  . Cystoscopy with retrograde pyelogram, ureteroscopy and stent placement  08/21/2012    Procedure: CYSTOSCOPY WITH RETROGRADE PYELOGRAM, URETEROSCOPY AND STENT PLACEMENT;  Surgeon: Franchot Gallo, MD;  Location: [facility] ORS;  Service: Urology;  Laterality: Right;  URETEROSCOPY WITH BIOPSY   .  Permanent pacemaker insertion N/A 10/28/2011    Procedure: PERMANENT PACEMAKER INSERTION;  Surgeon: [name] Lance, MD;  Location: [facility] CATH LAB;  Service: Cardiovascular;  Laterality: N/A;  . Left heart catheterization with coronary angiogram N/A 04/04/2012    Procedure: LEFT HEART CATHETERIZATION WITH CORONARY ANGIOGRAM;  Surgeon: Sinclair Grooms, MD;  Location: [facility] CATH LAB;  Service: Cardiovascular;  Laterality: N/A;  . Percutaneous coronary intervention-balloon only Right 04/04/2012    Procedure: PERCUTANEOUS CORONARY INTERVENTION-BALLOON ONLY;  Surgeon: Sinclair Grooms, MD;  Location: [facility] CATH LAB;  Service: Cardiovascular;  Laterality: Right;  . Atrial fibrillation ablation N/A 05/30/2012    Procedure: ATRIAL FIBRILLATION ABLATION;  Surgeon: Thompson Grayer, MD;  Location: [facility] CATH LAB;  Service: Cardiovascular;  Laterality: N/A;  . Electrophysiologic study N/A 09/23/2015    Procedure: Atrial Fibrillation Ablation;  Surgeon: Thompson Grayer, MD;  Location: [place] CV LAB;  Service: Cardiovascular;  Laterality: N/A;    Current Outpatient Prescriptions  Medication Sig Dispense Refill  . benzonatate (TESSALON PERLES) 100 MG capsule Take 1 capsule (100 mg total) by mouth 3 (three) times daily as needed for cough. 20 capsule 0  . ELIQUIS 5 MG TABS tablet TAKE ONE TABLET BY MOUTH TWICE DAILY 60 tablet 6  . levothyroxine (SYNTHROID, LEVOTHROID) 100 MCG tablet Take 100 mcg by mouth daily before breakfast.     . lisinopril (PRINIVIL,ZESTRIL) 10 MG tablet Take 1 tablet (10 mg total) by mouth  daily. 30 tablet 11  . metoprolol succinate (TOPROL-XL) 50 MG 24 hr tablet Take 1 tablet (50 mg total) by mouth daily. Take with or immediately following a meal. 30 tablet 3  . nitroGLYCERIN (NITROSTAT) 0.4 MG SL tablet Place 1 tablet (0.4 mg total) under the tongue every 5 (five) minutes x 3 doses as needed for chest pain. 25 tablet 5  . rosuvastatin (CRESTOR) 10 MG tablet Take 1 tablet (10 mg total) by mouth every  evening. 30 tablet 11   No current facility-administered medications for this visit.   ROS- all systems are reviewed and negative except as per HPI above  Physical Exam: Filed Vitals:   12/24/15 0930  BP: 126/74  Pulse: 82  Height: 5\' 3"  (1.6 m)  Weight: 170 lb 12.8 oz (77.474 kg)    GEN- The patient is anxious appearing, alert and oriented x 3 today.   Head- normocephalic, atraumatic Eyes-  Sclera clear, conjunctiva pink Ears- hearing intact Oropharynx- clear Lungs- Clear to ausculation bilaterally, normal work of breathing Heart- Regular rate and rhythm, no murmurs, rubs or gallops, PMI not laterally displaced GI- soft, NT, ND, + BS Extremities- no clubbing, cyanosis, or edema  Pacemaker interrogation today is reviewed, see paceart  Assessment and Plan:  1. afib Doing well post ablation off AAD therapy [id number] score is at least 3 Continue eliquis  2. Tachy/brady syndrome Normal pacemaker function See Pace Art report No changes today  3. HTN Stable No change required today  Follow-up with Dr Radford Pax as scheduled Follow-up in AF clinic in 3 months I will see in 6 months  Thompson Grayer MD, [facility] 12/24/2015 10:06 AM

## 2016-01-12 ENCOUNTER — Encounter: Payer: Self-pay | Admitting: Cardiology

## 2016-01-12 ENCOUNTER — Ambulatory Visit (INDEPENDENT_AMBULATORY_CARE_PROVIDER_SITE_OTHER): Payer: Medicare Other | Admitting: Cardiology

## 2016-01-12 VITALS — BP 126/68 | HR 80 | Ht 64.0 in | Wt 172.6 lb

## 2016-01-12 DIAGNOSIS — I495 Sick sinus syndrome: Secondary | ICD-10-CM

## 2016-01-12 DIAGNOSIS — I480 Paroxysmal atrial fibrillation: Secondary | ICD-10-CM

## 2016-01-12 DIAGNOSIS — I2583 Coronary atherosclerosis due to lipid rich plaque: Secondary | ICD-10-CM

## 2016-01-12 DIAGNOSIS — E7800 Pure hypercholesterolemia, unspecified: Secondary | ICD-10-CM

## 2016-01-12 DIAGNOSIS — I10 Essential (primary) hypertension: Secondary | ICD-10-CM

## 2016-01-12 DIAGNOSIS — I2510 Atherosclerotic heart disease of native coronary artery without angina pectoris: Secondary | ICD-10-CM

## 2016-01-12 MED ORDER — ROSUVASTATIN CALCIUM 10 MG PO TABS: 10.0000 mg | ORAL_TABLET | Freq: Every day | ORAL | Status: DC

## 2016-01-12 NOTE — Progress Notes (Signed)
Cardiology Office Note    Date:  01/12/2016   ID:  Cheryl [name], DOB 23-Dec-1945, MRN [id number]  PCP:  Tawanna Solo, MD  Cardiologist:  Fransico Him, MD   Chief Complaint  Patient presents with  . Coronary Artery Disease  . Hypertension  . Atrial Fibrillation  . Hyperlipidemia    History of Present Illness:  Cheryl [name] is a 70 y.o. female with a history of PAF/flutter s/p ablation, tachy/brady syndrome s/p PPM, HTN, ASCAD and dyslipidemia who presents today for followup. Since I saw her last she has had another afib ablation in February and has done well since then.  She is doing well. She denies any chest pain, SOB, DOE, dizziness or syncope. Occasionally she will have some ankle edema in the evening. She denies any claudication.    Past Medical History  Diagnosis Date  . Hypercholesteremia   . GERD (gastroesophageal reflux disease)   . Hypothyroidism   . Anemia   . Blood transfusion ~ 1962    " couple; no reaction " (05/30/2012)  . H/O hiatal hernia   . Arthritis   . Pacemaker 10/2011  . Myocardial infarction [facility])     "very very light" (05/30/2012)  . Ureteral obstruction, right   . Depression   . Coronary artery disease 2013    PCI PCI of OM  . PAF (paroxysmal atrial fibrillation) ([place]) 05/30/2012    s/p afib/flutter ablation 2013/2017  . Tachy-brady syndrome ([place]) 10/2011    a. post-termination pauses in setting of PAF;  b. 10/28/11 - St. Jude Dual Chamber PPM SN [id number]   . HTN (hypertension)     Past Surgical History  Procedure Laterality Date  . Tee without cardioversion  05/29/2012    Procedure: TRANSESOPHAGEAL ECHOCARDIOGRAM (TEE);  Surgeon: Sueanne Margarita, MD;  Location: [facility] ENDOSCOPY;  Service: Cardiovascular;  Laterality: N/A;  h/p in file drawer/dl  . Atrial fibrillation ablation  05/30/2012    PVI and CTi ablation by Dr Rayann Heman  . Breast surgery  1990's    "right radial scar" (05/30/2012)  . Insert / replace / remove pacemaker  10/28/2011   initial placement  . Coronary angioplasty  ~ 04/2012  . Cystoscopy with retrograde pyelogram, ureteroscopy and stent placement  08/21/2012    Procedure: CYSTOSCOPY WITH RETROGRADE PYELOGRAM, URETEROSCOPY AND STENT PLACEMENT;  Surgeon: Franchot Gallo, MD;  [facility] ORS;  Service: Urology;  Laterality: Right;  URETEROSCOPY WITH BIOPSY   . Permanent pacemaker insertion N/A 10/28/2011    Procedure: PERMANENT PACEMAKER INSERTION;  Surgeon: [name] Lance, MD;  Location: [facility] CATH LAB;  Service: Cardiovascular;  Laterality: N/A;  . Left heart catheterization with coronary angiogram N/A 04/04/2012    Procedure: LEFT HEART CATHETERIZATION WITH CORONARY ANGIOGRAM;  Surgeon: Sinclair Grooms, MD;  Location: [facility] CATH LAB;  Service: Cardiovascular;  Laterality: N/A;  . Percutaneous coronary intervention-balloon only Right 04/04/2012    Procedure: PERCUTANEOUS CORONARY INTERVENTION-BALLOON ONLY;  Surgeon: Sinclair Grooms, MD;  Location: [facility] CATH LAB;  Service: Cardiovascular;  Laterality: Right;  . Atrial fibrillation ablation N/A 05/30/2012    Procedure: ATRIAL FIBRILLATION ABLATION;  Surgeon: Thompson Grayer, MD;  Location: [facility] CATH LAB;  Service: Cardiovascular;  Laterality: N/A;  . Electrophysiologic study N/A 09/23/2015    Procedure: Atrial Fibrillation Ablation;  Surgeon: Thompson Grayer, MD;  Location: [place] CV LAB;  Service: Cardiovascular;  Laterality: N/A;    Current Medications: Outpatient Prescriptions Prior to Visit  Medication Sig Dispense Refill  .  benzonatate (TESSALON PERLES) 100 MG capsule Take 1 capsule (100 mg total) by mouth 3 (three) times daily as needed for cough. 20 capsule 0  . ELIQUIS 5 MG TABS tablet TAKE ONE TABLET BY MOUTH TWICE DAILY 60 tablet 6  . levothyroxine (SYNTHROID, LEVOTHROID) 100 MCG tablet Take 100 mcg by mouth daily before breakfast.     . lisinopril (PRINIVIL,ZESTRIL) 10 MG tablet Take 1 tablet (10 mg total) by mouth daily. 30 tablet 11  . metoprolol succinate  (TOPROL-XL) 50 MG 24 hr tablet Take 1 tablet (50 mg total) by mouth daily. Take with or immediately following a meal. 30 tablet 3  . nitroGLYCERIN (NITROSTAT) 0.4 MG SL tablet Place 1 tablet (0.4 mg total) under the tongue every 5 (five) minutes x 3 doses as needed for chest pain. 25 tablet 5  . rosuvastatin (CRESTOR) 10 MG tablet Take 1 tablet (10 mg total) by mouth every evening. 30 tablet 11   No facility-administered medications prior to visit.     Allergies:   Latex; Protonix; Codeine; and Tape   Social History   Social History  . Marital Status: Married    Spouse Name: N/A  . Number of Children: 4  . Years of Education: N/A   Social History Main Topics  . Smoking status: Former Smoker -- 0.12 packs/day for 15 years    Types: Cigarettes    Quit date: 06/17/1977  . Smokeless tobacco: Never Used     Comment: QUIT smoking cigarettes 1978  . Alcohol Use: No  . Drug Use: No  . Sexual Activity: No   Other Topics Concern  . Not on file   Social History Narrative   Lives with husband.  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     Family History:  The patient's family history includes Breast cancer in her mother; Heart attack (age of onset: 4) in her father; Heart attack (age of onset: 53) in her mother; Pancreatic cancer in her mother; Uterine cancer in her mother.   ROS:   Please see the history of present illness.    ROS All other systems reviewed and are negative.   PHYSICAL EXAM:   VS:  BP 126/68 mmHg  Pulse 80  Ht 5\' 4"  (1.626 m)  Wt 172 lb 9.6 oz (78.291 kg)  BMI 29.61 kg/m2  SpO2 98%   GEN: Well nourished, well developed, in no acute distress HEENT: normal Neck: no JVD, carotid bruits, or masses Cardiac: RRR; no murmurs, rubs, or gallops,no edema.  Intact distal pulses bilaterally.  Respiratory:  clear to auscultation bilaterally, normal work of breathing GI: soft, nontender, nondistended, + BS MS: no deformity or atrophy Skin: warm and dry, no rash Neuro:   Alert and Oriented x 3, Strength and sensation are intact Psych: euthymic mood, full affect  Wt Readings from Last 3 Encounters:  01/12/16 172 lb 9.6 oz (78.291 kg)  12/24/15 170 lb 12.8 oz (77.474 kg)  10/22/15 170 lb 12.8 oz (77.474 kg)      Studies/Labs Reviewed:   EKG:  EKG is not ordered today.   Recent Labs: 02/10/2015: ALT 12 09/15/2015: BUN 13; Creat 0.92; Hemoglobin 13.8; Platelets 303; Potassium 3.9; Sodium 140   Lipid Panel    Component Value Date/Time   CHOL 136 02/10/2015 0845   TRIG 80.0 02/10/2015 0845   HDL 48.80 02/10/2015 0845   CHOLHDL 3 02/10/2015 0845   VLDL 16.0 02/10/2015 0845   LDLCALC 71 02/10/2015 0845    Additional studies/ records  that were reviewed today include:  none    ASSESSMENT:    1. PAF (paroxysmal atrial fibrillation) ([place])   2. Tachy-brady syndrome ([place])   3. Coronary artery disease due to lipid rich plaque   4. Essential hypertension   5. Hypercholesteremia      PLAN:  In order of problems listed above:  1. PAF - maintaining NSR s/p afib ablation.  Continue BB/eliquis. 2. Tachy-brady syndrome s/p PPM 3. ASCAD s/p PCI of OM with no angina.  Continue BB and statin.  No ASA due to NOAC. 4. HTN - BP controlled on current medical regimen.  Continue BB and ACE I.  5. Hyperlipidemia with LDL goal < 70.  Continue statin.  Check FLP and ALT from PCP.  She wants to try to switch to generic crestor due to cost so I will order this.  I will get an FLp and ALT in 8 weeks.     Medication Adjustments/Labs and Tests Ordered: Current medicines are reviewed at length with the patient today.  Concerns regarding medicines are outlined above.  Medication changes, Labs and Tests ordered today are listed in the Patient Instructions below.  There are no Patient Instructions on file for this visit.   Signed, Fransico Him, MD  01/12/2016 10:57 AM    [facility] [place], [place], [place]  [address] Phone: [phone]; Fax: [phone]

## 2016-01-12 NOTE — Addendum Note (Signed)
Addended by: Harland German A on: 01/12/2016 11:52 AM   Modules accepted: Orders

## 2016-01-12 NOTE — Patient Instructions (Signed)
Medication Instructions:  You have been ordered generic Crestor.   Labwork: Your physician recommends that you return for FASTING lab work in: 8 weeks.   Testing/Procedures: None  Follow-Up: Your physician wants you to follow-up in: 6 months with Dr. Radford Pax. You will receive a reminder letter in the mail two months in advance. If you don't receive a letter, please call our office to schedule the follow-up appointment.   Any Other Special Instructions Will Be Listed Below (If Applicable).     If you need a refill on your cardiac medications before your next appointment, please call your pharmacy.

## 2016-01-19 ENCOUNTER — Other Ambulatory Visit: Payer: Self-pay | Admitting: *Deleted

## 2016-01-19 DIAGNOSIS — Z0000 Encounter for general adult medical examination without abnormal findings: Secondary | ICD-10-CM | POA: Diagnosis not present

## 2016-01-19 DIAGNOSIS — E039 Hypothyroidism, unspecified: Secondary | ICD-10-CM | POA: Diagnosis not present

## 2016-01-19 DIAGNOSIS — E785 Hyperlipidemia, unspecified: Secondary | ICD-10-CM | POA: Diagnosis not present

## 2016-01-19 DIAGNOSIS — I10 Essential (primary) hypertension: Secondary | ICD-10-CM | POA: Diagnosis not present

## 2016-01-19 DIAGNOSIS — Z13820 Encounter for screening for osteoporosis: Secondary | ICD-10-CM | POA: Diagnosis not present

## 2016-01-19 MED ORDER — METOPROLOL SUCCINATE ER 50 MG PO TB24: 50.0000 mg | ORAL_TABLET | Freq: Every day | ORAL | Status: DC

## 2016-01-21 DIAGNOSIS — M25561 Pain in right knee: Secondary | ICD-10-CM | POA: Diagnosis not present

## 2016-01-23 DIAGNOSIS — I10 Essential (primary) hypertension: Secondary | ICD-10-CM | POA: Diagnosis not present

## 2016-01-23 DIAGNOSIS — E039 Hypothyroidism, unspecified: Secondary | ICD-10-CM | POA: Diagnosis not present

## 2016-01-23 DIAGNOSIS — Z0000 Encounter for general adult medical examination without abnormal findings: Secondary | ICD-10-CM | POA: Diagnosis not present

## 2016-01-23 DIAGNOSIS — E785 Hyperlipidemia, unspecified: Secondary | ICD-10-CM | POA: Diagnosis not present

## 2016-01-30 ENCOUNTER — Encounter: Payer: Self-pay | Admitting: Cardiology

## 2016-02-12 ENCOUNTER — Telehealth: Payer: Self-pay

## 2016-02-12 MED ORDER — ROSUVASTATIN CALCIUM 20 MG PO TABS: 20.0000 mg | ORAL_TABLET | Freq: Every day | ORAL | Status: DC

## 2016-02-12 NOTE — Telephone Encounter (Signed)
Instructed patient to INCREASE CRESTOR to 20 mg daily. FLP and LFTs rescheduled to September 1. Patient agrees with treatment plan.

## 2016-02-12 NOTE — Telephone Encounter (Signed)
-----   Message from Sueanne Margarita, MD sent at 02/04/2016  9:51 PM EDT ----- LDL not at goal - increase Crestor to 20mg  daily and repeat FLP and ALT in 6 weeks

## 2016-02-28 ENCOUNTER — Other Ambulatory Visit: Payer: Self-pay | Admitting: Cardiology

## 2016-03-03 DIAGNOSIS — M170 Bilateral primary osteoarthritis of knee: Secondary | ICD-10-CM | POA: Diagnosis not present

## 2016-03-15 ENCOUNTER — Other Ambulatory Visit: Payer: Medicare Other

## 2016-03-24 ENCOUNTER — Encounter: Payer: Medicare Other | Admitting: *Deleted

## 2016-03-24 ENCOUNTER — Telehealth: Payer: Self-pay | Admitting: Cardiology

## 2016-03-24 NOTE — Telephone Encounter (Signed)
Spoke with pt and reminded pt of remote transmission that is due today. Pt verbalized understanding.   

## 2016-03-26 ENCOUNTER — Ambulatory Visit (HOSPITAL_COMMUNITY): Payer: Medicare Other | Admitting: Nurse Practitioner

## 2016-03-26 ENCOUNTER — Encounter: Payer: Self-pay | Admitting: Cardiology

## 2016-03-29 ENCOUNTER — Ambulatory Visit (INDEPENDENT_AMBULATORY_CARE_PROVIDER_SITE_OTHER): Payer: Medicare Other | Admitting: *Deleted

## 2016-03-29 DIAGNOSIS — I495 Sick sinus syndrome: Secondary | ICD-10-CM | POA: Diagnosis not present

## 2016-03-30 ENCOUNTER — Ambulatory Visit (HOSPITAL_COMMUNITY)
Admission: RE | Admit: 2016-03-30 | Discharge: 2016-03-30 | Disposition: A | Discharge status: Home / Self Care | Payer: Medicare Other | Source: Ambulatory Visit | Attending: Nurse Practitioner | Admitting: Nurse Practitioner

## 2016-03-30 ENCOUNTER — Encounter (HOSPITAL_COMMUNITY): Payer: Self-pay | Admitting: Nurse Practitioner

## 2016-03-30 ENCOUNTER — Other Ambulatory Visit: Payer: Medicare Other | Admitting: *Deleted

## 2016-03-30 VITALS — BP 124/82 | HR 74 | Ht 64.0 in | Wt 173.8 lb

## 2016-03-30 DIAGNOSIS — Z885 Allergy status to narcotic agent status: Secondary | ICD-10-CM | POA: Insufficient documentation

## 2016-03-30 DIAGNOSIS — Z87891 Personal history of nicotine dependence: Secondary | ICD-10-CM | POA: Diagnosis not present

## 2016-03-30 DIAGNOSIS — E7800 Pure hypercholesterolemia, unspecified: Secondary | ICD-10-CM | POA: Diagnosis not present

## 2016-03-30 DIAGNOSIS — I2510 Atherosclerotic heart disease of native coronary artery without angina pectoris: Secondary | ICD-10-CM | POA: Insufficient documentation

## 2016-03-30 DIAGNOSIS — I495 Sick sinus syndrome: Secondary | ICD-10-CM | POA: Insufficient documentation

## 2016-03-30 DIAGNOSIS — I4891 Unspecified atrial fibrillation: Secondary | ICD-10-CM | POA: Diagnosis present

## 2016-03-30 DIAGNOSIS — I480 Paroxysmal atrial fibrillation: Secondary | ICD-10-CM | POA: Insufficient documentation

## 2016-03-30 DIAGNOSIS — Z950 Presence of cardiac pacemaker: Secondary | ICD-10-CM | POA: Diagnosis not present

## 2016-03-30 DIAGNOSIS — E039 Hypothyroidism, unspecified: Secondary | ICD-10-CM | POA: Diagnosis not present

## 2016-03-30 DIAGNOSIS — Z79899 Other long term (current) drug therapy: Secondary | ICD-10-CM | POA: Insufficient documentation

## 2016-03-30 DIAGNOSIS — Z8249 Family history of ischemic heart disease and other diseases of the circulatory system: Secondary | ICD-10-CM | POA: Insufficient documentation

## 2016-03-30 DIAGNOSIS — Z9861 Coronary angioplasty status: Secondary | ICD-10-CM | POA: Diagnosis not present

## 2016-03-30 DIAGNOSIS — I252 Old myocardial infarction: Secondary | ICD-10-CM | POA: Insufficient documentation

## 2016-03-30 DIAGNOSIS — I10 Essential (primary) hypertension: Secondary | ICD-10-CM | POA: Diagnosis not present

## 2016-03-30 DIAGNOSIS — Z91040 Latex allergy status: Secondary | ICD-10-CM | POA: Diagnosis not present

## 2016-03-30 DIAGNOSIS — Z7901 Long term (current) use of anticoagulants: Secondary | ICD-10-CM | POA: Insufficient documentation

## 2016-03-30 DIAGNOSIS — Z888 Allergy status to other drugs, medicaments and biological substances status: Secondary | ICD-10-CM | POA: Diagnosis not present

## 2016-03-30 DIAGNOSIS — K219 Gastro-esophageal reflux disease without esophagitis: Secondary | ICD-10-CM | POA: Insufficient documentation

## 2016-03-30 DIAGNOSIS — I2583 Coronary atherosclerosis due to lipid rich plaque: Principal | ICD-10-CM

## 2016-03-30 DIAGNOSIS — M1990 Unspecified osteoarthritis, unspecified site: Secondary | ICD-10-CM | POA: Insufficient documentation

## 2016-03-30 LAB — LIPID PANEL
CHOLESTEROL: 177 mg/dL (ref 125–200)
HDL: 86 mg/dL (ref >=46)
LDL Cholesterol: 78 mg/dL (ref <130)
Total CHOL/HDL Ratio: 2.1 Ratio (ref <=5.0)
Triglycerides: 63 mg/dL (ref <150)
VLDL: 13 mg/dL (ref <30)

## 2016-03-30 LAB — HEPATIC FUNCTION PANEL
ALBUMIN: 4.1 g/dL (ref 3.6–5.1)
ALT: 12 U/L (ref 6–29)
AST: 15 U/L (ref 10–35)
Alkaline Phosphatase: 19 U/L — ABNORMAL LOW (ref 33–130)
Bilirubin, Direct: 0.1 mg/dL (ref <=0.2)
Indirect Bilirubin: 0.5 mg/dL (ref 0.2–1.2)
Total Bilirubin: 0.6 mg/dL (ref 0.2–1.2)
Total Protein: 6.9 g/dL (ref 6.1–8.1)

## 2016-03-30 NOTE — Progress Notes (Signed)
Patient ID: Cheryl [name], female   DOB: [birth year], 70 y.o.   MRN: [id number]     Primary Care Physician: Tawanna Solo, MD Referring Physician: Dr. Delman Cheadle is a 70 y.o. female with a h/o afib that is here for f/u ablation. She is doing well and has not noticed afib since her second ablation 2/20. She has been taking blood thinners without interruption.   Today, she denies symptoms of palpitations, chest pain, shortness of breath, orthopnea, PND, lower extremity edema, dizziness, presyncope, syncope, or neurologic sequela. The patient is tolerating medications without difficulties and is otherwise without complaint today.   Past Medical History:  Diagnosis Date  . Anemia   . Arthritis   . Blood transfusion ~ 1962   " couple; no reaction " (05/30/2012)  . Coronary artery disease 2013   PCI PCI of OM  . Depression   . GERD (gastroesophageal reflux disease)   . H/O hiatal hernia   . HTN (hypertension)   . Hypercholesteremia   . Hypothyroidism   . Myocardial infarction [facility])    "very very light" (05/30/2012)  . Pacemaker 10/2011  . PAF (paroxysmal atrial fibrillation) ([place]) 05/30/2012   s/p afib/flutter ablation 2013/2017  . Tachy-brady syndrome ([place]) 10/2011   a. post-termination pauses in setting of PAF;  b. 10/28/11 - St. Jude Dual Chamber PPM SN [id number]   . Ureteral obstruction, right    Past Surgical History:  Procedure Laterality Date  . ATRIAL FIBRILLATION ABLATION  05/30/2012   PVI and CTi ablation by Dr Rayann Heman  . ATRIAL FIBRILLATION ABLATION N/A 05/30/2012   Procedure: ATRIAL FIBRILLATION ABLATION;  Surgeon: Thompson Grayer, MD;  Location: [facility] CATH LAB;  Service: Cardiovascular;  Laterality: N/A;  . BREAST SURGERY  1990's   "right radial scar" (05/30/2012)  . CORONARY ANGIOPLASTY  ~ 04/2012  . CYSTOSCOPY WITH RETROGRADE PYELOGRAM, URETEROSCOPY AND STENT PLACEMENT  08/21/2012   Procedure: CYSTOSCOPY WITH RETROGRADE PYELOGRAM, URETEROSCOPY AND STENT  PLACEMENT;  Surgeon: Franchot Gallo, MD;  [facility] ORS;  Service: Urology;  Laterality: Right;  URETEROSCOPY WITH BIOPSY   . ELECTROPHYSIOLOGIC STUDY N/A 09/23/2015   Procedure: Atrial Fibrillation Ablation;  Surgeon: Thompson Grayer, MD;  Location: [place] CV LAB;  Service: Cardiovascular;  Laterality: N/A;  . INSERT / REPLACE / REMOVE PACEMAKER  10/28/2011   initial placement  . LEFT HEART CATHETERIZATION WITH CORONARY ANGIOGRAM N/A 04/04/2012   Procedure: LEFT HEART CATHETERIZATION WITH CORONARY ANGIOGRAM;  Surgeon: Sinclair Grooms, MD;  Location: [facility] CATH LAB;  Service: Cardiovascular;  Laterality: N/A;  . PERCUTANEOUS CORONARY INTERVENTION-BALLOON ONLY Right 04/04/2012   Procedure: PERCUTANEOUS CORONARY INTERVENTION-BALLOON ONLY;  Surgeon: Sinclair Grooms, MD;  Location: [facility] CATH LAB;  Service: Cardiovascular;  Laterality: Right;  . PERMANENT PACEMAKER INSERTION N/A 10/28/2011   Procedure: PERMANENT PACEMAKER INSERTION;  Surgeon: [name] Lance, MD;  Location: [facility] CATH LAB;  Service: Cardiovascular;  Laterality: N/A;  . TEE WITHOUT CARDIOVERSION  05/29/2012   Procedure: TRANSESOPHAGEAL ECHOCARDIOGRAM (TEE);  Surgeon: Sueanne Margarita, MD;  Location: [facility] ENDOSCOPY;  Service: Cardiovascular;  Laterality: N/A;  h/p in file drawer/dl    Current Outpatient Prescriptions  Medication Sig Dispense Refill  . ELIQUIS 5 MG TABS tablet TAKE ONE TABLET BY MOUTH TWICE DAILY 60 tablet 10  . levothyroxine (SYNTHROID, LEVOTHROID) 100 MCG tablet Take 100 mcg by mouth daily before breakfast.     . lisinopril (PRINIVIL,ZESTRIL) 10 MG tablet Take 1 tablet (10 mg total) by mouth  daily. 30 tablet 11  . metoprolol succinate (TOPROL-XL) 50 MG 24 hr tablet Take 1 tablet (50 mg total) by mouth daily. Take with or immediately following a meal. 90 tablet 3  . nitroGLYCERIN (NITROSTAT) 0.4 MG SL tablet Place 1 tablet (0.4 mg total) under the tongue every 5 (five) minutes x 3 doses as needed for chest pain. 25 tablet 5    . rosuvastatin (CRESTOR) 20 MG tablet Take 1 tablet (20 mg total) by mouth daily. 90 tablet 3   No current facility-administered medications for this encounter.     Allergies  Allergen Reactions  . Latex Itching  . Protonix [Pantoprazole Sodium] Other (See Comments)    "think it caused me to have a fib"  . Codeine Nausea And Vomiting  . Tape Rash    "Paper tape is ok"    Social History   Social History  . Marital status: Married    Spouse name: N/A  . Number of children: 4  . Years of education: N/A   Occupational History  . Not on file.   Social History Main Topics  . Smoking status: Former Smoker    Packs/day: 0.12    Years: 15.00    Types: Cigarettes    Quit date: 06/17/1977  . Smokeless tobacco: Never Used     Comment: QUIT smoking cigarettes 1978  . Alcohol use No  . Drug use: No  . Sexual activity: No   Other Topics Concern  . Not on file   Social History Narrative   Lives with husband.  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 Heart attack Father 53  . Heart attack Mother 87  . Pancreatic cancer Mother   . Uterine cancer Mother   . Breast cancer Mother     ROS- All systems are reviewed and negative except as per the HPI above  Physical Exam: Vitals:   03/30/16 1007  BP: 124/82  Pulse: 74  Weight: 173 lb 12.8 oz (78.8 kg)  Height: 5\' 4"  (1.626 m)    GEN- The patient is well appearing, alert and oriented x 3 today.   Head- normocephalic, atraumatic Eyes-  Sclera clear, conjunctiva pink Ears- hearing intact Oropharynx- clear Neck- supple, no JVP Lymph- no cervical lymphadenopathy Lungs- Clear to ausculation bilaterally, normal work of breathing Heart- Regular rate and rhythm, no murmurs, rubs or gallops, PMI not laterally displaced GI- soft, NT, ND, + BS Extremities- no clubbing, cyanosis, or edema MS- no significant deformity or atrophy Skin- no rash or lesion Psych- euthymic mood,  full affect Neuro- strength and sensation are intact  EKG- NSR, at74 bpm, pr int 154 ms, qrs int 78 ms, qtc 428 ms. Epic records reviewed  Assessment and Plan: 1. PAF S/p ablation and doing well without any afib Continue eliquis Continue metoprolol  2. HTN Stable  F/u with Dr. Rayann Heman 11/17  Geroge Baseman. Nirav Sweda, [facility] [address] [place], [place] [address] [phone]

## 2016-03-30 NOTE — Progress Notes (Signed)
Remote pacemaker transmission.   

## 2016-04-01 ENCOUNTER — Encounter: Payer: Self-pay | Admitting: Cardiology

## 2016-04-01 ENCOUNTER — Telehealth: Payer: Self-pay | Admitting: Cardiology

## 2016-04-01 DIAGNOSIS — E7800 Pure hypercholesterolemia, unspecified: Secondary | ICD-10-CM

## 2016-04-01 MED ORDER — ROSUVASTATIN CALCIUM 40 MG PO TABS: 40.0000 mg | ORAL_TABLET | Freq: Every day | ORAL | 11 refills | Status: DC

## 2016-04-01 NOTE — Telephone Encounter (Signed)
New message ° ° ° ° ° ° °Pt returning nurse call  °

## 2016-04-01 NOTE — Telephone Encounter (Signed)
-----   Message from Sueanne Margarita, MD sent at 03/31/2016  9:01 AM EDT ----- Increase crestor to 40mg  daily and repeat FLP and ALT in 6 weeks

## 2016-04-01 NOTE — Telephone Encounter (Signed)
Informed patient of results and verbal understanding expressed.   Instructed patient to INCREASE CRESTOR to 40 mg daily. FLP and ALT scheduled October 16. Patient agrees with treatment plan.

## 2016-04-02 ENCOUNTER — Telehealth: Payer: Self-pay | Admitting: Internal Medicine

## 2016-04-02 ENCOUNTER — Other Ambulatory Visit: Payer: Medicare Other

## 2016-04-02 NOTE — Telephone Encounter (Signed)
Spoke w/ pt and informed her that her remote transmission was received. Pt verbalized understanding.  

## 2016-04-02 NOTE — Telephone Encounter (Signed)
F/u  Patient is returning a call regarding her remote transmission

## 2016-04-09 LAB — CUP PACEART REMOTE DEVICE CHECK
Battery Remaining Longevity: 90 mo
Brady Statistic RA Percent Paced: 9.9 %
Brady Statistic RV Percent Paced: 1 % — CL
Implantable Lead Implant Date: 20130328
Implantable Lead Implant Date: 20130328
Implantable Lead Location: 753859
Lead Channel Impedance Value: 350 Ohm
Lead Channel Pacing Threshold Amplitude: 0.625 V
Lead Channel Pacing Threshold Pulse Width: 0.5 ms
Lead Channel Setting Pacing Amplitude: 2.5 V
Lead Channel Setting Sensing Sensitivity: 2 mV
MDC IDC LEAD LOCATION: 753860
MDC IDC MSMT BATTERY REMAINING PERCENTAGE: 95 % — AB
MDC IDC MSMT BATTERY VOLTAGE: 2.96 V
MDC IDC MSMT LEADCHNL RA IMPEDANCE VALUE: 360 Ohm
MDC IDC MSMT LEADCHNL RA SENSING INTR AMPL: 3.7 mV
MDC IDC MSMT LEADCHNL RV SENSING INTR AMPL: 4.3 mV
MDC IDC SESS DTM: 20170908132437
MDC IDC SET LEADCHNL RA PACING AMPLITUDE: 1.625
MDC IDC SET LEADCHNL RV PACING PULSEWIDTH: 0.7 ms
Pulse Gen Serial Number: 7334985

## 2016-04-16 ENCOUNTER — Encounter: Payer: Self-pay | Admitting: Cardiology

## 2016-04-27 DIAGNOSIS — J069 Acute upper respiratory infection, unspecified: Secondary | ICD-10-CM | POA: Diagnosis not present

## 2016-04-27 DIAGNOSIS — R21 Rash and other nonspecific skin eruption: Secondary | ICD-10-CM | POA: Diagnosis not present

## 2016-05-17 ENCOUNTER — Other Ambulatory Visit: Payer: Medicare Other

## 2016-05-21 ENCOUNTER — Other Ambulatory Visit: Payer: Medicare Other | Admitting: *Deleted

## 2016-05-21 DIAGNOSIS — E7800 Pure hypercholesterolemia, unspecified: Secondary | ICD-10-CM | POA: Diagnosis not present

## 2016-05-21 LAB — LIPID PANEL
CHOL/HDL RATIO: 2 ratio (ref <=5.0)
Cholesterol: 142 mg/dL (ref 125–200)
HDL: 70 mg/dL (ref >=46)
LDL CALC: 60 mg/dL (ref <130)
TRIGLYCERIDES: 59 mg/dL (ref <150)
VLDL: 12 mg/dL (ref <30)

## 2016-05-21 LAB — ALT: ALT: 10 U/L (ref 6–29)

## 2016-06-08 DIAGNOSIS — H40033 Anatomical narrow angle, bilateral: Secondary | ICD-10-CM | POA: Diagnosis not present

## 2016-06-08 DIAGNOSIS — H04123 Dry eye syndrome of bilateral lacrimal glands: Secondary | ICD-10-CM | POA: Diagnosis not present

## 2016-06-21 ENCOUNTER — Encounter: Payer: Self-pay | Admitting: Internal Medicine

## 2016-06-21 ENCOUNTER — Ambulatory Visit (INDEPENDENT_AMBULATORY_CARE_PROVIDER_SITE_OTHER): Payer: Medicare Other | Admitting: Internal Medicine

## 2016-06-21 VITALS — BP 118/80 | HR 80 | Ht 63.0 in | Wt 177.4 lb

## 2016-06-21 DIAGNOSIS — I2510 Atherosclerotic heart disease of native coronary artery without angina pectoris: Secondary | ICD-10-CM | POA: Diagnosis not present

## 2016-06-21 DIAGNOSIS — I2583 Coronary atherosclerosis due to lipid rich plaque: Secondary | ICD-10-CM | POA: Diagnosis not present

## 2016-06-21 DIAGNOSIS — I119 Hypertensive heart disease without heart failure: Secondary | ICD-10-CM | POA: Diagnosis not present

## 2016-06-21 DIAGNOSIS — I480 Paroxysmal atrial fibrillation: Secondary | ICD-10-CM | POA: Diagnosis not present

## 2016-06-21 DIAGNOSIS — I495 Sick sinus syndrome: Secondary | ICD-10-CM | POA: Diagnosis not present

## 2016-06-21 LAB — CUP PACEART INCLINIC DEVICE CHECK
Brady Statistic RA Percent Paced: 9.3 %
Brady Statistic RV Percent Paced: 0.07 %
Implantable Lead Implant Date: 20130328
Implantable Lead Location: 753860
Lead Channel Impedance Value: 362.5 Ohm
Lead Channel Impedance Value: 412.5 Ohm
Lead Channel Pacing Threshold Pulse Width: 0.5 ms
Lead Channel Sensing Intrinsic Amplitude: 4.6 mV
MDC IDC LEAD IMPLANT DT: 20130328
MDC IDC LEAD LOCATION: 753859
MDC IDC MSMT BATTERY VOLTAGE: 2.95 V
MDC IDC MSMT LEADCHNL RA PACING THRESHOLD AMPLITUDE: 0.75 V
MDC IDC MSMT LEADCHNL RV PACING THRESHOLD AMPLITUDE: 1.25 V
MDC IDC MSMT LEADCHNL RV PACING THRESHOLD PULSEWIDTH: 0.7 ms
MDC IDC MSMT LEADCHNL RV SENSING INTR AMPL: 5.7 mV
MDC IDC PG IMPLANT DT: 20130328
MDC IDC SESS DTM: 20171120171638
MDC IDC SET LEADCHNL RA PACING AMPLITUDE: 1.75 V
MDC IDC SET LEADCHNL RV PACING AMPLITUDE: 2.5 V
MDC IDC SET LEADCHNL RV PACING PULSEWIDTH: 0.7 ms
MDC IDC SET LEADCHNL RV SENSING SENSITIVITY: 2 mV
Pulse Gen Serial Number: 7334985

## 2016-06-21 MED ORDER — LISINOPRIL 10 MG PO TABS: 10.0000 mg | ORAL_TABLET | Freq: Every day | ORAL | 3 refills | Status: DC

## 2016-06-21 NOTE — Progress Notes (Signed)
PCP: Gerrit Heck, MD Primary Cardiologist:  [name] is a 70 y.o. female who presents today for routine electrophysiology followup.  No afib since her last visit.  Today, she denies symptoms of chest pain, shortness of breath,  lower extremity edema, presyncope, or syncope.   The patient is otherwise without complaint today.   Past Medical History:  Diagnosis Date  . Anemia   . Arthritis   . Blood transfusion ~ 1962   " couple; no reaction " (05/30/2012)  . Coronary artery disease 2013   PCI PCI of OM  . Depression   . GERD (gastroesophageal reflux disease)   . H/O hiatal hernia   . HTN (hypertension)   . Hypercholesteremia   . Hypothyroidism   . Myocardial infarction    "very very light" (05/30/2012)  . Pacemaker 10/2011  . PAF (paroxysmal atrial fibrillation) ([place]) 05/30/2012   s/p afib/flutter ablation 2013/2017  . Tachy-brady syndrome ([place]) 10/2011   a. post-termination pauses in setting of PAF;  b. 10/28/11 - St. Jude Dual Chamber PPM SN [id number]   . Ureteral obstruction, right    Past Surgical History:  Procedure Laterality Date  . ATRIAL FIBRILLATION ABLATION  05/30/2012   PVI and CTi ablation by Dr Rayann Heman  . ATRIAL FIBRILLATION ABLATION N/A 05/30/2012   Procedure: ATRIAL FIBRILLATION ABLATION;  Surgeon: Thompson Grayer, MD;  Location: [facility] CATH LAB;  Service: Cardiovascular;  Laterality: N/A;  . BREAST SURGERY  1990's   "right radial scar" (05/30/2012)  . CORONARY ANGIOPLASTY  ~ 04/2012  . CYSTOSCOPY WITH RETROGRADE PYELOGRAM, URETEROSCOPY AND STENT PLACEMENT  08/21/2012   Procedure: CYSTOSCOPY WITH RETROGRADE PYELOGRAM, URETEROSCOPY AND STENT PLACEMENT;  Surgeon: Franchot Gallo, MD;  Location: [facility] ORS;  Service: Urology;  Laterality: Right;  URETEROSCOPY WITH BIOPSY   . ELECTROPHYSIOLOGIC STUDY N/A 09/23/2015   Procedure: Atrial Fibrillation Ablation;  Surgeon: Thompson Grayer, MD;  Location: [facility] CV LAB;  Service: Cardiovascular;   Laterality: N/A;  . INSERT / REPLACE / REMOVE PACEMAKER  10/28/2011   initial placement  . LEFT HEART CATHETERIZATION WITH CORONARY ANGIOGRAM N/A 04/04/2012   Procedure: LEFT HEART CATHETERIZATION WITH CORONARY ANGIOGRAM;  Surgeon: Sinclair Grooms, MD;  Location: [facility] CATH LAB;  Service: Cardiovascular;  Laterality: N/A;  . PERCUTANEOUS CORONARY INTERVENTION-BALLOON ONLY Right 04/04/2012   Procedure: PERCUTANEOUS CORONARY INTERVENTION-BALLOON ONLY;  Surgeon: Sinclair Grooms, MD;  Location: [facility] CATH LAB;  Service: Cardiovascular;  Laterality: Right;  . PERMANENT PACEMAKER INSERTION N/A 10/28/2011   Procedure: PERMANENT PACEMAKER INSERTION;  Surgeon: [name] Lance, MD;  Location: [facility] CATH LAB;  Service: Cardiovascular;  Laterality: N/A;  . TEE WITHOUT CARDIOVERSION  05/29/2012   Procedure: TRANSESOPHAGEAL ECHOCARDIOGRAM (TEE);  Surgeon: Sueanne Margarita, MD;  Location: [facility] ENDOSCOPY;  Service: Cardiovascular;  Laterality: N/A;  h/p in file drawer/dl    Current Outpatient Prescriptions  Medication Sig Dispense Refill  . ELIQUIS 5 MG TABS tablet TAKE ONE TABLET BY MOUTH TWICE DAILY 60 tablet 10  . levothyroxine (SYNTHROID, LEVOTHROID) 100 MCG tablet Take 100 mcg by mouth daily before breakfast.     . lisinopril (PRINIVIL,ZESTRIL) 10 MG tablet Take 1 tablet (10 mg total) by mouth daily. 90 tablet 3  . metoprolol succinate (TOPROL-XL) 50 MG 24 hr tablet Take 1 tablet (50 mg total) by mouth daily. Take with or immediately following a meal. 90 tablet 3  . nitroGLYCERIN (NITROSTAT) 0.4 MG SL tablet Place 1 tablet (0.4 mg total) under the  tongue every 5 (five) minutes x 3 doses as needed for chest pain. 25 tablet 5  . rosuvastatin (CRESTOR) 40 MG tablet Take 1 tablet (40 mg total) by mouth daily. 30 tablet 11   No current facility-administered medications for this visit.    ROS- all systems are reviewed and negative except as per HPI above  Physical Exam: Vitals:   06/21/16 1550  BP: 118/80  Pulse: 80    Weight: 177 lb 6.4 oz (80.5 kg)  Height: 5\' 3"  (1.6 m)    GEN- The patient is well appearing, alert and oriented x 3 today.   Head- normocephalic, atraumatic Eyes-  Sclera clear, conjunctiva pink Ears- hearing intact Oropharynx- clear Lungs- Clear to ausculation bilaterally, normal work of breathing Heart- Regular rate and rhythm, no murmurs, rubs or gallops, PMI not laterally displaced GI- soft, NT, ND, + BS Extremities- no clubbing, cyanosis, or edema  Pacemaker interrogation today is reviewed, see paceart  Assessment and Plan:  1. afib Doing well post ablation off AAD therapy without recurrence chads2vasc score is at least 3 Continue eliquis  2. Tachy/brady syndrome Normal pacemaker function See Pace Art report No changes today  3. HTN Stable No change required today  Follow-up with Dr Radford Pax as scheduled I will see in 6 months  Thompson Grayer MD, [facility] 06/21/2016 4:27 PM

## 2016-06-21 NOTE — Patient Instructions (Signed)

## 2016-06-30 DIAGNOSIS — M170 Bilateral primary osteoarthritis of knee: Secondary | ICD-10-CM | POA: Diagnosis not present

## 2016-07-07 DIAGNOSIS — M170 Bilateral primary osteoarthritis of knee: Secondary | ICD-10-CM | POA: Diagnosis not present

## 2016-07-08 ENCOUNTER — Encounter: Payer: Self-pay | Admitting: Cardiology

## 2016-07-14 ENCOUNTER — Ambulatory Visit (INDEPENDENT_AMBULATORY_CARE_PROVIDER_SITE_OTHER): Payer: Medicare Other | Admitting: Cardiology

## 2016-07-14 VITALS — BP 112/84 | HR 78 | Ht 63.0 in | Wt 178.0 lb

## 2016-07-14 DIAGNOSIS — I495 Sick sinus syndrome: Secondary | ICD-10-CM

## 2016-07-14 DIAGNOSIS — I480 Paroxysmal atrial fibrillation: Secondary | ICD-10-CM

## 2016-07-14 DIAGNOSIS — E7800 Pure hypercholesterolemia, unspecified: Secondary | ICD-10-CM

## 2016-07-14 DIAGNOSIS — I2583 Coronary atherosclerosis due to lipid rich plaque: Secondary | ICD-10-CM | POA: Diagnosis not present

## 2016-07-14 DIAGNOSIS — I2510 Atherosclerotic heart disease of native coronary artery without angina pectoris: Secondary | ICD-10-CM

## 2016-07-14 DIAGNOSIS — I10 Essential (primary) hypertension: Secondary | ICD-10-CM | POA: Diagnosis not present

## 2016-07-14 DIAGNOSIS — M170 Bilateral primary osteoarthritis of knee: Secondary | ICD-10-CM | POA: Diagnosis not present

## 2016-07-14 LAB — CBC WITH DIFFERENTIAL/PLATELET
BASOS ABS: 57 {cells}/uL (ref 0–200)
BASOS PCT: 1 %
EOS ABS: 114 {cells}/uL (ref 15–500)
Eosinophils Relative: 2 %
HCT: 41.3 % (ref 35.0–45.0)
HEMOGLOBIN: 13.4 g/dL (ref 11.7–15.5)
LYMPHS ABS: 2508 {cells}/uL (ref 850–3900)
Lymphocytes Relative: 44 %
MCH: 28.3 pg (ref 27.0–33.0)
MCHC: 32.4 g/dL (ref 32.0–36.0)
MCV: 87.3 fL (ref 80.0–100.0)
MONO ABS: 399 {cells}/uL (ref 200–950)
MONOS PCT: 7 %
MPV: 9.2 fL (ref 7.5–12.5)
NEUTROS ABS: 2622 {cells}/uL (ref 1500–7800)
Neutrophils Relative %: 46 %
PLATELETS: 286 10*3/uL (ref 140–400)
RBC: 4.73 MIL/uL (ref 3.80–5.10)
RDW: 13.1 % (ref 11.0–15.0)
WBC: 5.7 10*3/uL (ref 3.8–10.8)

## 2016-07-14 LAB — BASIC METABOLIC PANEL
BUN: 13 mg/dL (ref 7–25)
CHLORIDE: 103 mmol/L (ref 98–110)
CO2: 27 mmol/L (ref 20–31)
CREATININE: 0.81 mg/dL (ref 0.50–0.99)
Calcium: 9.2 mg/dL (ref 8.6–10.4)
Glucose, Bld: 90 mg/dL (ref 65–99)
POTASSIUM: 4 mmol/L (ref 3.5–5.3)
SODIUM: 139 mmol/L (ref 135–146)

## 2016-07-14 NOTE — Progress Notes (Signed)
Cardiology Office Note    Date:  07/14/2016   ID:  Cheryl [name], DOB [birth year], MRN [id number]  PCP:  Gerrit Heck, MD  Cardiologist:  Fransico Him, MD   Chief Complaint  Patient presents with  . Coronary Artery Disease  . Hypertension  . Atrial Fibrillation  . Hyperlipidemia    History of Present Illness:  Cheryl [name] is a 70 y.o. female with a history of PAF/flutter s/p ablation, tachy/brady syndrome s/p PPM, HTN, ASCAD and dyslipidemia who presents today for followup. Since I saw her last she has had another afib ablation in February and has done well since then.  She is doing well. She denies any chest pain, SOB, DOE, dizziness or syncope. Occasionally she will have some ankle edema in the evening. She denies any claudication.  She has had no further afib or palpitations.      Past Medical History:  Diagnosis Date  . Anemia   . Arthritis   . Blood transfusion ~ 1962   " couple; no reaction " (05/30/2012)  . Coronary artery disease 2013   PCI PCI of OM  . Depression   . GERD (gastroesophageal reflux disease)   . H/O hiatal hernia   . HTN (hypertension)   . Hypercholesteremia   . Hypothyroidism   . Myocardial infarction    "very very light" (05/30/2012)  . Pacemaker 10/2011  . PAF (paroxysmal atrial fibrillation) ([place]) 05/30/2012   s/p afib/flutter ablation 2013/2017  . Tachy-brady syndrome ([place]) 10/2011   a. post-termination pauses in setting of PAF;  b. 10/28/11 - St. Jude Dual Chamber PPM SN [id number]   . Ureteral obstruction, right     Past Surgical History:  Procedure Laterality Date  . ATRIAL FIBRILLATION ABLATION  05/30/2012   PVI and CTi ablation by Dr Rayann Heman  . ATRIAL FIBRILLATION ABLATION N/A 05/30/2012   Procedure: ATRIAL FIBRILLATION ABLATION;  Surgeon: Thompson Grayer, MD;  Location: [facility] CATH LAB;  Service: Cardiovascular;  Laterality: N/A;  . BREAST SURGERY  1990's   "right radial scar" (05/30/2012)  . CORONARY ANGIOPLASTY  ~ 04/2012    . CYSTOSCOPY WITH RETROGRADE PYELOGRAM, URETEROSCOPY AND STENT PLACEMENT  08/21/2012   Procedure: CYSTOSCOPY WITH RETROGRADE PYELOGRAM, URETEROSCOPY AND STENT PLACEMENT;  Surgeon: Franchot Gallo, MD;  [facility]S;  Service: Urology;  Laterality: Right;  URETEROSCOPY WITH BIOPSY   . ELECTROPHYSIOLOGIC STUDY N/A 09/23/2015   Procedure: Atrial Fibrillation Ablation;  Surgeon: Thompson Grayer, MD;  Location: [place] CV LAB;  Service: Cardiovascular;  Laterality: N/A;  . INSERT / REPLACE / REMOVE PACEMAKER  10/28/2011   initial placement  . LEFT HEART CATHETERIZATION WITH CORONARY ANGIOGRAM N/A 04/04/2012   Procedure: LEFT HEART CATHETERIZATION WITH CORONARY ANGIOGRAM;  Surgeon: Sinclair Grooms, MD;  Location: [facility] CATH LAB;  Service: Cardiovascular;  Laterality: N/A;  . PERCUTANEOUS CORONARY INTERVENTION-BALLOON ONLY Right 04/04/2012   Procedure: PERCUTANEOUS CORONARY INTERVENTION-BALLOON ONLY;  Surgeon: Sinclair Grooms, MD;  Location: [facility] CATH LAB;  Service: Cardiovascular;  Laterality: Right;  . PERMANENT PACEMAKER INSERTION N/A 10/28/2011   Procedure: PERMANENT PACEMAKER INSERTION;  Surgeon: [name] Lance, MD;  Location: [facility] CATH LAB;  Service: Cardiovascular;  Laterality: N/A;  . TEE WITHOUT CARDIOVERSION  05/29/2012   Procedure: TRANSESOPHAGEAL ECHOCARDIOGRAM (TEE);  Surgeon: Sueanne Margarita, MD;  Location: [facility] ENDOSCOPY;  Service: Cardiovascular;  Laterality: N/A;  h/p in file drawer/dl    Current Medications: Outpatient Medications Prior to Visit  Medication Sig Dispense Refill  . ELIQUIS  5 MG TABS tablet TAKE ONE TABLET BY MOUTH TWICE DAILY 60 tablet 10  . levothyroxine (SYNTHROID, LEVOTHROID) 100 MCG tablet Take 100 mcg by mouth daily before breakfast.     . lisinopril (PRINIVIL,ZESTRIL) 10 MG tablet Take 1 tablet (10 mg total) by mouth daily. 90 tablet 3  . metoprolol succinate (TOPROL-XL) 50 MG 24 hr tablet Take 1 tablet (50 mg total) by mouth daily. Take with or immediately following  a meal. 90 tablet 3  . nitroGLYCERIN (NITROSTAT) 0.4 MG SL tablet Place 1 tablet (0.4 mg total) under the tongue every 5 (five) minutes x 3 doses as needed for chest pain. 25 tablet 5  . rosuvastatin (CRESTOR) 40 MG tablet Take 1 tablet (40 mg total) by mouth daily. 30 tablet 11   No facility-administered medications prior to visit.      Allergies:   Latex; Protonix [pantoprazole sodium]; Codeine; and Tape   Social History   Social History  . Marital status: Married    Spouse name: N/A  . Number of children: 4  . Years of education: N/A   Social History Main Topics  . Smoking status: Former Smoker    Packs/day: 0.12    Years: 15.00    Types: Cigarettes    Quit date: 06/17/1977  . Smokeless tobacco: Never Used     Comment: QUIT smoking cigarettes 1978  . Alcohol use No  . Drug use: No  . Sexual activity: No   Other Topics Concern  . Not on file   Social History Narrative   Lives with husband.  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     Family History:  The patient's family history includes Breast cancer in her mother; Heart attack (age of onset: 27) in her father; Heart attack (age of onset: 64) in her mother; Pancreatic cancer in her mother; Uterine cancer in her mother.   ROS:   Please see the history of present illness.    ROS All other systems reviewed and are negative.  No flowsheet data found.     PHYSICAL EXAM:   VS:  BP 112/84   Pulse 78   Ht 5\' 3"  (1.6 m)   Wt 178 lb (80.7 kg)   BMI 31.53 kg/m    GEN: Well nourished, well developed, in no acute distress  HEENT: normal  Neck: no JVD, carotid bruits, or masses Cardiac: RRR; no murmurs, rubs, or gallops,no edema.  Intact distal pulses bilaterally.  Respiratory:  clear to auscultation bilaterally, normal work of breathing GI: soft, nontender, nondistended, + BS MS: no deformity or atrophy  Skin: warm and dry, no rash Neuro:  Alert and Oriented x 3, Strength and sensation are intact Psych: euthymic  mood, full affect  Wt Readings from Last 3 Encounters:  07/14/16 178 lb (80.7 kg)  06/21/16 177 lb 6.4 oz (80.5 kg)  03/30/16 173 lb 12.8 oz (78.8 kg)      Studies/Labs Reviewed:   EKG:  EKG is not ordered today.   Recent Labs: 09/15/2015: BUN 13; Creat 0.92; Hemoglobin 13.8; Platelets 303; Potassium 3.9; Sodium 140 05/21/2016: ALT 10   Lipid Panel    Component Value Date/Time   CHOL 142 05/21/2016 1100   TRIG 59 05/21/2016 1100   HDL 70 05/21/2016 1100   CHOLHDL 2.0 05/21/2016 1100   VLDL 12 05/21/2016 1100   LDLCALC 60 05/21/2016 1100    Additional studies/ records that were reviewed today include:   none    ASSESSMENT:  1. PAF (paroxysmal atrial fibrillation) ([place])   2. Coronary artery disease involving native coronary artery of native heart without angina pectoris   3. Essential hypertension   4. Tachy-brady syndrome ([place])   5. Hypercholesteremia      PLAN:  In order of problems listed above:  1. Persistent atrial fibrillation s/p afib ablation with no reoccurrence. Continue eliquis and BB.  Check NOAC panel today.  2. ASCAD s/p remote PCI OM.  She is not on ASA due to Eliquis.  Continue statin/BB.  3. HTN - BP controlled on current meds.  Continue BB and ACE I.  4. Tachybrady syndrome s/p PPM followed in our pacer clinic. 5. Hyperlipidemia - LDL goal < 70.  Continue statin.  LDL a few months ago was 60.      Medication Adjustments/Labs and Tests Ordered: Current medicines are reviewed at length with the patient today.  Concerns regarding medicines are outlined above.  Medication changes, Labs and Tests ordered today are listed in the Patient Instructions below.  There are no Patient Instructions on file for this visit.   Signed, Fransico Him, MD  07/14/2016 11:11 AM    [address], [place], [place]  [address] Phone: [phone]; Fax: [phone]

## 2016-07-14 NOTE — Patient Instructions (Signed)

## 2016-07-16 ENCOUNTER — Telehealth: Payer: Self-pay | Admitting: Cardiology

## 2016-07-16 NOTE — Telephone Encounter (Signed)
New message ° ° ° ° °Returning a call to the nurse to get lab results °

## 2016-07-16 NOTE — Telephone Encounter (Signed)
Notified of lab results. 

## 2016-10-27 DIAGNOSIS — M1712 Unilateral primary osteoarthritis, left knee: Secondary | ICD-10-CM | POA: Diagnosis not present

## 2016-12-10 ENCOUNTER — Ambulatory Visit (INDEPENDENT_AMBULATORY_CARE_PROVIDER_SITE_OTHER): Payer: Medicare Other | Admitting: Cardiology

## 2016-12-10 ENCOUNTER — Encounter: Payer: Self-pay | Admitting: Cardiology

## 2016-12-10 ENCOUNTER — Ambulatory Visit (INDEPENDENT_AMBULATORY_CARE_PROVIDER_SITE_OTHER): Payer: Medicare Other | Admitting: *Deleted

## 2016-12-10 VITALS — BP 124/76 | HR 81 | Resp 16 | Ht 63.0 in | Wt 179.0 lb

## 2016-12-10 DIAGNOSIS — I10 Essential (primary) hypertension: Secondary | ICD-10-CM

## 2016-12-10 DIAGNOSIS — R42 Dizziness and giddiness: Secondary | ICD-10-CM | POA: Diagnosis not present

## 2016-12-10 DIAGNOSIS — I495 Sick sinus syndrome: Secondary | ICD-10-CM

## 2016-12-10 DIAGNOSIS — E7800 Pure hypercholesterolemia, unspecified: Secondary | ICD-10-CM

## 2016-12-10 DIAGNOSIS — I2510 Atherosclerotic heart disease of native coronary artery without angina pectoris: Secondary | ICD-10-CM | POA: Diagnosis not present

## 2016-12-10 DIAGNOSIS — I4819 Other persistent atrial fibrillation: Secondary | ICD-10-CM

## 2016-12-10 DIAGNOSIS — I481 Persistent atrial fibrillation: Secondary | ICD-10-CM

## 2016-12-10 LAB — CUP PACEART INCLINIC DEVICE CHECK
Brady Statistic RA Percent Paced: 11 %
Brady Statistic RV Percent Paced: 0.07 %
Implantable Lead Implant Date: 20130328
Implantable Lead Location: 753859
Lead Channel Impedance Value: 350 Ohm
Lead Channel Pacing Threshold Amplitude: 0.75 V
Lead Channel Pacing Threshold Amplitude: 0.75 V
Lead Channel Pacing Threshold Amplitude: 1.25 V
Lead Channel Pacing Threshold Pulse Width: 0.5 ms
Lead Channel Pacing Threshold Pulse Width: 0.7 ms
Lead Channel Sensing Intrinsic Amplitude: 5 mV
MDC IDC LEAD IMPLANT DT: 20130328
MDC IDC LEAD LOCATION: 753860
MDC IDC MSMT BATTERY VOLTAGE: 2.95 V
MDC IDC MSMT LEADCHNL RA IMPEDANCE VALUE: 350 Ohm
MDC IDC MSMT LEADCHNL RA PACING THRESHOLD PULSEWIDTH: 0.5 ms
MDC IDC MSMT LEADCHNL RV PACING THRESHOLD AMPLITUDE: 1.25 V
MDC IDC MSMT LEADCHNL RV PACING THRESHOLD PULSEWIDTH: 0.7 ms
MDC IDC MSMT LEADCHNL RV SENSING INTR AMPL: 5.2 mV
MDC IDC PG IMPLANT DT: 20130328
MDC IDC PG SERIAL: 7334985
MDC IDC SESS DTM: 20180511140158
MDC IDC SET LEADCHNL RA PACING AMPLITUDE: 1.75 V
MDC IDC SET LEADCHNL RV PACING AMPLITUDE: 2.5 V
MDC IDC SET LEADCHNL RV PACING PULSEWIDTH: 0.7 ms
MDC IDC SET LEADCHNL RV SENSING SENSITIVITY: 2 mV

## 2016-12-10 NOTE — Progress Notes (Signed)
Cardiology Office Note    Date:  12/10/2016   ID:  [place], Gerstel [birth year], MRN [id number]  PCP:  Leighton Ruff, MD  Cardiologist:  Fransico Him, MD   Chief Complaint  Patient presents with  . Coronary Artery Disease  . Hypertension  . Atrial Fibrillation  . Hyperlipidemia    History of Present Illness:  Cheryl [name] is a 71 y.o. female  with a history of PAF/flutter s/p ablation x 2, tachy/brady syndrome s/p PPM, HTN, ASCAD and dyslipidemia who presents today for followup.She is doing well. She denies any chest pain or pressure, PND, orthopnea or claudication. She has been having problems with DOE over the past few weeks and exertional fatigue. She says that the other day she was in the kitchen talking on the phone and suddenly had dizziness followed by diaphoresis, palpitations for a few seconds and then her hands felt heavy. She laid on the couch and it subsided. She has not had any syncopal episodes.    Past Medical History:  Diagnosis Date  . Anemia   . Arthritis   . Blood transfusion ~ 1962   " couple; no reaction " (05/30/2012)  . Coronary artery disease 2013   PCI PCI of OM  . Depression   . GERD (gastroesophageal reflux disease)   . H/O hiatal hernia   . HTN (hypertension)   . Hypercholesteremia   . Hypothyroidism   . Myocardial infarction [facility])    "very very light" (05/30/2012)  . Pacemaker 10/2011  . PAF (paroxysmal atrial fibrillation) ([place]) 05/30/2012   s/p afib/flutter ablation 2013/2017  . Tachy-brady syndrome ([place]) 10/2011   a. post-termination pauses in setting of PAF;  b. 10/28/11 - St. Jude Dual Chamber PPM SN [id number]   . Ureteral obstruction, right     Past Surgical History:  Procedure Laterality Date  . ATRIAL FIBRILLATION ABLATION  05/30/2012   PVI and CTi ablation by Dr Rayann Heman  . ATRIAL FIBRILLATION ABLATION N/A 05/30/2012   Procedure: ATRIAL FIBRILLATION ABLATION;  Surgeon: Thompson Grayer, MD;  Location: [facility] CATH LAB;  Service:  Cardiovascular;  Laterality: N/A;  . BREAST SURGERY  1990's   "right radial scar" (05/30/2012)  . CORONARY ANGIOPLASTY  ~ 04/2012  . CYSTOSCOPY WITH RETROGRADE PYELOGRAM, URETEROSCOPY AND STENT PLACEMENT  08/21/2012   Procedure: CYSTOSCOPY WITH RETROGRADE PYELOGRAM, URETEROSCOPY AND STENT PLACEMENT;  Surgeon: Franchot Gallo, MD;  [facility] ORS;  Service: Urology;  Laterality: Right;  URETEROSCOPY WITH BIOPSY   . ELECTROPHYSIOLOGIC STUDY N/A 09/23/2015   Procedure: Atrial Fibrillation Ablation;  Surgeon: Thompson Grayer, MD;  Location: [place] CV LAB;  Service: Cardiovascular;  Laterality: N/A;  . INSERT / REPLACE / REMOVE PACEMAKER  10/28/2011   initial placement  . LEFT HEART CATHETERIZATION WITH CORONARY ANGIOGRAM N/A 04/04/2012   Procedure: LEFT HEART CATHETERIZATION WITH CORONARY ANGIOGRAM;  Surgeon: Sinclair Grooms, MD;  Location: [facility] CATH LAB;  Service: Cardiovascular;  Laterality: N/A;  . PERCUTANEOUS CORONARY INTERVENTION-BALLOON ONLY Right 04/04/2012   Procedure: PERCUTANEOUS CORONARY INTERVENTION-BALLOON ONLY;  Surgeon: Sinclair Grooms, MD;  Location: [facility] CATH LAB;  Service: Cardiovascular;  Laterality: Right;  . PERMANENT PACEMAKER INSERTION N/A 10/28/2011   Procedure: PERMANENT PACEMAKER INSERTION;  Surgeon: [name] Lance, MD;  Location: [facility] CATH LAB;  Service: Cardiovascular;  Laterality: N/A;  . TEE WITHOUT CARDIOVERSION  05/29/2012   Procedure: TRANSESOPHAGEAL ECHOCARDIOGRAM (TEE);  Surgeon: Sueanne Margarita, MD;  Location: [facility] ENDOSCOPY;  Service: Cardiovascular;  Laterality: N/A;  h/p  in file drawer/dl    Current Medications: Current Meds  Medication Sig  . ELIQUIS 5 MG TABS tablet TAKE ONE TABLET BY MOUTH TWICE DAILY  . levothyroxine (SYNTHROID, LEVOTHROID) 100 MCG tablet Take 100 mcg by mouth daily before breakfast.   . lisinopril (PRINIVIL,ZESTRIL) 10 MG tablet Take 1 tablet (10 mg total) by mouth daily.  . metoprolol succinate (TOPROL-XL) 50 MG 24 hr tablet Take 1 tablet  (50 mg total) by mouth daily. Take with or immediately following a meal.  . rosuvastatin (CRESTOR) 40 MG tablet Take 1 tablet (40 mg total) by mouth daily.    Allergies:   Latex; Protonix [pantoprazole sodium]; Codeine; and Tape   Social History   Social History  . Marital status: Married    Spouse name: N/A  . Number of children: 4  . Years of education: N/A   Social History Main Topics  . Smoking status: Former Smoker    Packs/day: 0.12    Years: 15.00    Types: Cigarettes    Quit date: 06/17/1977  . Smokeless tobacco: Never Used     Comment: QUIT smoking cigarettes 1978  . Alcohol use No  . Drug use: No  . Sexual activity: No   Other Topics Concern  . None   Social History Narrative   Lives with husband.  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     Family History:  The patient's family history includes Breast cancer in her mother; Heart attack (age of onset: 76) in her father; Heart attack (age of onset: 33) in her mother; Pancreatic cancer in her mother; Uterine cancer in her mother.   ROS:   Please see the history of present illness.    ROS All other systems reviewed and are negative.  No flowsheet data found.   Orthostatic VS for the past 24 hrs:  BP- Lying Pulse- Lying BP- Sitting Pulse- Sitting BP- Standing at 0 minutes Pulse- Standing at 0 minutes  12/10/16 1123 136/78 79 124/74 80 118/82 86  12/10/16 1122 136/78 79 124/74 80 118/82 86      PHYSICAL EXAM:   VS:  BP 124/76   Pulse 81   Resp 16   Ht 5\' 3"  (1.6 m)   Wt 179 lb (81.2 kg)   SpO2 96%   BMI 31.71 kg/m    GEN: Well nourished, well developed, in no acute distress  HEENT: normal  Neck: no JVD, carotid bruits, or masses Cardiac: RRR; no murmurs, rubs, or gallops,no edema.  Intact distal pulses bilaterally.  Respiratory:  clear to auscultation bilaterally, normal work of breathing GI: soft, nontender, nondistended, + BS MS: no deformity or atrophy  Skin: warm and dry, no rash Neuro:  Alert  and Oriented x 3, Strength and sensation are intact Psych: euthymic mood, full affect  Wt Readings from Last 3 Encounters:  12/10/16 179 lb (81.2 kg)  07/14/16 178 lb (80.7 kg)  06/21/16 177 lb 6.4 oz (80.5 kg)      Studies/Labs Reviewed:   EKG:  EKG is ordered today.  The ekg ordered today demonstrates NSR at 79bpm with nonspecific T wave abnormality  Recent Labs: 05/21/2016: ALT 10 07/14/2016: BUN 13; Creat 0.81; Hemoglobin 13.4; Platelets 286; Potassium 4.0; Sodium 139   Lipid Panel    Component Value Date/Time   CHOL 142 05/21/2016 1100   TRIG 59 05/21/2016 1100   HDL 70 05/21/2016 1100   CHOLHDL 2.0 05/21/2016 1100   VLDL 12 05/21/2016 1100   LDLCALC 60  05/21/2016 1100    Additional studies/ records that were reviewed today include:  none    ASSESSMENT:    1. Persistent atrial fibrillation ([place])   2. Coronary artery disease involving native coronary artery of native heart without angina pectoris   3. Essential hypertension   4. Tachy-brady syndrome ([place])   5. Hypercholesteremia      PLAN:  In order of problems listed above:  1. Persistent atrial fibrillation s/p afib ablation.  She has had no reoccurence of her afib and no arrhythmias by pacer check.  She will continue on eliquis and BB.  I will check a NOAC panel today.  2. ASCAD s/p remote PCI OM.  She has not had any anginal symptoms.  She is not on ASA due to Eliquis.  She will continue statin/BB.  3. HTN - BP adequately controlled today on exam.  She will continue on BB and ACE I. I will check a BMET. 4. Tachybrady syndrome s/p PPM - she is followed in our pacer clinic. 5.   Hyperlipidemia - LDL goal < 70.  Continue statin.  I will check an FLP and ALT.  6.   Dizziness with presyncope ? Etiology - this only occurred once but was fairly profound.  Her orthostatics are normal on exam and pacer check shows on arrhythmias.  She has not had a noninvasive ischemic workup since her cath and has noticed over the  past month that she is having exertional fatigue and DOE.  I will set her up for Lexiscan myoview to rule out ischemia.  I will also repeat a 2D echo.     Medication Adjustments/Labs and Tests Ordered: Current medicines are reviewed at length with the patient today.  Concerns regarding medicines are outlined above.  Medication changes, Labs and Tests ordered today are listed in the Patient Instructions below.  There are no Patient Instructions on file for this visit.   Signed, Fransico Him, MD  12/10/2016 11:10 AM    [address], [place], [place]  [address] Phone: [phone]; Fax: [phone]

## 2016-12-10 NOTE — Patient Instructions (Signed)
Medication Instructions:  Your physician recommends that you continue on your current medications as directed. Please refer to the Current Medication list given to you today.   Labwork: None  Testing/Procedures: Your physician has requested that you have a lexiscan myoview. For further information please visit [email or web address]. Please follow instruction sheet, as given.   Your physician has requested that you have an echocardiogram. Echocardiography is a painless test that uses sound waves to create images of your heart. It provides your doctor with information about the size and shape of your heart and how well your heart's chambers and valves are working. This procedure takes approximately one hour. There are no restrictions for this procedure.  Follow-Up: Your physician wants you to follow-up in: 6 months with Dr. Radford Pax. You will receive a reminder letter in the mail two months in advance. If you don't receive a letter, please call our office to schedule the follow-up appointment.   Any Other Special Instructions Will Be Listed Below (If Applicable).     If you need a refill on your cardiac medications before your next appointment, please call your pharmacy.

## 2016-12-10 NOTE — Progress Notes (Signed)
Pacemaker check in clinic at request of Dr. Radford Pax d/t pt c/o of dizziness.  Normal device function. Thresholds, sensing, impedances consistent with previous measurements. Device programmed to maximize longevity. 1 AT/AF <30sec. No high ventricular rates noted. Device programmed at appropriate safety margins. Histogram distribution appropriate for patient activity level. Device programmed to optimize intrinsic conduction. Estimated longevity 7.0 years. Due to see JA. Msg sent to [facility] to schedule. Patient education completed.

## 2016-12-23 ENCOUNTER — Telehealth (HOSPITAL_COMMUNITY): Payer: Self-pay | Admitting: Cardiology

## 2016-12-23 ENCOUNTER — Telehealth (HOSPITAL_COMMUNITY): Payer: Self-pay | Admitting: *Deleted

## 2016-12-23 NOTE — Telephone Encounter (Signed)
Patients wants to reschedule appointments for 12/28/16. Will forward to [place].  Kirstie Peri

## 2016-12-24 NOTE — Telephone Encounter (Signed)
  12/23/2016 01:53 PM Phone (Outgoing) Cheryl [name], Cheryl [name] (Self) [phone] (H)   Left Message - Called pt and lmsg for her to CB to r/s echo and myoview on the same day.     By Verdene Rio

## 2016-12-28 ENCOUNTER — Other Ambulatory Visit (HOSPITAL_COMMUNITY): Payer: Medicare Other

## 2016-12-28 ENCOUNTER — Encounter (HOSPITAL_COMMUNITY): Payer: Medicare Other

## 2017-01-11 ENCOUNTER — Other Ambulatory Visit: Payer: Self-pay | Admitting: Cardiology

## 2017-01-13 ENCOUNTER — Encounter: Payer: Self-pay | Admitting: Internal Medicine

## 2017-01-13 ENCOUNTER — Ambulatory Visit (INDEPENDENT_AMBULATORY_CARE_PROVIDER_SITE_OTHER): Payer: Medicare Other | Admitting: Internal Medicine

## 2017-01-13 VITALS — BP 124/76 | HR 86 | Ht 63.0 in | Wt 179.2 lb

## 2017-01-13 DIAGNOSIS — I4819 Other persistent atrial fibrillation: Secondary | ICD-10-CM

## 2017-01-13 DIAGNOSIS — I495 Sick sinus syndrome: Secondary | ICD-10-CM

## 2017-01-13 DIAGNOSIS — I10 Essential (primary) hypertension: Secondary | ICD-10-CM

## 2017-01-13 DIAGNOSIS — I2510 Atherosclerotic heart disease of native coronary artery without angina pectoris: Secondary | ICD-10-CM

## 2017-01-13 DIAGNOSIS — I481 Persistent atrial fibrillation: Secondary | ICD-10-CM | POA: Diagnosis not present

## 2017-01-13 LAB — CUP PACEART INCLINIC DEVICE CHECK
Battery Remaining Longevity: 127 mo
Battery Voltage: 2.95 V
Date Time Interrogation Session: 20180614142047
Implantable Lead Implant Date: 20130328
Implantable Lead Location: 753860
Implantable Pulse Generator Implant Date: 20130328
Lead Channel Impedance Value: 412.5 Ohm
Lead Channel Pacing Threshold Amplitude: 1.25 V
Lead Channel Pacing Threshold Pulse Width: 0.7 ms
Lead Channel Sensing Intrinsic Amplitude: 5.2 mV
Lead Channel Setting Pacing Amplitude: 1.625
Lead Channel Setting Pacing Amplitude: 2.5 V
Lead Channel Setting Pacing Pulse Width: 0.7 ms
MDC IDC LEAD IMPLANT DT: 20130328
MDC IDC LEAD LOCATION: 753859
MDC IDC MSMT LEADCHNL RA PACING THRESHOLD AMPLITUDE: 0.5 V
MDC IDC MSMT LEADCHNL RA PACING THRESHOLD AMPLITUDE: 0.5 V
MDC IDC MSMT LEADCHNL RA PACING THRESHOLD PULSEWIDTH: 0.5 ms
MDC IDC MSMT LEADCHNL RA PACING THRESHOLD PULSEWIDTH: 0.5 ms
MDC IDC MSMT LEADCHNL RA SENSING INTR AMPL: 5 mV
MDC IDC MSMT LEADCHNL RV IMPEDANCE VALUE: 362.5 Ohm
MDC IDC MSMT LEADCHNL RV PACING THRESHOLD AMPLITUDE: 1.25 V
MDC IDC MSMT LEADCHNL RV PACING THRESHOLD PULSEWIDTH: 0.7 ms
MDC IDC SET LEADCHNL RV SENSING SENSITIVITY: 2 mV
MDC IDC STAT BRADY RA PERCENT PACED: 11 %
MDC IDC STAT BRADY RV PERCENT PACED: 0.07 %
Pulse Gen Model: 2210
Pulse Gen Serial Number: 7334985

## 2017-01-13 NOTE — Progress Notes (Signed)
PCP: Leighton Ruff, MD Primary Cardiologist:  Dr Delight Stare is a 71 y.o. female who presents today for routine electrophysiology followup.  Since last being seen in our clinic, the patient reports doing very well.  Today, she denies symptoms of palpitations, chest pain, shortness of breath,  lower extremity edema, dizziness, presyncope, or syncope.  The patient is otherwise without complaint today.   Past Medical History:  Diagnosis Date  . Anemia   . Arthritis   . Blood transfusion ~ 1962   " couple; no reaction " (05/30/2012)  . Coronary artery disease 2013   PCI PCI of OM  . Depression   . GERD (gastroesophageal reflux disease)   . H/O hiatal hernia   . HTN (hypertension)   . Hypercholesteremia   . Hypothyroidism   . Myocardial infarction [facility])    "very very light" (05/30/2012)  . Pacemaker 10/2011  . PAF (paroxysmal atrial fibrillation) ([place]) 05/30/2012   s/p afib/flutter ablation 2013/2017  . Tachy-brady syndrome ([place]) 10/2011   a. post-termination pauses in setting of PAF;  b. 10/28/11 - St. Jude Dual Chamber PPM SN [id number]   . Ureteral obstruction, right    Past Surgical History:  Procedure Laterality Date  . ATRIAL FIBRILLATION ABLATION  05/30/2012   PVI and CTi ablation by Dr Rayann Heman  . ATRIAL FIBRILLATION ABLATION N/A 05/30/2012   Procedure: ATRIAL FIBRILLATION ABLATION;  Surgeon: Thompson Grayer, MD;  Location: [facility] CATH LAB;  Service: Cardiovascular;  Laterality: N/A;  . BREAST SURGERY  1990's   "right radial scar" (05/30/2012)  . CORONARY ANGIOPLASTY  ~ 04/2012  . CYSTOSCOPY WITH RETROGRADE PYELOGRAM, URETEROSCOPY AND STENT PLACEMENT  08/21/2012   Procedure: CYSTOSCOPY WITH RETROGRADE PYELOGRAM, URETEROSCOPY AND STENT PLACEMENT;  Surgeon: Franchot Gallo, MD;  [facility] ORS;  Service: Urology;  Laterality: Right;  URETEROSCOPY WITH BIOPSY   . ELECTROPHYSIOLOGIC STUDY N/A 09/23/2015   Procedure: Atrial Fibrillation Ablation;  Surgeon: Thompson Grayer, MD;   Location: [place] CV LAB;  Service: Cardiovascular;  Laterality: N/A;  . INSERT / REPLACE / REMOVE PACEMAKER  10/28/2011   initial placement  . LEFT HEART CATHETERIZATION WITH CORONARY ANGIOGRAM N/A 04/04/2012   Procedure: LEFT HEART CATHETERIZATION WITH CORONARY ANGIOGRAM;  Surgeon: Sinclair Grooms, MD;  Location: [facility] CATH LAB;  Service: Cardiovascular;  Laterality: N/A;  . PERCUTANEOUS CORONARY INTERVENTION-BALLOON ONLY Right 04/04/2012   Procedure: PERCUTANEOUS CORONARY INTERVENTION-BALLOON ONLY;  Surgeon: Sinclair Grooms, MD;  Location: [facility] CATH LAB;  Service: Cardiovascular;  Laterality: Right;  . PERMANENT PACEMAKER INSERTION N/A 10/28/2011   Procedure: PERMANENT PACEMAKER INSERTION;  Surgeon: [name] Lance, MD;  Location: [facility] CATH LAB;  Service: Cardiovascular;  Laterality: N/A;  . TEE WITHOUT CARDIOVERSION  05/29/2012   Procedure: TRANSESOPHAGEAL ECHOCARDIOGRAM (TEE);  Surgeon: Sueanne Margarita, MD;  Location: [facility] ENDOSCOPY;  Service: Cardiovascular;  Laterality: N/A;  h/p in file drawer/dl    ROS- all systems are reviewed and negative except as per HPI above  Current Outpatient Prescriptions  Medication Sig Dispense Refill  . ELIQUIS 5 MG TABS tablet TAKE ONE TABLET BY MOUTH TWICE DAILY 60 tablet 10  . levothyroxine (SYNTHROID, LEVOTHROID) 100 MCG tablet Take 100 mcg by mouth daily before breakfast.     . lisinopril (PRINIVIL,ZESTRIL) 10 MG tablet Take 1 tablet (10 mg total) by mouth daily. 90 tablet 3  . metoprolol succinate (TOPROL-XL) 50 MG 24 hr tablet TAKE 1 TABLET BY MOUTH DAILY WITH OR IMMEDIATELY FOLLOWING A MEAL 90 tablet 3  .  rosuvastatin (CRESTOR) 40 MG tablet Take 1 tablet (40 mg total) by mouth daily. 30 tablet 11   No current facility-administered medications for this visit.     Physical Exam: Vitals:   01/13/17 1352  BP: 124/76  Pulse: 86  SpO2: 96%  Weight: 179 lb 3.2 oz (81.3 kg)  Height: 5\' 3"  (1.6 m)    GEN- The patient is well appearing, alert and  oriented x 3 today.   Head- normocephalic, atraumatic Eyes-  Sclera clear, conjunctiva pink Ears- hearing intact Oropharynx- clear Lungs- Clear to ausculation bilaterally, normal work of breathing Chest- pacemaker pocket is well healed Heart- Regular rate and rhythm, no murmurs, rubs or gallops, PMI not laterally displaced GI- soft, NT, ND, + BS Extremities- no clubbing, cyanosis, or edema  Pacemaker interrogation- reviewed in detail today,  See PACEART report  Assessment and Plan:  1. Symptomatic sinus bradycardia Normal pacemaker function See Pace Art report No changes today  2. Atrial fibrillation Doing well post ablation without recurrence off AAD therapy, without recurrence On eliquis, chads2vasc score is 3  3. HTN Stable No change required today  Follow-up with Dr Radford Pax as scheduled I will see in 12 months Sherrian Divers MD, [facility] 01/13/2017 2:02 PM

## 2017-01-13 NOTE — Patient Instructions (Signed)
Medication Instructions:  Your physician recommends that you continue on your current medications as directed. Please refer to the Current Medication list given to you today.   Labwork: None ordered   Testing/Procedures: None ordered   Follow-Up: Remote monitoring is used to monitor your Pacemaker from home. This monitoring reduces the number of office visits required to check your device to one time per year. It allows [place] to keep an eye on the functioning of your device to ensure it is working properly. You are scheduled for a device check from home on 04/14/17. You may send your transmission at any time that day. If you have a wireless device, the transmission will be sent automatically. After your physician reviews your transmission, you will receive a postcard with your next transmission date.   Your physician wants you to follow-up in: 12 months with Dr Vallery Ridge will receive a reminder letter in the mail two months in advance. If you don't receive a letter, please call our office to schedule the follow-up appointment.    Any Other Special Instructions Will Be Listed Below (If Applicable).     If you need a refill on your cardiac medications before your next appointment, please call your pharmacy.

## 2017-01-18 ENCOUNTER — Ambulatory Visit: Payer: Medicare Other | Admitting: Cardiology

## 2017-01-19 ENCOUNTER — Telehealth (HOSPITAL_COMMUNITY): Payer: Self-pay | Admitting: *Deleted

## 2017-01-19 NOTE — Telephone Encounter (Signed)
Left message on voicemail per DPR in reference to upcoming appointment scheduled on 01/24/17 with detailed instructions given per Myocardial Perfusion Study Information Sheet for the test. LM to arrive 15 minutes early, and that it is imperative to arrive on time for appointment to keep from having the test rescheduled. If you need to cancel or reschedule your appointment, please call the office within 24 hours of your appointment. Failure to do so may result in a cancellation of your appointment, and a $50 no show fee. Phone number given for call back for any questions. Kirstie Peri

## 2017-01-24 ENCOUNTER — Other Ambulatory Visit (HOSPITAL_COMMUNITY): Payer: Medicare Other

## 2017-01-24 ENCOUNTER — Encounter (HOSPITAL_COMMUNITY): Payer: Medicare Other

## 2017-01-24 ENCOUNTER — Other Ambulatory Visit: Payer: Self-pay | Admitting: Cardiology

## 2017-01-24 NOTE — Telephone Encounter (Signed)
Request received for Eliquis 5mg ; pt is 71 yrs old, wt-81.3kg, Crea-0.81 on 07/14/16, last seen by Dr. Rayann Heman on 01/13/17; will send in refill request to requested Pharmacy.

## 2017-02-07 ENCOUNTER — Ambulatory Visit (HOSPITAL_COMMUNITY): Payer: Medicare Other

## 2017-02-08 ENCOUNTER — Other Ambulatory Visit: Payer: Self-pay

## 2017-02-08 ENCOUNTER — Ambulatory Visit (HOSPITAL_COMMUNITY): Payer: Medicare Other | Attending: Internal Medicine

## 2017-02-08 DIAGNOSIS — I10 Essential (primary) hypertension: Secondary | ICD-10-CM | POA: Insufficient documentation

## 2017-02-08 DIAGNOSIS — I2510 Atherosclerotic heart disease of native coronary artery without angina pectoris: Secondary | ICD-10-CM | POA: Insufficient documentation

## 2017-02-08 DIAGNOSIS — I252 Old myocardial infarction: Secondary | ICD-10-CM | POA: Diagnosis not present

## 2017-02-08 DIAGNOSIS — Z8249 Family history of ischemic heart disease and other diseases of the circulatory system: Secondary | ICD-10-CM | POA: Insufficient documentation

## 2017-02-08 DIAGNOSIS — I4819 Other persistent atrial fibrillation: Secondary | ICD-10-CM

## 2017-02-08 DIAGNOSIS — I071 Rheumatic tricuspid insufficiency: Secondary | ICD-10-CM | POA: Diagnosis not present

## 2017-02-08 DIAGNOSIS — E785 Hyperlipidemia, unspecified: Secondary | ICD-10-CM | POA: Insufficient documentation

## 2017-02-08 DIAGNOSIS — Z720 Tobacco use: Secondary | ICD-10-CM | POA: Insufficient documentation

## 2017-02-08 DIAGNOSIS — I481 Persistent atrial fibrillation: Secondary | ICD-10-CM

## 2017-02-08 DIAGNOSIS — R42 Dizziness and giddiness: Secondary | ICD-10-CM | POA: Diagnosis not present

## 2017-02-28 ENCOUNTER — Telehealth (HOSPITAL_COMMUNITY): Payer: Self-pay | Admitting: Cardiology

## 2017-02-28 NOTE — Telephone Encounter (Signed)
I called patient to see if she would like to reschedule her myoview that had been cancelled and she voiced that she was told by Dr. Theodosia Blender nurse that she no longer needed her the myoview.

## 2017-03-03 DIAGNOSIS — M25512 Pain in left shoulder: Secondary | ICD-10-CM | POA: Diagnosis not present

## 2017-03-11 ENCOUNTER — Encounter: Payer: Self-pay | Admitting: Nurse Practitioner

## 2017-03-22 DIAGNOSIS — M25611 Stiffness of right shoulder, not elsewhere classified: Secondary | ICD-10-CM | POA: Diagnosis not present

## 2017-03-22 DIAGNOSIS — M25511 Pain in right shoulder: Secondary | ICD-10-CM | POA: Diagnosis not present

## 2017-03-23 DIAGNOSIS — M25511 Pain in right shoulder: Secondary | ICD-10-CM | POA: Diagnosis not present

## 2017-03-23 DIAGNOSIS — M25611 Stiffness of right shoulder, not elsewhere classified: Secondary | ICD-10-CM | POA: Diagnosis not present

## 2017-03-29 DIAGNOSIS — M25511 Pain in right shoulder: Secondary | ICD-10-CM | POA: Diagnosis not present

## 2017-03-29 DIAGNOSIS — M25611 Stiffness of right shoulder, not elsewhere classified: Secondary | ICD-10-CM | POA: Diagnosis not present

## 2017-03-31 DIAGNOSIS — M25511 Pain in right shoulder: Secondary | ICD-10-CM | POA: Diagnosis not present

## 2017-03-31 DIAGNOSIS — M25611 Stiffness of right shoulder, not elsewhere classified: Secondary | ICD-10-CM | POA: Diagnosis not present

## 2017-04-06 DIAGNOSIS — M25511 Pain in right shoulder: Secondary | ICD-10-CM | POA: Diagnosis not present

## 2017-04-06 DIAGNOSIS — M25611 Stiffness of right shoulder, not elsewhere classified: Secondary | ICD-10-CM | POA: Diagnosis not present

## 2017-04-13 DIAGNOSIS — M25611 Stiffness of right shoulder, not elsewhere classified: Secondary | ICD-10-CM | POA: Diagnosis not present

## 2017-04-13 DIAGNOSIS — M25511 Pain in right shoulder: Secondary | ICD-10-CM | POA: Diagnosis not present

## 2017-04-14 ENCOUNTER — Ambulatory Visit (INDEPENDENT_AMBULATORY_CARE_PROVIDER_SITE_OTHER): Payer: Medicare Other | Admitting: *Deleted

## 2017-04-14 DIAGNOSIS — I495 Sick sinus syndrome: Secondary | ICD-10-CM | POA: Diagnosis not present

## 2017-04-14 NOTE — Progress Notes (Signed)
Remote pacemaker transmission.   

## 2017-04-15 ENCOUNTER — Encounter: Payer: Self-pay | Admitting: Cardiology

## 2017-04-19 DIAGNOSIS — M25511 Pain in right shoulder: Secondary | ICD-10-CM | POA: Diagnosis not present

## 2017-04-19 DIAGNOSIS — M25611 Stiffness of right shoulder, not elsewhere classified: Secondary | ICD-10-CM | POA: Diagnosis not present

## 2017-04-23 ENCOUNTER — Other Ambulatory Visit: Payer: Self-pay | Admitting: Cardiology

## 2017-04-25 LAB — CUP PACEART REMOTE DEVICE CHECK
Battery Remaining Longevity: 107 mo
Battery Remaining Percentage: 91 %
Brady Statistic AP VS Percent: 18 %
Brady Statistic AS VS Percent: 82 %
Implantable Lead Implant Date: 20130328
Implantable Lead Implant Date: 20130328
Implantable Lead Location: 753860
Lead Channel Impedance Value: 380 Ohm
Lead Channel Impedance Value: 410 Ohm
Lead Channel Pacing Threshold Amplitude: 1.25 V
Lead Channel Pacing Threshold Pulse Width: 0.7 ms
Lead Channel Sensing Intrinsic Amplitude: 4.6 mV
Lead Channel Setting Pacing Amplitude: 2.5 V
Lead Channel Setting Pacing Pulse Width: 0.7 ms
MDC IDC LEAD LOCATION: 753859
MDC IDC MSMT BATTERY VOLTAGE: 2.95 V
MDC IDC MSMT LEADCHNL RA PACING THRESHOLD AMPLITUDE: 0.625 V
MDC IDC MSMT LEADCHNL RA PACING THRESHOLD PULSEWIDTH: 0.5 ms
MDC IDC MSMT LEADCHNL RV SENSING INTR AMPL: 4.3 mV
MDC IDC PG IMPLANT DT: 20130328
MDC IDC PG SERIAL: 7334985
MDC IDC SESS DTM: 20180913073555
MDC IDC SET LEADCHNL RA PACING AMPLITUDE: 1.625
MDC IDC SET LEADCHNL RV SENSING SENSITIVITY: 2 mV
MDC IDC STAT BRADY AP VP PERCENT: 1 %
MDC IDC STAT BRADY AS VP PERCENT: 1 %
MDC IDC STAT BRADY RA PERCENT PACED: 18 %
MDC IDC STAT BRADY RV PERCENT PACED: 1 %

## 2017-04-26 DIAGNOSIS — M25511 Pain in right shoulder: Secondary | ICD-10-CM | POA: Diagnosis not present

## 2017-04-26 DIAGNOSIS — M25611 Stiffness of right shoulder, not elsewhere classified: Secondary | ICD-10-CM | POA: Diagnosis not present

## 2017-05-10 DIAGNOSIS — M25611 Stiffness of right shoulder, not elsewhere classified: Secondary | ICD-10-CM | POA: Diagnosis not present

## 2017-05-10 DIAGNOSIS — M25511 Pain in right shoulder: Secondary | ICD-10-CM | POA: Diagnosis not present

## 2017-05-18 DIAGNOSIS — M25611 Stiffness of right shoulder, not elsewhere classified: Secondary | ICD-10-CM | POA: Diagnosis not present

## 2017-05-18 DIAGNOSIS — M25511 Pain in right shoulder: Secondary | ICD-10-CM | POA: Diagnosis not present

## 2017-07-14 ENCOUNTER — Ambulatory Visit (INDEPENDENT_AMBULATORY_CARE_PROVIDER_SITE_OTHER): Payer: Medicare Other | Admitting: *Deleted

## 2017-07-14 DIAGNOSIS — I495 Sick sinus syndrome: Secondary | ICD-10-CM

## 2017-07-14 NOTE — Progress Notes (Signed)
Remote pacemaker transmission.   

## 2017-07-19 ENCOUNTER — Encounter: Payer: Self-pay | Admitting: Cardiology

## 2017-07-22 ENCOUNTER — Other Ambulatory Visit: Payer: Self-pay | Admitting: Cardiology

## 2017-08-12 LAB — CUP PACEART REMOTE DEVICE CHECK
Brady Statistic AP VP Percent: 1 %
Brady Statistic AP VS Percent: 17 %
Brady Statistic AS VP Percent: 1 %
Brady Statistic RA Percent Paced: 17 %
Brady Statistic RV Percent Paced: 1 %
Date Time Interrogation Session: 20181213080447
Implantable Lead Implant Date: 20130328
Lead Channel Impedance Value: 350 Ohm
Lead Channel Impedance Value: 400 Ohm
Lead Channel Pacing Threshold Amplitude: 1.25 V
Lead Channel Sensing Intrinsic Amplitude: 4.7 mV
Lead Channel Setting Pacing Amplitude: 1.625
Lead Channel Setting Pacing Pulse Width: 0.7 ms
Lead Channel Setting Sensing Sensitivity: 2 mV
MDC IDC LEAD IMPLANT DT: 20130328
MDC IDC LEAD LOCATION: 753859
MDC IDC LEAD LOCATION: 753860
MDC IDC MSMT BATTERY REMAINING LONGEVITY: 95 mo
MDC IDC MSMT BATTERY REMAINING PERCENTAGE: 81 %
MDC IDC MSMT BATTERY VOLTAGE: 2.93 V
MDC IDC MSMT LEADCHNL RA PACING THRESHOLD AMPLITUDE: 0.625 V
MDC IDC MSMT LEADCHNL RA PACING THRESHOLD PULSEWIDTH: 0.5 ms
MDC IDC MSMT LEADCHNL RA SENSING INTR AMPL: 5 mV
MDC IDC MSMT LEADCHNL RV PACING THRESHOLD PULSEWIDTH: 0.7 ms
MDC IDC PG IMPLANT DT: 20130328
MDC IDC SET LEADCHNL RV PACING AMPLITUDE: 2.5 V
MDC IDC STAT BRADY AS VS PERCENT: 83 %
Pulse Gen Model: 2210
Pulse Gen Serial Number: 7334985

## 2017-08-28 NOTE — Progress Notes (Signed)
Cardiology Office Note:    Date:  08/29/2017   ID:  Cheryl [name], DOB 09-27-1945, MRN [id number]  PCP:  Leighton Ruff, MD  Cardiologist:  No primary care provider on file.    Referring MD: Leighton Ruff, MD   Chief Complaint  Patient presents with  . Atrial Fibrillation  . Hypertension  . Coronary Artery Disease  . Hyperlipidemia    History of Present Illness:    Cheryl [name] is a 72 y.o. female with a hx of PAF/flutter s/p ablation x 2, tachy/brady syndrome s/p PPM, HTN, ASCAD s/p PCI of OM and dyslipidemia. She is here today for followup and is doing well.  She denies any chest pain or pressure, SOB, DOE, PND, orthopnea, dizziness, palpitations or syncope. Occasionall she will have some mild LE edema of her legs at the end of the day.  She is compliant with her meds and is tolerating meds with no SE except for a chronic dry cough.  SHe denies any problems with bleeding.      Past Medical History:  Diagnosis Date  . Anemia   . Arthritis   . Blood transfusion ~ 1962   " couple; no reaction " (05/30/2012)  . Coronary artery disease 2013   PCI PCI of OM  . Depression   . GERD (gastroesophageal reflux disease)   . H/O hiatal hernia   . HTN (hypertension)   . Hypercholesteremia   . Hypothyroidism   . Myocardial infarction [facility])    "very very light" (05/30/2012)  . PAF (paroxysmal atrial fibrillation) ([place]) 05/30/2012   s/p afib/flutter ablation 2013/2017  . Tachy-brady syndrome ([place]) 10/2011   a. post-termination pauses in setting of PAF;  b. 10/28/11 - St. Jude Dual Chamber PPM SN [id number]   . Ureteral obstruction, right     Past Surgical History:  Procedure Laterality Date  . ATRIAL FIBRILLATION ABLATION N/A 05/30/2012   PVI and CTi ablation by Dr Rayann Heman  . BREAST SURGERY  1990's   "right radial scar" (05/30/2012)  . CORONARY ANGIOPLASTY  ~ 04/2012  . CYSTOSCOPY WITH RETROGRADE PYELOGRAM, URETEROSCOPY AND STENT PLACEMENT  08/21/2012   Procedure: CYSTOSCOPY WITH  RETROGRADE PYELOGRAM, URETEROSCOPY AND STENT PLACEMENT;  Surgeon: Franchot Gallo, MD;  [facility];  Service: Urology;  Laterality: Right;  URETEROSCOPY WITH BIOPSY   . ELECTROPHYSIOLOGIC STUDY N/A 09/23/2015   Procedure: Atrial Fibrillation Ablation;  Surgeon: Thompson Grayer, MD;  Location: [place] CV LAB;  Service: Cardiovascular;  Laterality: N/A;  . LEFT HEART CATHETERIZATION WITH CORONARY ANGIOGRAM N/A 04/04/2012   Procedure: LEFT HEART CATHETERIZATION WITH CORONARY ANGIOGRAM;  Surgeon: Sinclair Grooms, MD;  Location: [facility] CATH LAB;  Service: Cardiovascular;  Laterality: N/A;  . PERCUTANEOUS CORONARY INTERVENTION-BALLOON ONLY Right 04/04/2012   Procedure: PERCUTANEOUS CORONARY INTERVENTION-BALLOON ONLY;  Surgeon: Sinclair Grooms, MD;  Location: [facility] CATH LAB;  Service: Cardiovascular;  Laterality: Right;  . PERMANENT PACEMAKER INSERTION N/A 10/28/2011   Procedure: PERMANENT PACEMAKER INSERTION;  Surgeon: [name] Lance, MD;  Location: [facility] CATH LAB;  Service: Cardiovascular;  Laterality: N/A;  . TEE WITHOUT CARDIOVERSION  05/29/2012   Procedure: TRANSESOPHAGEAL ECHOCARDIOGRAM (TEE);  Surgeon: Sueanne Margarita, MD;  Location: [facility] ENDOSCOPY;  Service: Cardiovascular;  Laterality: N/A;  h/p in file drawer/dl    Current Medications: Current Meds  Medication Sig  . ELIQUIS 5 MG TABS tablet TAKE 1 TABLET BY MOUTH TWICE DAILY  . levothyroxine (SYNTHROID, LEVOTHROID) 100 MCG tablet Take 100 mcg by mouth daily before  breakfast.   . lisinopril (PRINIVIL,ZESTRIL) 10 MG tablet Take 1 tablet (10 mg total) by mouth daily.  . metoprolol succinate (TOPROL-XL) 50 MG 24 hr tablet TAKE 1 TABLET BY MOUTH DAILY WITH OR IMMEDIATELY FOLLOWING A MEAL  . rosuvastatin (CRESTOR) 40 MG tablet TAKE 1 TABLET BY MOUTH DAILY     Allergies:   Latex; Protonix [pantoprazole sodium]; Codeine; and Tape   Social History   Socioeconomic History  . Marital status: Married    Spouse name: None  . Number of children: 4  .  Years of education: None  . Highest education level: None  Social Needs  . Financial resource strain: None  . Food insecurity - worry: None  . Food insecurity - inability: None  . Transportation needs - medical: None  . Transportation needs - non-medical: None  Occupational History  . None  Tobacco Use  . Smoking status: Former Smoker    Packs/day: 0.12    Years: 15.00    Pack years: 1.80    Types: Cigarettes    Last attempt to quit: 06/17/1977    Years since quitting: 40.2  . Smokeless tobacco: Never Used  . Tobacco comment: QUIT smoking cigarettes 1978  Substance and Sexual Activity  . Alcohol use: No  . Drug use: No  . Sexual activity: No    Birth control/protection: Post-menopausal  Other Topics Concern  . None  Social History Narrative   Lives with husband.  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     Family History: The patient's family history includes Breast cancer in her mother; Heart attack (age of onset: 45) in her father; Heart attack (age of onset: 58) in her mother; Pancreatic cancer in her mother; Uterine cancer in her mother.  ROS:   Please see the history of present illness.    ROS  All other systems reviewed and negative.   EKGs/Labs/Other Studies Reviewed:    The following studies were reviewed today: none  EKG:  EKG is not ordered today.    Recent Labs: No results found for requested labs within last 8760 hours.   Recent Lipid Panel    Component Value Date/Time   CHOL 142 05/21/2016 1100   TRIG 59 05/21/2016 1100   HDL 70 05/21/2016 1100   CHOLHDL 2.0 05/21/2016 1100   VLDL 12 05/21/2016 1100   LDLCALC 60 05/21/2016 1100    Physical Exam:    VS:  BP 120/76   Pulse 74   Ht 5\' 3"  (1.6 m)   Wt 181 lb 3.2 oz (82.2 kg)   BMI 32.10 kg/m     Wt Readings from Last 3 Encounters:  08/29/17 181 lb 3.2 oz (82.2 kg)  01/13/17 179 lb 3.2 oz (81.3 kg)  12/10/16 179 lb (81.2 kg)     GEN:  Well nourished, well developed in no acute  distress HEENT: Normal NECK: No JVD; No carotid bruits LYMPHATICS: No lymphadenopathy CARDIAC: RRR, no murmurs, rubs, gallops RESPIRATORY:  Clear to auscultation without rales, wheezing or rhonchi  ABDOMEN: Soft, non-tender, non-distended MUSCULOSKELETAL:  No edema; No deformity  SKIN: Warm and dry NEUROLOGIC:  Alert and oriented x 3 PSYCHIATRIC:  Normal affect   ASSESSMENT:    1. Coronary artery disease involving native coronary artery of native heart without angina pectoris   2. Tachy-brady syndrome (HCC)   3. Persistent atrial fibrillation ([place])   4. Essential hypertension   5. Hypercholesteremia    PLAN:    In order of problems listed above:  1.  ASCAD s/p PCI of the OM - she denies any anginal symptoms.  She will continue on statin and BB.  She is not on ASA due to NOAC.  2.  Tachybrady syndrome s/p PPM followed in our device clinic.  3.  Persistent atrial fibrillation - she is maintaining NSR.  She will continue on Toprol XL 50mg  daily and Eliquis 5mg  BID. I will check a BMET and CBC.  4.  HTN - BP is well controlled on exam.  She will continue on Toprol XL 50mg  daily and Lisinopril 10mg  daily.  We discussed changing from an ACE I due to cough but she wants to stay on it.  5.  Hyperlipidemia with LDL goal < 70.  She will continue on Crestor 40mg  daily.  I will get an FLP and ALT.   Medication Adjustments/Labs and Tests Ordered: Current medicines are reviewed at length with the patient today.  Concerns regarding medicines are outlined above.  No orders of the defined types were placed in this encounter.  No orders of the defined types were placed in this encounter.   Signed, Fransico Him, MD  08/29/2017 8:27 AM    [place]

## 2017-08-29 ENCOUNTER — Encounter: Payer: Self-pay | Admitting: Cardiology

## 2017-08-29 ENCOUNTER — Ambulatory Visit (INDEPENDENT_AMBULATORY_CARE_PROVIDER_SITE_OTHER): Payer: Medicare Other | Admitting: Cardiology

## 2017-08-29 VITALS — BP 120/76 | HR 74 | Ht 63.0 in | Wt 181.2 lb

## 2017-08-29 DIAGNOSIS — I10 Essential (primary) hypertension: Secondary | ICD-10-CM | POA: Diagnosis not present

## 2017-08-29 DIAGNOSIS — I4819 Other persistent atrial fibrillation: Secondary | ICD-10-CM

## 2017-08-29 DIAGNOSIS — I495 Sick sinus syndrome: Secondary | ICD-10-CM

## 2017-08-29 DIAGNOSIS — E7800 Pure hypercholesterolemia, unspecified: Secondary | ICD-10-CM | POA: Diagnosis not present

## 2017-08-29 DIAGNOSIS — I2510 Atherosclerotic heart disease of native coronary artery without angina pectoris: Secondary | ICD-10-CM | POA: Diagnosis not present

## 2017-08-29 DIAGNOSIS — I481 Persistent atrial fibrillation: Secondary | ICD-10-CM

## 2017-08-29 LAB — BASIC METABOLIC PANEL
BUN/Creatinine Ratio: 16 (ref 12–28)
BUN: 12 mg/dL (ref 8–27)
CALCIUM: 9.7 mg/dL (ref 8.7–10.3)
CO2: 25 mmol/L (ref 20–29)
CREATININE: 0.76 mg/dL (ref 0.57–1.00)
Chloride: 98 mmol/L (ref 96–106)
GFR calc Af Amer: 92 mL/min/{1.73_m2} (ref >59)
GFR calc non Af Amer: 80 mL/min/{1.73_m2} (ref >59)
GLUCOSE: 94 mg/dL (ref 65–99)
Potassium: 4.1 mmol/L (ref 3.5–5.2)
Sodium: 142 mmol/L (ref 134–144)

## 2017-08-29 LAB — CBC
HEMATOCRIT: 39.2 % (ref 34.0–46.6)
Hemoglobin: 13.4 g/dL (ref 11.1–15.9)
MCH: 28.5 pg (ref 26.6–33.0)
MCHC: 34.2 g/dL (ref 31.5–35.7)
MCV: 83 fL (ref 79–97)
Platelets: 284 10*3/uL (ref 150–379)
RBC: 4.7 x10E6/uL (ref 3.77–5.28)
RDW: 13.4 % (ref 12.3–15.4)
WBC: 6.6 10*3/uL (ref 3.4–10.8)

## 2017-08-29 LAB — LIPID PANEL
CHOL/HDL RATIO: 2.3 ratio (ref 0.0–4.4)
Cholesterol, Total: 148 mg/dL (ref 100–199)
HDL: 64 mg/dL (ref >39)
LDL Calculated: 65 mg/dL (ref 0–99)
Triglycerides: 97 mg/dL (ref 0–149)
VLDL CHOLESTEROL CAL: 19 mg/dL (ref 5–40)

## 2017-08-29 LAB — HEPATIC FUNCTION PANEL
ALT: 12 IU/L (ref 0–32)
AST: 15 IU/L (ref 0–40)
Albumin: 4.4 g/dL (ref 3.5–4.8)
Alkaline Phosphatase: 23 IU/L — ABNORMAL LOW (ref 39–117)
BILIRUBIN TOTAL: 0.5 mg/dL (ref 0.0–1.2)
BILIRUBIN, DIRECT: 0.13 mg/dL (ref 0.00–0.40)
Total Protein: 6.9 g/dL (ref 6.0–8.5)

## 2017-08-29 NOTE — Patient Instructions (Signed)
Medication Instructions:  Your physician recommends that you continue on your current medications as directed. Please refer to the Current Medication list given to you today.  Labwork: Today for kidney function, complete blood count, liver function, and fasting lipids  Testing/Procedures: None ordered   Follow-Up: Your physician wants you to follow-up in: 6 months with Dr. Radford Pax. You will receive a reminder letter in the mail two months in advance. If you don't receive a letter, please call our office to schedule the follow-up appointment.  Any Other Special Instructions Will Be Listed Below (If Applicable).     If you need a refill on your cardiac medications before your next appointment, please call your pharmacy.

## 2017-09-01 ENCOUNTER — Other Ambulatory Visit: Payer: Self-pay | Admitting: Internal Medicine

## 2017-09-01 DIAGNOSIS — I119 Hypertensive heart disease without heart failure: Secondary | ICD-10-CM

## 2017-09-06 ENCOUNTER — Telehealth: Payer: Self-pay | Admitting: Cardiology

## 2017-09-06 DIAGNOSIS — I10 Essential (primary) hypertension: Secondary | ICD-10-CM | POA: Diagnosis not present

## 2017-09-06 DIAGNOSIS — E7800 Pure hypercholesterolemia, unspecified: Secondary | ICD-10-CM | POA: Diagnosis not present

## 2017-09-06 DIAGNOSIS — L298 Other pruritus: Secondary | ICD-10-CM | POA: Diagnosis not present

## 2017-09-06 DIAGNOSIS — N762 Acute vulvitis: Secondary | ICD-10-CM | POA: Diagnosis not present

## 2017-09-06 DIAGNOSIS — E039 Hypothyroidism, unspecified: Secondary | ICD-10-CM | POA: Diagnosis not present

## 2017-09-06 NOTE — Telephone Encounter (Signed)
Returned call to patient. Patient stated she was advised to follow up with her cardiologist before taking estradiol with eliquis. Per Jinny Blossom, [facility] okay for patient to take. Explained the side effects of increase risk for blood clots with hormone replacements.  Patient verbalized understanding and thanked me for the call.

## 2017-09-06 NOTE — Telephone Encounter (Signed)
New message  Pt verbalized that she is calling for the RN to inform  Pt physician Dr.Rankin prescribed her the vaginal cream (pt spelled medication 2x ) Estradiol

## 2017-10-13 ENCOUNTER — Ambulatory Visit (INDEPENDENT_AMBULATORY_CARE_PROVIDER_SITE_OTHER): Payer: Medicare Other | Admitting: *Deleted

## 2017-10-13 DIAGNOSIS — I495 Sick sinus syndrome: Secondary | ICD-10-CM | POA: Diagnosis not present

## 2017-10-13 NOTE — Progress Notes (Signed)
Remote pacemaker transmission.   

## 2017-10-14 ENCOUNTER — Encounter: Payer: Self-pay | Admitting: Cardiology

## 2017-10-30 LAB — CUP PACEART REMOTE DEVICE CHECK
Battery Remaining Longevity: 96 mo
Brady Statistic AP VP Percent: 1 %
Brady Statistic AP VS Percent: 17 %
Brady Statistic AS VP Percent: 1 %
Brady Statistic RA Percent Paced: 17 %
Brady Statistic RV Percent Paced: 1 %
Date Time Interrogation Session: 20190314063524
Implantable Lead Implant Date: 20130328
Implantable Lead Location: 753859
Lead Channel Impedance Value: 360 Ohm
Lead Channel Impedance Value: 430 Ohm
Lead Channel Pacing Threshold Pulse Width: 0.5 ms
Lead Channel Sensing Intrinsic Amplitude: 4.3 mV
Lead Channel Setting Pacing Amplitude: 1.625
Lead Channel Setting Pacing Pulse Width: 0.7 ms
Lead Channel Setting Sensing Sensitivity: 2 mV
MDC IDC LEAD IMPLANT DT: 20130328
MDC IDC LEAD LOCATION: 753860
MDC IDC MSMT BATTERY REMAINING PERCENTAGE: 81 %
MDC IDC MSMT BATTERY VOLTAGE: 2.93 V
MDC IDC MSMT LEADCHNL RA PACING THRESHOLD AMPLITUDE: 0.625 V
MDC IDC MSMT LEADCHNL RA SENSING INTR AMPL: 4.7 mV
MDC IDC MSMT LEADCHNL RV PACING THRESHOLD AMPLITUDE: 1.25 V
MDC IDC MSMT LEADCHNL RV PACING THRESHOLD PULSEWIDTH: 0.7 ms
MDC IDC PG IMPLANT DT: 20130328
MDC IDC PG SERIAL: 7334985
MDC IDC SET LEADCHNL RV PACING AMPLITUDE: 2.5 V
MDC IDC STAT BRADY AS VS PERCENT: 83 %

## 2018-01-12 ENCOUNTER — Ambulatory Visit (INDEPENDENT_AMBULATORY_CARE_PROVIDER_SITE_OTHER): Payer: Medicare Other | Admitting: *Deleted

## 2018-01-12 DIAGNOSIS — I495 Sick sinus syndrome: Secondary | ICD-10-CM | POA: Diagnosis not present

## 2018-01-12 DIAGNOSIS — I10 Essential (primary) hypertension: Secondary | ICD-10-CM

## 2018-01-12 NOTE — Progress Notes (Signed)
Remote pacemaker transmission.   

## 2018-01-19 ENCOUNTER — Other Ambulatory Visit: Payer: Self-pay | Admitting: Cardiology

## 2018-01-25 ENCOUNTER — Encounter: Payer: Self-pay | Admitting: Internal Medicine

## 2018-01-25 ENCOUNTER — Ambulatory Visit (INDEPENDENT_AMBULATORY_CARE_PROVIDER_SITE_OTHER): Payer: Medicare Other | Admitting: Internal Medicine

## 2018-01-25 VITALS — BP 120/72 | HR 70 | Ht 63.0 in | Wt 180.0 lb

## 2018-01-25 DIAGNOSIS — I10 Essential (primary) hypertension: Secondary | ICD-10-CM

## 2018-01-25 DIAGNOSIS — I481 Persistent atrial fibrillation: Secondary | ICD-10-CM | POA: Diagnosis not present

## 2018-01-25 DIAGNOSIS — I4819 Other persistent atrial fibrillation: Secondary | ICD-10-CM

## 2018-01-25 DIAGNOSIS — Z950 Presence of cardiac pacemaker: Secondary | ICD-10-CM | POA: Diagnosis not present

## 2018-01-25 DIAGNOSIS — I2510 Atherosclerotic heart disease of native coronary artery without angina pectoris: Secondary | ICD-10-CM | POA: Diagnosis not present

## 2018-01-25 DIAGNOSIS — I495 Sick sinus syndrome: Secondary | ICD-10-CM | POA: Diagnosis not present

## 2018-01-25 NOTE — Progress Notes (Signed)
PCP: Leighton Ruff, MD Primary Cardiologist: Dr Radford Pax Primary EP:  Dr Rayann Heman  Cheryl [name] is a 72 y.o. female who presents today for routine electrophysiology followup.  Since last being seen in our clinic, the patient reports doing very well.  Today, she denies symptoms of palpitations, chest pain, shortness of breath,  lower extremity edema, dizziness, presyncope, or syncope.  The patient is otherwise without complaint today.   Past Medical History:  Diagnosis Date  . Anemia   . Arthritis   . Blood transfusion ~ 1962   " couple; no reaction " (05/30/2012)  . Coronary artery disease 2013   PCI PCI of OM  . Depression   . GERD (gastroesophageal reflux disease)   . H/O hiatal hernia   . HTN (hypertension)   . Hypercholesteremia   . Hypothyroidism   . Myocardial infarction [facility])    "very very light" (05/30/2012)  . PAF (paroxysmal atrial fibrillation) ([place]) 05/30/2012   s/p afib/flutter ablation 2013/2017  . Tachy-brady syndrome ([place]) 10/2011   a. post-termination pauses in setting of PAF;  b. 10/28/11 - St. Jude Dual Chamber PPM SN [id number]   . Ureteral obstruction, right    Past Surgical History:  Procedure Laterality Date  . ATRIAL FIBRILLATION ABLATION N/A 05/30/2012   PVI and CTi ablation by Dr Rayann Heman  . BREAST SURGERY  1990's   "right radial scar" (05/30/2012)  . CORONARY ANGIOPLASTY  ~ 04/2012  . CYSTOSCOPY WITH RETROGRADE PYELOGRAM, URETEROSCOPY AND STENT PLACEMENT  08/21/2012   Procedure: CYSTOSCOPY WITH RETROGRADE PYELOGRAM, URETEROSCOPY AND STENT PLACEMENT;  Surgeon: Franchot Gallo, MD;  [facility];  Service: Urology;  Laterality: Right;  URETEROSCOPY WITH BIOPSY   . ELECTROPHYSIOLOGIC STUDY N/A 09/23/2015   Procedure: Atrial Fibrillation Ablation;  Surgeon: Thompson Grayer, MD;  Location: [place] CV LAB;  Service: Cardiovascular;  Laterality: N/A;  . LEFT HEART CATHETERIZATION WITH CORONARY ANGIOGRAM N/A 04/04/2012   Procedure: LEFT HEART  CATHETERIZATION WITH CORONARY ANGIOGRAM;  Surgeon: Sinclair Grooms, MD;  Location[facility] CATH LAB;  Service: Cardiovascular;  Laterality: N/A;  . PERCUTANEOUS CORONARY INTERVENTION-BALLOON ONLY Right 04/04/2012   Procedure: PERCUTANEOUS CORONARY INTERVENTION-BALLOON ONLY;  Surgeon: Sinclair Grooms, MD;  Location: [facility] CATH LAB;  Service: Cardiovascular;  Laterality: Right;  . PERMANENT PACEMAKER INSERTION N/A 10/28/2011   Procedure: PERMANENT PACEMAKER INSERTION;  Surgeon: [name] Lance, MD;  Location: [facility] CATH LAB;  Service: Cardiovascular;  Laterality: N/A;  . TEE WITHOUT CARDIOVERSION  05/29/2012   Procedure: TRANSESOPHAGEAL ECHOCARDIOGRAM (TEE);  Surgeon: Sueanne Margarita, MD;  Location: [facility] ENDOSCOPY;  Service: Cardiovascular;  Laterality: N/A;  h/p in file drawer/dl    ROS- all systems are reviewed and negative except as per HPI above  Current Outpatient Medications  Medication Sig Dispense Refill  . ELIQUIS 5 MG TABS tablet TAKE 1 TABLET BY MOUTH TWICE DAILY 60 tablet 5  . levothyroxine (SYNTHROID, LEVOTHROID) 100 MCG tablet Take 100 mcg by mouth daily before breakfast.     . lisinopril (PRINIVIL,ZESTRIL) 10 MG tablet Take 1 tablet (10 mg total) by mouth daily. Please call and schedule a one year follow up appointment with Dr Jaretzi Droz 90 tablet 0  . metoprolol succinate (TOPROL-XL) 50 MG 24 hr tablet TAKE 1 TABLET BY MOUTH DAILY WITH OR IMMEDIATELY FOLLOWING A MEAL 90 tablet 3  . rosuvastatin (CRESTOR) 40 MG tablet TAKE 1 TABLET BY MOUTH DAILY 30 tablet 7   No current facility-administered medications for this visit.  Physical Exam: Vitals:   01/25/18 1552  BP: 120/72  Pulse: 70  SpO2: 97%  Weight: 180 lb (81.6 kg)  Height: 5\' 3"  (1.6 m)    GEN- The patient is well appearing, alert and oriented x 3 today.   Head- normocephalic, atraumatic Eyes-  Sclera clear, conjunctiva pink Ears- hearing intact Oropharynx- clear Lungs- Clear to ausculation bilaterally, normal work of  breathing Chest- pacemaker pocket is well healed Heart- Regular rate and rhythm, no murmurs, rubs or gallops, PMI not laterally displaced GI- soft, NT, ND, + BS Extremities- no clubbing, cyanosis, or edema  Pacemaker interrogation- reviewed in detail today,  See PACEART report  ekg tracing ordered today is personally reviewed and shows sinus rhythm, nonspecific ST/T changes  Assessment and Plan:  1. Symptomatic sinus bradycardia  Normal pacemaker function See Pace Art report No changes today  2. Atrial fibrillation Well controlled post ablation without recurrence off AAD therapy (<1% by device interrogation) On eliquis for chads2vasc score of 3.  3. HTN Stable No change required today  [place] Return to see EP PA every year Follow-up with Dr Radford Pax as scheduled  Thompson Grayer MD, [facility] 01/25/2018 4:19 PM

## 2018-01-25 NOTE — Patient Instructions (Signed)
Medication Instructions:  Your physician recommends that you continue on your current medications as directed. Please refer to the Current Medication list given to you today.  Labwork: None ordered.  Testing/Procedures: None ordered.  Follow-Up: Your physician wants you to follow-up in: one year with Tommye Standard, PA.   You will receive a reminder letter in the mail two months in advance. If you don't receive a letter, please call our office to schedule the follow-up appointment.  Remote monitoring is used to monitor your Pacemaker from home. This monitoring reduces the number of office visits required to check your device to one time per year. It allows [place] to keep an eye on the functioning of your device to ensure it is working properly. You are scheduled for a device check from home on 04/26/2018. You may send your transmission at any time that day. If you have a wireless device, the transmission will be sent automatically. After your physician reviews your transmission, you will receive a postcard with your next transmission date.  Any Other Special Instructions Will Be Listed Below (If Applicable).  If you need a refill on your cardiac medications before your next appointment, please call your pharmacy.

## 2018-02-03 LAB — CUP PACEART REMOTE DEVICE CHECK
Battery Remaining Longevity: 96 mo
Battery Remaining Percentage: 81 %
Brady Statistic AP VS Percent: 18 %
Brady Statistic AS VP Percent: 1 %
Brady Statistic AS VS Percent: 82 %
Brady Statistic RA Percent Paced: 18 %
Brady Statistic RV Percent Paced: 1 %
Implantable Lead Implant Date: 20130328
Implantable Lead Location: 753860
Implantable Pulse Generator Implant Date: 20130328
Lead Channel Impedance Value: 380 Ohm
Lead Channel Pacing Threshold Amplitude: 1.25 V
Lead Channel Pacing Threshold Pulse Width: 0.5 ms
Lead Channel Sensing Intrinsic Amplitude: 4.9 mV
Lead Channel Setting Pacing Amplitude: 1.625
Lead Channel Setting Pacing Amplitude: 2.5 V
Lead Channel Setting Pacing Pulse Width: 0.7 ms
Lead Channel Setting Sensing Sensitivity: 2 mV
MDC IDC LEAD IMPLANT DT: 20130328
MDC IDC LEAD LOCATION: 753859
MDC IDC MSMT BATTERY VOLTAGE: 2.93 V
MDC IDC MSMT LEADCHNL RA IMPEDANCE VALUE: 430 Ohm
MDC IDC MSMT LEADCHNL RA PACING THRESHOLD AMPLITUDE: 0.625 V
MDC IDC MSMT LEADCHNL RA SENSING INTR AMPL: 4.9 mV
MDC IDC MSMT LEADCHNL RV PACING THRESHOLD PULSEWIDTH: 0.7 ms
MDC IDC PG SERIAL: 7334985
MDC IDC SESS DTM: 20190613071103
MDC IDC STAT BRADY AP VP PERCENT: 1 %
Pulse Gen Model: 2210

## 2018-03-03 ENCOUNTER — Encounter: Payer: Self-pay | Admitting: Cardiology

## 2018-03-03 ENCOUNTER — Ambulatory Visit (INDEPENDENT_AMBULATORY_CARE_PROVIDER_SITE_OTHER): Payer: Medicare Other | Admitting: Cardiology

## 2018-03-03 VITALS — BP 114/68 | HR 68 | Ht 63.0 in | Wt 176.4 lb

## 2018-03-03 DIAGNOSIS — I495 Sick sinus syndrome: Secondary | ICD-10-CM

## 2018-03-03 DIAGNOSIS — I10 Essential (primary) hypertension: Secondary | ICD-10-CM

## 2018-03-03 DIAGNOSIS — E7800 Pure hypercholesterolemia, unspecified: Secondary | ICD-10-CM

## 2018-03-03 DIAGNOSIS — I2510 Atherosclerotic heart disease of native coronary artery without angina pectoris: Secondary | ICD-10-CM | POA: Diagnosis not present

## 2018-03-03 DIAGNOSIS — I481 Persistent atrial fibrillation: Secondary | ICD-10-CM | POA: Diagnosis not present

## 2018-03-03 DIAGNOSIS — I4819 Other persistent atrial fibrillation: Secondary | ICD-10-CM

## 2018-03-03 NOTE — Progress Notes (Signed)
Cardiology Office Note:    Date:  03/03/2018   ID:  Cheryl [name], DOB 12-25-1945, MRN [id number]  PCP:  Leighton Ruff, MD  Cardiologist:  No primary care provider on file.    Referring MD: Leighton Ruff, MD   Chief Complaint  Patient presents with  . Atrial Fibrillation  . Hypertension  . Coronary Artery Disease  . Hyperlipidemia    History of Present Illness:    Cheryl [name] is a 72 y.o. female with a hx of PAF/flutter s/p ablationx 2, tachy/brady syndrome s/p PPM, HTN, ASCAD s/p PCI of OM and dyslipidemia.  She is here today for followup and is doing well.  She denies any chest pain or pressure, SOB, DOE, PND, orthopnea, LE edema, dizziness, palpitations or syncope. She is compliant with her meds and is tolerating meds with no SE.      Past Medical History:  Diagnosis Date  . Anemia   . Arthritis   . Blood transfusion ~ 1962   " couple; no reaction " (05/30/2012)  . Coronary artery disease 2013   PCI PCI of OM  . Depression   . GERD (gastroesophageal reflux disease)   . H/O hiatal hernia   . HTN (hypertension)   . Hypercholesteremia   . Hypothyroidism   . Myocardial infarction [facility])    "very very light" (05/30/2012)  . PAF (paroxysmal atrial fibrillation) ([place]) 05/30/2012   s/p afib/flutter ablation 2013/2017  . Tachy-brady syndrome ([place]) 10/2011   a. post-termination pauses in setting of PAF;  b. 10/28/11 - [employer] Jude Dual Chamber PPM SN [id number]   . Ureteral obstruction, right     Past Surgical History:  Procedure Laterality Date  . ATRIAL FIBRILLATION ABLATION N/A 05/30/2012   PVI and CTi ablation by Dr Rayann Heman  . BREAST SURGERY  1990's   "right radial scar" (05/30/2012)  . CORONARY ANGIOPLASTY  ~ 04/2012  . CYSTOSCOPY WITH RETROGRADE PYELOGRAM, URETEROSCOPY AND STENT PLACEMENT  08/21/2012   Procedure: CYSTOSCOPY WITH RETROGRADE PYELOGRAM, URETEROSCOPY AND STENT PLACEMENT;  Surgeon: Franchot Gallo, MD;  Location: [facility] ORS;  Service: Urology;  Laterality:  Right;  URETEROSCOPY WITH BIOPSY   . ELECTROPHYSIOLOGIC STUDY N/A 09/23/2015   Procedure: Atrial Fibrillation Ablation;  Surgeon: Thompson Grayer, MD;  Location: [facility] CV LAB;  Service: Cardiovascular;  Laterality: N/A;  . LEFT HEART CATHETERIZATION WITH CORONARY ANGIOGRAM N/A 04/04/2012   Procedure: LEFT HEART CATHETERIZATION WITH CORONARY ANGIOGRAM;  Surgeon: Sinclair Grooms, MD;  Location: [facility] CATH LAB;  Service: Cardiovascular;  Laterality: N/A;  . PERCUTANEOUS CORONARY INTERVENTION-BALLOON ONLY Right 04/04/2012   Procedure: PERCUTANEOUS CORONARY INTERVENTION-BALLOON ONLY;  Surgeon: Sinclair Grooms, MD;  Location: [facility] CATH LAB;  Service: Cardiovascular;  Laterality: Right;  . PERMANENT PACEMAKER INSERTION N/A 10/28/2011   Procedure: PERMANENT PACEMAKER INSERTION;  Surgeon: [name] Lance, MD;  Location: [facility] CATH LAB;  Service: Cardiovascular;  Laterality: N/A;  . TEE WITHOUT CARDIOVERSION  05/29/2012   Procedure: TRANSESOPHAGEAL ECHOCARDIOGRAM (TEE);  Surgeon: Sueanne Margarita, MD;  Location: [facility] ENDOSCOPY;  Service: Cardiovascular;  Laterality: N/A;  h/p in file drawer/dl    Current Medications: No outpatient medications have been marked as taking for the 03/03/18 encounter (Office Visit) with Sueanne Margarita, MD.     Allergies:   Latex; Protonix [pantoprazole sodium]; Codeine; and Tape   Social History   Socioeconomic History  . Marital status: Married    Spouse name: Not on file  . Number of children: 4  . Years  of education: Not on file  . Highest education level: Not on file  Occupational History  . Not on file  Social Needs  . Financial resource strain: Not on file  . Food insecurity:    Worry: Not on file    Inability: Not on file  . Transportation needs:    Medical: Not on file    Non-medical: Not on file  Tobacco Use  . Smoking status: Former Smoker    Packs/day: 0.12    Years: 15.00    Pack years: 1.80    Types: Cigarettes    Last attempt to quit: 06/17/1977     Years since quitting: 40.7  . Smokeless tobacco: Never Used  . Tobacco comment: QUIT smoking cigarettes 1978  Substance and Sexual Activity  . Alcohol use: No  . Drug use: No  . Sexual activity: Never    Birth control/protection: Post-menopausal  Lifestyle  . Physical activity:    Days per week: Not on file    Minutes per session: Not on file  . Stress: Not on file  Relationships  . Social connections:    Talks on phone: Not on file    Gets together: Not on file    Attends religious service: Not on file    Active member of club or organization: Not on file    Attends meetings of clubs or organizations: Not on file    Relationship status: Not on file  Other Topics Concern  . Not on file  Social History Narrative   Lives with husband.  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     Family History: The patient's family history includes Breast cancer in her mother; Heart attack (age of onset: 7) in her father; Heart attack (age of onset: 44) in her mother; Pancreatic cancer in her mother; Uterine cancer in her mother.  ROS:   Please see the history of present illness.    ROS  All other systems reviewed and negative.   EKGs/Labs/Other Studies Reviewed:    The following studies were reviewed today: none  EKG:  EKG is not ordered today.    Recent Labs: 08/29/2017: ALT 12; BUN 12; Creatinine, Ser 0.76; Hemoglobin 13.4; Platelets 284; Potassium 4.1; Sodium 142   Recent Lipid Panel    Component Value Date/Time   CHOL 148 08/29/2017 0839   TRIG 97 08/29/2017 0839   HDL 64 08/29/2017 0839   CHOLHDL 2.3 08/29/2017 0839   CHOLHDL 2.0 05/21/2016 1100   VLDL 12 05/21/2016 1100   LDLCALC 65 08/29/2017 0839    Physical Exam:    VS:  BP 114/68   Pulse 68   Ht 5\' 3"  (1.6 m)   Wt 176 lb 6.4 oz (80 kg)   SpO2 95%   BMI 31.25 kg/m     Wt Readings from Last 3 Encounters:  03/03/18 176 lb 6.4 oz (80 kg)  01/25/18 180 lb (81.6 kg)  08/29/17 181 lb 3.2 oz (82.2 kg)     GEN:   Well nourished, well developed in no acute distress HEENT: Normal NECK: No JVD; No carotid bruits LYMPHATICS: No lymphadenopathy CARDIAC: RRR, no murmurs, rubs, gallops RESPIRATORY:  Clear to auscultation without rales, wheezing or rhonchi  ABDOMEN: Soft, non-tender, non-distended MUSCULOSKELETAL:  No edema; No deformity  SKIN: Warm and dry NEUROLOGIC:  Alert and oriented x 3 PSYCHIATRIC:  Normal affect   ASSESSMENT:    1. Coronary artery disease involving native coronary artery of native heart without angina pectoris  2. Persistent atrial fibrillation ([place])   3. Essential hypertension   4. Hypercholesteremia   5. Tachy-brady syndrome ([place])    PLAN:    In order of problems listed above:  1.  ASCAD -  s/p PCI of OM.  She has not had any anginal sx.  She will continue on BB and statin.  She is not on ASA due to DOAC.  2. Persistent atrial fibrillation - She is maintaining NSR on exam.  She will continue on Toprol and Apixaban 5mg  BID.  Creatinine stable at 0.77.  I will check a Hbg today  3. HTN - BP controlled on exam today.  She will continue on Toprol XL 50mg  daily and Lisinopril 10mg  daily.  Her creatinine was stable at 0.77 on 09/06/2017.    4. Hyperlipidemia - LDL goal < 70.  Her LDL was 73 on 09/06/2017.  Continue crestory 40mg  daily.    5. Tachy-brady syndrome- s/p PPM followed in device clinic.    Medication Adjustments/Labs and Tests Ordered: Current medicines are reviewed at length with the patient today.  Concerns regarding medicines are outlined above.  No orders of the defined types were placed in this encounter.  No orders of the defined types were placed in this encounter.   Signed, Fransico Him, MD  03/03/2018 9:35 AM    [place]

## 2018-03-03 NOTE — Patient Instructions (Signed)
Medication Instructions:  Your physician recommends that you continue on your current medications as directed. Please refer to the Current Medication list given to you today.   Labwork: TODAY: CBC  Testing/Procedures: None ordered  Follow-Up: Your physician wants you to follow-up in: 6 months with Dr. Radford Pax. You will receive a reminder letter in the mail two months in advance. If you don't receive a letter, please call our office to schedule the follow-up appointment.   Any Other Special Instructions Will Be Listed Below (If Applicable).     If you need a refill on your cardiac medications before your next appointment, please call your pharmacy.

## 2018-03-04 LAB — CBC
Hematocrit: 41.1 % (ref 34.0–46.6)
Hemoglobin: 13.4 g/dL (ref 11.1–15.9)
MCH: 27.6 pg (ref 26.6–33.0)
MCHC: 32.6 g/dL (ref 31.5–35.7)
MCV: 85 fL (ref 79–97)
PLATELETS: 305 10*3/uL (ref 150–450)
RBC: 4.85 x10E6/uL (ref 3.77–5.28)
RDW: 13.1 % (ref 12.3–15.4)
WBC: 6.8 10*3/uL (ref 3.4–10.8)

## 2018-03-09 DIAGNOSIS — M170 Bilateral primary osteoarthritis of knee: Secondary | ICD-10-CM | POA: Diagnosis not present

## 2018-03-20 ENCOUNTER — Telehealth: Payer: Self-pay | Admitting: Internal Medicine

## 2018-03-20 NOTE — Telephone Encounter (Signed)
Reached Ms. Vokes via mobile number- she left home phone off the hook. I assisted her in sending a manual transmission. No AF, 2 PVCs noted on "freeze capture" during transmission. <1% (22k) PVCs since January 25, 2018. She is relieved that she has not had AF.

## 2018-03-20 NOTE — Telephone Encounter (Signed)
New message  Patient states that she had cortisone shot on Aug. 2, 2019. Patient states that her afib has come back since having the shot. Patient wants to know if the cortizone shot may have caused the afib to return. Please advise.

## 2018-03-21 DIAGNOSIS — M170 Bilateral primary osteoarthritis of knee: Secondary | ICD-10-CM | POA: Diagnosis not present

## 2018-03-28 DIAGNOSIS — M170 Bilateral primary osteoarthritis of knee: Secondary | ICD-10-CM | POA: Diagnosis not present

## 2018-04-04 DIAGNOSIS — M170 Bilateral primary osteoarthritis of knee: Secondary | ICD-10-CM | POA: Diagnosis not present

## 2018-04-14 LAB — CUP PACEART INCLINIC DEVICE CHECK
Battery Voltage: 2.93 V
Brady Statistic RA Percent Paced: 18 %
Brady Statistic RV Percent Paced: 0.08 %
Date Time Interrogation Session: 20190626202500
Implantable Lead Implant Date: 20130328
Implantable Lead Implant Date: 20130328
Implantable Lead Location: 753859
Implantable Lead Location: 753860
Implantable Pulse Generator Implant Date: 20130328
Lead Channel Impedance Value: 412.5 Ohm
Lead Channel Pacing Threshold Pulse Width: 0.7 ms
Lead Channel Sensing Intrinsic Amplitude: 5 mV
Lead Channel Setting Pacing Amplitude: 1.625
Lead Channel Setting Pacing Amplitude: 2.5 V
Lead Channel Setting Pacing Pulse Width: 0.7 ms
MDC IDC MSMT BATTERY REMAINING LONGEVITY: 114 mo
MDC IDC MSMT LEADCHNL RA PACING THRESHOLD AMPLITUDE: 0.75 V
MDC IDC MSMT LEADCHNL RA PACING THRESHOLD PULSEWIDTH: 0.5 ms
MDC IDC MSMT LEADCHNL RV IMPEDANCE VALUE: 375 Ohm
MDC IDC MSMT LEADCHNL RV PACING THRESHOLD AMPLITUDE: 1.25 V
MDC IDC MSMT LEADCHNL RV SENSING INTR AMPL: 5 mV
MDC IDC SET LEADCHNL RV SENSING SENSITIVITY: 2 mV
Pulse Gen Model: 2210
Pulse Gen Serial Number: 7334985

## 2018-04-26 ENCOUNTER — Telehealth: Payer: Self-pay

## 2018-04-26 ENCOUNTER — Ambulatory Visit (INDEPENDENT_AMBULATORY_CARE_PROVIDER_SITE_OTHER): Payer: Medicare Other | Admitting: *Deleted

## 2018-04-26 DIAGNOSIS — I495 Sick sinus syndrome: Secondary | ICD-10-CM

## 2018-04-26 NOTE — Telephone Encounter (Signed)
Spoke with pt and reminded pt of remote transmission that is due today. Pt verbalized understanding.   

## 2018-04-27 NOTE — Progress Notes (Signed)
Remote pacemaker transmission.   

## 2018-04-28 ENCOUNTER — Encounter: Payer: Self-pay | Admitting: Cardiology

## 2018-05-08 DIAGNOSIS — H6120 Impacted cerumen, unspecified ear: Secondary | ICD-10-CM | POA: Diagnosis not present

## 2018-05-08 DIAGNOSIS — R05 Cough: Secondary | ICD-10-CM | POA: Diagnosis not present

## 2018-05-26 LAB — CUP PACEART REMOTE DEVICE CHECK
Battery Voltage: 2.93 V
Brady Statistic AP VP Percent: 1 %
Brady Statistic AS VP Percent: 1 %
Brady Statistic AS VS Percent: 67 %
Date Time Interrogation Session: 20190925203704
Implantable Lead Implant Date: 20130328
Implantable Lead Location: 753860
Implantable Pulse Generator Implant Date: 20130328
Lead Channel Impedance Value: 340 Ohm
Lead Channel Pacing Threshold Amplitude: 0.75 V
Lead Channel Pacing Threshold Amplitude: 1.25 V
Lead Channel Pacing Threshold Pulse Width: 0.7 ms
Lead Channel Sensing Intrinsic Amplitude: 4.3 mV
Lead Channel Setting Pacing Amplitude: 1.75 V
Lead Channel Setting Pacing Amplitude: 2.5 V
Lead Channel Setting Pacing Pulse Width: 0.7 ms
MDC IDC LEAD IMPLANT DT: 20130328
MDC IDC LEAD LOCATION: 753859
MDC IDC MSMT BATTERY REMAINING LONGEVITY: 92 mo
MDC IDC MSMT BATTERY REMAINING PERCENTAGE: 81 %
MDC IDC MSMT LEADCHNL RA IMPEDANCE VALUE: 360 Ohm
MDC IDC MSMT LEADCHNL RA PACING THRESHOLD PULSEWIDTH: 0.5 ms
MDC IDC MSMT LEADCHNL RA SENSING INTR AMPL: 4 mV
MDC IDC SET LEADCHNL RV SENSING SENSITIVITY: 2 mV
MDC IDC STAT BRADY AP VS PERCENT: 33 %
MDC IDC STAT BRADY RA PERCENT PACED: 32 %
MDC IDC STAT BRADY RV PERCENT PACED: 1 %
Pulse Gen Model: 2210
Pulse Gen Serial Number: 7334985

## 2018-05-30 DIAGNOSIS — E039 Hypothyroidism, unspecified: Secondary | ICD-10-CM | POA: Diagnosis not present

## 2018-05-30 DIAGNOSIS — I10 Essential (primary) hypertension: Secondary | ICD-10-CM | POA: Diagnosis not present

## 2018-05-30 DIAGNOSIS — E7800 Pure hypercholesterolemia, unspecified: Secondary | ICD-10-CM | POA: Diagnosis not present

## 2018-05-30 DIAGNOSIS — Z23 Encounter for immunization: Secondary | ICD-10-CM | POA: Diagnosis not present

## 2018-07-17 ENCOUNTER — Other Ambulatory Visit: Payer: Self-pay | Admitting: Cardiology

## 2018-07-19 DIAGNOSIS — M1712 Unilateral primary osteoarthritis, left knee: Secondary | ICD-10-CM | POA: Diagnosis not present

## 2018-07-27 ENCOUNTER — Ambulatory Visit (INDEPENDENT_AMBULATORY_CARE_PROVIDER_SITE_OTHER): Payer: Medicare Other

## 2018-07-27 DIAGNOSIS — I495 Sick sinus syndrome: Secondary | ICD-10-CM

## 2018-07-27 NOTE — Progress Notes (Signed)
Remote pacemaker transmission.   

## 2018-07-28 LAB — CUP PACEART REMOTE DEVICE CHECK
Battery Remaining Longevity: 84 mo
Battery Remaining Percentage: 73 %
Brady Statistic AP VS Percent: 25 %
Brady Statistic AS VP Percent: 1 %
Brady Statistic AS VS Percent: 75 %
Brady Statistic RA Percent Paced: 25 %
Brady Statistic RV Percent Paced: 1 %
Implantable Lead Implant Date: 20130328
Lead Channel Impedance Value: 360 Ohm
Lead Channel Pacing Threshold Amplitude: 1.25 V
Lead Channel Pacing Threshold Pulse Width: 0.5 ms
Lead Channel Pacing Threshold Pulse Width: 0.7 ms
Lead Channel Sensing Intrinsic Amplitude: 3.6 mV
Lead Channel Sensing Intrinsic Amplitude: 3.8 mV
Lead Channel Setting Pacing Amplitude: 1.75 V
Lead Channel Setting Pacing Amplitude: 2.5 V
Lead Channel Setting Pacing Pulse Width: 0.7 ms
MDC IDC LEAD IMPLANT DT: 20130328
MDC IDC LEAD LOCATION: 753859
MDC IDC LEAD LOCATION: 753860
MDC IDC MSMT BATTERY VOLTAGE: 2.92 V
MDC IDC MSMT LEADCHNL RA PACING THRESHOLD AMPLITUDE: 0.75 V
MDC IDC MSMT LEADCHNL RV IMPEDANCE VALUE: 350 Ohm
MDC IDC PG IMPLANT DT: 20130328
MDC IDC PG SERIAL: 7334985
MDC IDC SESS DTM: 20191225082010
MDC IDC SET LEADCHNL RV SENSING SENSITIVITY: 2 mV
MDC IDC STAT BRADY AP VP PERCENT: 1 %

## 2018-08-01 ENCOUNTER — Encounter: Payer: Self-pay | Admitting: Cardiology

## 2018-08-04 DIAGNOSIS — H524 Presbyopia: Secondary | ICD-10-CM | POA: Diagnosis not present

## 2018-08-04 DIAGNOSIS — H52223 Regular astigmatism, bilateral: Secondary | ICD-10-CM | POA: Diagnosis not present

## 2018-08-04 DIAGNOSIS — H40033 Anatomical narrow angle, bilateral: Secondary | ICD-10-CM | POA: Diagnosis not present

## 2018-08-04 DIAGNOSIS — H2513 Age-related nuclear cataract, bilateral: Secondary | ICD-10-CM | POA: Diagnosis not present

## 2018-09-14 DIAGNOSIS — H6123 Impacted cerumen, bilateral: Secondary | ICD-10-CM | POA: Diagnosis not present

## 2018-09-14 DIAGNOSIS — R509 Fever, unspecified: Secondary | ICD-10-CM | POA: Diagnosis not present

## 2018-09-14 DIAGNOSIS — J069 Acute upper respiratory infection, unspecified: Secondary | ICD-10-CM | POA: Diagnosis not present

## 2018-09-25 DIAGNOSIS — R300 Dysuria: Secondary | ICD-10-CM | POA: Diagnosis not present

## 2018-10-20 ENCOUNTER — Encounter: Payer: Self-pay | Admitting: Cardiology

## 2018-10-26 ENCOUNTER — Other Ambulatory Visit: Payer: Self-pay

## 2018-10-26 ENCOUNTER — Ambulatory Visit (INDEPENDENT_AMBULATORY_CARE_PROVIDER_SITE_OTHER): Payer: Medicare HMO | Admitting: *Deleted

## 2018-10-26 DIAGNOSIS — I495 Sick sinus syndrome: Secondary | ICD-10-CM | POA: Diagnosis not present

## 2018-10-26 LAB — CUP PACEART REMOTE DEVICE CHECK
Battery Remaining Longevity: 84 mo
Brady Statistic AP VP Percent: 1 %
Brady Statistic AP VS Percent: 27 %
Brady Statistic AS VP Percent: 1 %
Brady Statistic AS VS Percent: 73 %
Brady Statistic RV Percent Paced: 1 %
Date Time Interrogation Session: 20200325065053
Implantable Lead Location: 753859
Lead Channel Impedance Value: 350 Ohm
Lead Channel Pacing Threshold Pulse Width: 0.5 ms
Lead Channel Sensing Intrinsic Amplitude: 3.4 mV
Lead Channel Sensing Intrinsic Amplitude: 3.7 mV
Lead Channel Setting Pacing Amplitude: 1.625
Lead Channel Setting Sensing Sensitivity: 2 mV
MDC IDC LEAD IMPLANT DT: 20130328
MDC IDC LEAD IMPLANT DT: 20130328
MDC IDC LEAD LOCATION: 753860
MDC IDC MSMT BATTERY REMAINING PERCENTAGE: 73 %
MDC IDC MSMT BATTERY VOLTAGE: 2.92 V
MDC IDC MSMT LEADCHNL RA IMPEDANCE VALUE: 360 Ohm
MDC IDC MSMT LEADCHNL RA PACING THRESHOLD AMPLITUDE: 0.625 V
MDC IDC MSMT LEADCHNL RV PACING THRESHOLD AMPLITUDE: 1.25 V
MDC IDC MSMT LEADCHNL RV PACING THRESHOLD PULSEWIDTH: 0.7 ms
MDC IDC PG IMPLANT DT: 20130328
MDC IDC PG SERIAL: 7334985
MDC IDC SET LEADCHNL RV PACING AMPLITUDE: 2.5 V
MDC IDC SET LEADCHNL RV PACING PULSEWIDTH: 0.7 ms
MDC IDC STAT BRADY RA PERCENT PACED: 27 %
Pulse Gen Model: 2210

## 2018-10-27 ENCOUNTER — Telehealth: Payer: Self-pay

## 2018-10-27 NOTE — Telephone Encounter (Signed)
Spoke with patient and she is feeling fine and symptoms are controlled. Advised patient her appointment would be around May/June time she accepted. Advised patient to call office is symptoms got worse.

## 2018-10-31 ENCOUNTER — Ambulatory Visit: Payer: Medicare HMO | Admitting: Cardiology

## 2018-11-02 NOTE — Progress Notes (Signed)
Remote pacemaker transmission.   

## 2018-11-14 ENCOUNTER — Telehealth: Payer: Self-pay | Admitting: Physician Assistant

## 2018-11-14 NOTE — Telephone Encounter (Signed)
Patient scheduled for 4/16 e visit. See phone note from today. Will remove from C19 pool. Dayna Dunn PA-C

## 2018-11-14 NOTE — Telephone Encounter (Signed)
Per review of C19 pool, patient was identified as candidate for tele-health visit reschedule. She agrees to visit on 4/16 at 1pm with Dr. Radford Pax.  I personally completed the pre-call below - does not need any other contact until day of visit.  Virtual Visit Pre-Appointment Phone Call  Steps For Call:  1. Confirm consent - "In the setting of the current Covid19 crisis, you are scheduled for a (phone or video) visit with your provider on (date) at (time).  Just as we do with many in-office visits, in order for you to participate in this visit, we must obtain consent.  If you'd like, I can send this to your mychart (if signed up) or email for you to review.  Otherwise, I can obtain your verbal consent now.  All virtual visits are billed to your insurance company just like a normal visit would be.  By agreeing to a virtual visit, we'd like you to understand that the technology does not allow for your provider to perform an examination, and thus may limit your provider's ability to fully assess your condition.  Finally, though the technology is pretty good, we cannot assure that it will always work on either your or our end, and in the setting of a video visit, we may have to convert it to a phone-only visit.  In either situation, we cannot ensure that we have a secure connection.  Are you willing to proceed?" STAFF: Did the patient verbally acknowledge consent to telehealth visit? Document YES/NO here: YES  2. Confirm the BEST phone number to call the day of the visit by including in appointment notes - I had contacted patient via home phone at [phone] but she will be using Android ([phone]) for visit  3. Give patient instructions for WebEx/MyChart download to smartphone as below or Doximity/Doxy.me if video visit (depending on what platform provider is using) - Doxy.me per d/w Dr. Theodosia Blender nurse  4. Advise patient to be prepared with their blood pressure, heart rate, weight, any heart  rhythm information, their current medicines, and a piece of paper and pen handy for any instructions they may receive the day of their visit  5. Inform patient they will receive a phone call 15 minutes prior to their appointment time (may be from unknown caller ID) so they should be prepared to answer  6. Confirm that appointment type is correct in Epic appointment notes (VIDEO vs PHONE)     TELEPHONE CALL NOTE  Cheryl [name] has been deemed a candidate for a follow-up tele-health visit to limit community exposure during the Covid-19 pandemic. I spoke with the patient via phone to ensure availability of phone/video source, confirm preferred email & phone number, and discuss instructions and expectations.  I reminded Cheryl [name] to be prepared with any vital sign and/or heart rhythm information that could potentially be obtained via home monitoring, at the time of her visit. I reminded Cheryl [name] to expect a phone call at the time of her visit if her visit.  [name], PA-C 11/14/2018 5:06 PM   INSTRUCTIONS FOR DOWNLOADING THE [facility] APP TO SMARTPHONE  - If Apple, ask patient to go to [employer] and type in WebEx in the search bar. [employer], the blue/green circle. If Android, go to [employer] and type in [employer] in the search bar. The app is free but as with any other app downloads, their phone may require them to verify saved payment information  or Apple/Android password.  - The patient does NOT have to create an account. - On the day of the visit, the assist will walk the patient through joining the meeting with the meeting number/password.  INSTRUCTIONS FOR DOWNLOADING THE MYCHART APP TO SMARTPHONE  - The patient must first make sure to have activated MyChart and know their login information - If Apple, go to [employer] and type in MyChart in the search bar and download the app. If Android, ask patient to go to [employer] and type in [place] in the  search bar and download the app. The app is free but as with any other app downloads, their phone may require them to verify saved payment information or Apple/Android password.  - The patient will need to then log into the app with their MyChart username and password, and select [facility] as their healthcare provider to link the account. When it is time for your visit, go to the MyChart app, find appointments, and click Begin Video Visit. Be sure to Select Allow for your device to access the Microphone and Camera for your visit. You will then be connected, and your provider will be with you shortly.  **If they have any issues connecting, or need assistance please contact MyChart service desk [phone])**  **If using a computer, in order to ensure the best quality for their visit they will need to use either of the following Internet Browsers: [employer], or Google Chrome**  IF USING DOXIMITY or DOXY.ME - The patient will receive a link just prior to their visit, either by text or email (to be determined day of appointment depending on if it's doxy.me or Doximity).     FULL LENGTH CONSENT FOR TELE-HEALTH VISIT   I hereby voluntarily request, consent and authorize [facility] and its employed or contracted physicians, physician assistants, nurse practitioners or other licensed health care professionals (the Practitioner), to provide me with telemedicine health care services (the "Services") as deemed necessary by the treating Practitioner. I acknowledge and consent to receive the Services by the Practitioner via telemedicine. I understand that the telemedicine visit will involve communicating with the Practitioner through live audiovisual communication technology and the disclosure of certain medical information by electronic transmission. I acknowledge that I have been given the opportunity to request an in-person assessment or other available alternative prior to the  telemedicine visit and am voluntarily participating in the telemedicine visit.  I understand that I have the right to withhold or withdraw my consent to the use of telemedicine in the course of my care at any time, without affecting my right to future care or treatment, and that the Practitioner or I may terminate the telemedicine visit at any time. I understand that I have the right to inspect all information obtained and/or recorded in the course of the telemedicine visit and may receive copies of available information for a reasonable fee.  I understand that some of the potential risks of receiving the Services via telemedicine include:  [place] Delay or interruption in medical evaluation due to technological equipment failure or disruption; . Information transmitted may not be sufficient (e.g. poor resolution of images) to allow for appropriate medical decision making by the Practitioner; and/or  . In rare instances, security protocols could fail, causing a breach of personal health information.  Furthermore, I acknowledge that it is my responsibility to provide information about my medical history, conditions and care that is complete and accurate to the best of my  ability. I acknowledge that Practitioner's advice, recommendations, and/or decision may be based on factors not within their control, such as incomplete or inaccurate data provided by me or distortions of diagnostic images or specimens that may result from electronic transmissions. I understand that the practice of medicine is not an exact science and that Practitioner makes no warranties or guarantees regarding treatment outcomes. I acknowledge that I will receive a copy of this consent concurrently upon execution via email to the email address I last provided but may also request a printed copy by calling the office of [facility].    I understand that my insurance will be billed for this visit.   I have read or had this consent read to me.  . I understand the contents of this consent, which adequately explains the benefits and risks of the Services being provided via telemedicine.  . I have been provided ample opportunity to ask questions regarding this consent and the Services and have had my questions answered to my satisfaction. . I give my informed consent for the services to be provided through the use of telemedicine in my medical care  By participating in this telemedicine visit I agree to the above.

## 2018-11-15 NOTE — Progress Notes (Signed)
Virtual Visit via Video Note   This visit type was conducted due to national recommendations for restrictions regarding the COVID-19 Pandemic (e.g. social distancing) in an effort to limit this patient's exposure and mitigate transmission in our community.  Due to her co-morbid illnesses, this patient is at least at moderate risk for complications without adequate follow up.  This format is felt to be most appropriate for this patient at this time.  All issues noted in this document were discussed and addressed.  A limited physical exam was performed with this format.  Please refer to the patient's chart for her consent to telehealth for [facility].  Evaluation Performed:  Follow-up visit  This visit type was conducted due to national recommendations for restrictions regarding the COVID-19 Pandemic (e.g. social distancing).  This format is felt to be most appropriate for this patient at this time.  All issues noted in this document were discussed and addressed.  No physical exam was performed (except for noted visual exam findings with Video Visits).  Please refer to the patient's chart (MyChart message for video visits and phone note for telephone visits) for the patient's consent to telehealth for [facility].  Date:  11/16/2018   ID:  Cheryl [name], DOB 14-Nov-1945, MRN [id number]  Patient Location:  Home  Provider location:   [place]  PCP:  Leighton Ruff, MD  Cardiologist:   Electrophysiologist:  None   Chief Complaint:  Atrial fib, HTN, CAD and hyperlipidemia  History of Present Illness:    Cheryl [name] is a 73 y.o. female who presents via audio/video conferencing for a telehealth visit today.    Cheryl [name] is a 73 y.o. female with a hx of PAF/flutter s/p ablationx 2, tachy/brady syndrome s/p PPM, HTN, ASCADs/p PCI of OMand dyslipidemia. She is here today for followup and is doing well.  She denies any chest pain or pressure, SOB, DOE, PND, orthopnea, LE edema,  dizziness, palpitations or syncope. She is compliant with her meds and is tolerating meds with no SE.    The patient does not have symptoms concerning for COVID-19 infection (fever, chills, cough, or new shortness of breath).    Prior CV studies:   The following studies were reviewed today:  none  Past Medical History:  Diagnosis Date  . Anemia   . Arthritis   . Blood transfusion ~ 1962   " couple; no reaction " (05/30/2012)  . Coronary artery disease 2013   PCI PCI of OM  . Depression   . GERD (gastroesophageal reflux disease)   . H/O hiatal hernia   . HTN (hypertension)   . Hypercholesteremia   . Hypothyroidism   . Myocardial infarction [facility])    "very very light" (05/30/2012)  . PAF (paroxysmal atrial fibrillation) ([place]) 05/30/2012   s/p afib/flutter ablation 2013/2017  . Tachy-brady syndrome ([place]) 10/2011   a. post-termination pauses in setting of PAF;  b. 10/28/11 - St. Jude Dual Chamber PPM SN [id number]   . Ureteral obstruction, right    Past Surgical History:  Procedure Laterality Date  . ATRIAL FIBRILLATION ABLATION N/A 05/30/2012   PVI and CTi ablation by Dr Rayann Heman  . BREAST SURGERY  1990's   "right radial scar" (05/30/2012)  . CORONARY ANGIOPLASTY  ~ 04/2012  . CYSTOSCOPY WITH RETROGRADE PYELOGRAM, URETEROSCOPY AND STENT PLACEMENT  08/21/2012   Procedure: CYSTOSCOPY WITH RETROGRADE PYELOGRAM, URETEROSCOPY AND STENT PLACEMENT;  Surgeon: Franchot Gallo, MD;  Location: [facility];  Service: Urology;  Laterality: Right;  URETEROSCOPY WITH BIOPSY   . ELECTROPHYSIOLOGIC STUDY N/A 09/23/2015   Procedure: Atrial Fibrillation Ablation;  Surgeon: Thompson Grayer, MD;  Location: [place] CV LAB;  Service: Cardiovascular;  Laterality: N/A;  . LEFT HEART CATHETERIZATION WITH CORONARY ANGIOGRAM N/A 04/04/2012   Procedure: LEFT HEART CATHETERIZATION WITH CORONARY ANGIOGRAM;  Surgeon: Sinclair Grooms, MD;  Location: [facility] CATH LAB;  Service: Cardiovascular;  Laterality: N/A;  .  PERCUTANEOUS CORONARY INTERVENTION-BALLOON ONLY Right 04/04/2012   Procedure: PERCUTANEOUS CORONARY INTERVENTION-BALLOON ONLY;  Surgeon: Sinclair Grooms, MD;  Location: [facility] CATH LAB;  Service: Cardiovascular;  Laterality: Right;  . PERMANENT PACEMAKER INSERTION N/A 10/28/2011   Procedure: PERMANENT PACEMAKER INSERTION;  Surgeon: [name] Lance, MD;  Location: [facility] CATH LAB;  Service: Cardiovascular;  Laterality: N/A;  . TEE WITHOUT CARDIOVERSION  05/29/2012   Procedure: TRANSESOPHAGEAL ECHOCARDIOGRAM (TEE);  Surgeon: Sueanne Margarita, MD;  Location: [facility] ENDOSCOPY;  Service: Cardiovascular;  Laterality: N/A;  h/p in file drawer/dl     Current Meds  Medication Sig  . ELIQUIS 5 MG TABS tablet TAKE 1 TABLET BY MOUTH TWICE DAILY  . levothyroxine (SYNTHROID, LEVOTHROID) 100 MCG tablet Take 1 tablet (100 mcg total) by mouth daily before breakfast.  . lisinopril (PRINIVIL,ZESTRIL) 10 MG tablet Take 1 tablet (10 mg total) by mouth daily. Please call and schedule a one year follow up appointment with Dr Rayann Heman  . metoprolol succinate (TOPROL-XL) 50 MG 24 hr tablet TAKE 1 TABLET BY MOUTH DAILY WITH OR IMMEDIATELY FOLLOWING A MEAL  . rosuvastatin (CRESTOR) 40 MG tablet Take 1 tablet (40 mg total) by mouth daily.  . [DISCONTINUED] levothyroxine (SYNTHROID, LEVOTHROID) 100 MCG tablet Take 100 mcg by mouth daily before breakfast.   . [DISCONTINUED] lisinopril (PRINIVIL,ZESTRIL) 10 MG tablet Take 1 tablet (10 mg total) by mouth daily. Please call and schedule a one year follow up appointment with Dr Rayann Heman  . [DISCONTINUED] metoprolol succinate (TOPROL-XL) 50 MG 24 hr tablet TAKE 1 TABLET BY MOUTH DAILY WITH OR IMMEDIATELY FOLLOWING A MEAL  . [DISCONTINUED] rosuvastatin (CRESTOR) 40 MG tablet TAKE 1 TABLET BY MOUTH DAILY     Allergies:   Latex; Protonix [pantoprazole sodium]; Codeine; and Tape   Social History   Tobacco Use  . Smoking status: Former Smoker    Packs/day: 0.12    Years: 15.00    Pack years: 1.80     Types: Cigarettes    Last attempt to quit: 06/17/1977    Years since quitting: 41.4  . Smokeless tobacco: Never Used  . Tobacco comment: QUIT smoking cigarettes 1978  Substance Use Topics  . Alcohol use: No  . Drug use: No     Family Hx: The patient's family history includes Breast cancer in her mother; Heart attack (age of onset: 69) in her father; Heart attack (age of onset: 71) in her mother; Pancreatic cancer in her mother; Uterine cancer in her mother.  ROS:   Please see the history of present illness.    All other systems reviewed and are negative.   Labs/Other Tests and Data Reviewed:    Recent Labs: 03/03/2018: Hemoglobin 13.4; Platelets 305   Recent Lipid Panel Lab Results  Component Value Date/Time   CHOL 148 08/29/2017 08:39 AM   TRIG 97 08/29/2017 08:39 AM   HDL 64 08/29/2017 08:39 AM   CHOLHDL 2.3 08/29/2017 08:39 AM   CHOLHDL 2.0 05/21/2016 11:00 AM   LDLCALC 65 08/29/2017 08:39 AM    Wt Readings from Last 3 Encounters:  11/16/18 160 lb (72.6 kg)  03/03/18 176 lb 6.4 oz (80 kg)  01/25/18 180 lb (81.6 kg)     Objective:    Vital Signs:  BP (!) 150/87   Pulse 71   Ht 5\' 3"  (1.6 m)   Wt 160 lb (72.6 kg)   BMI 28.34 kg/m    CONSTITUTIONAL:  Well nourished, well developed female in no acute distress.  EYES: anicteric MOUTH: oral mucosa is pink RESPIRATORY: Normal respiratory effort, symmetric expansion CARDIOVASCULAR: No peripheral edema SKIN: No rash, lesions or ulcers MUSCULOSKELETAL: no digital cyanosis NEURO: Cranial Nerves II-XII grossly intact, moves all extremities PSYCH: Intact judgement and insight.  A&O x 3, Mood/affect appropriate   ASSESSMENT & PLAN:    1.  ASCAD - s/p cath in 2013 with  PCI of OM. She has not had any anginal sx since I saw her a year ago.  She will continue on BB and statin.  She is not on ASA due to DOAC.  2.  Persistent atrial fibrillation - she is s/p ablation x 2.  She thinks that she is in NSR.  She has not  had any palpitations.  She will continue on Toprol XL 50mg  daily and Eliquis 5mg  BID.    3.  Hyperlipidemia - her LDL goal is < 70.  She will continue on Crestor 40mg  daily.  I will repeat FLP and ALT in July once Covid 19 crisis has improved.  4.  Hypertension - she checks her BP at home and repeat after the first reading today was 114/98mmHg. She will continue on Lisinopril 10mg  daily and Toprol XL 50mg  daily.   5.  Tachy-Brady syndrome - she is s/p PPM which is followed in our device clinic.   6.  COVID-19 Education:The signs and symptoms of COVID-19 were discussed with the patient and how to seek care for testing (follow up with PCP or arrange E-visit).  The importance of social distancing was discussed today.  Patient Risk:   After full review of this patient's clinical status, I feel that they are at least moderate risk at this time.  Time:   Today, I have spent 20 minutes directly with the patient on video discussing medical problems including atrial fibrillation, HTN, CAD, hyperlipidemia.  We also reviewed the symptoms of COVID 19 and the ways to protect against contracting the virus with telehealth technology.  I spent an additional 10 minutes reviewing patient's chart including recent pacemaker check and labs..      Medication Adjustments/Labs and Tests Ordered: Current medicines are reviewed at length with the patient today.  Concerns regarding medicines are outlined above.  Tests Ordered: No orders of the defined types were placed in this encounter.  Medication Changes: Meds ordered this encounter  Medications  . levothyroxine (SYNTHROID, LEVOTHROID) 100 MCG tablet    Sig: Take 1 tablet (100 mcg total) by mouth daily before breakfast.    Dispense:  90 tablet    Refill:  3  . lisinopril (PRINIVIL,ZESTRIL) 10 MG tablet    Sig: Take 1 tablet (10 mg total) by mouth daily. Please call and schedule a one year follow up appointment with Dr Rayann Heman    Dispense:  90 tablet     Refill:  0  . metoprolol succinate (TOPROL-XL) 50 MG 24 hr tablet    Sig: TAKE 1 TABLET BY MOUTH DAILY WITH OR IMMEDIATELY FOLLOWING A MEAL    Dispense:  90 tablet    Refill:  3  . rosuvastatin (CRESTOR)  40 MG tablet    Sig: Take 1 tablet (40 mg total) by mouth daily.    Dispense:  90 tablet    Refill:  3    Disposition:  Follow up in 6 month(s)  Signed, Fransico Him, MD  11/16/2018 1:10 PM    [facility]

## 2018-11-16 ENCOUNTER — Other Ambulatory Visit: Payer: Self-pay

## 2018-11-16 ENCOUNTER — Telehealth (INDEPENDENT_AMBULATORY_CARE_PROVIDER_SITE_OTHER): Payer: Medicare HMO | Admitting: Cardiology

## 2018-11-16 ENCOUNTER — Encounter: Payer: Self-pay | Admitting: Cardiology

## 2018-11-16 VITALS — BP 150/87 | HR 71 | Ht 63.0 in | Wt 160.0 lb

## 2018-11-16 DIAGNOSIS — E669 Obesity, unspecified: Secondary | ICD-10-CM | POA: Diagnosis not present

## 2018-11-16 DIAGNOSIS — Z950 Presence of cardiac pacemaker: Secondary | ICD-10-CM

## 2018-11-16 DIAGNOSIS — I2510 Atherosclerotic heart disease of native coronary artery without angina pectoris: Secondary | ICD-10-CM

## 2018-11-16 DIAGNOSIS — I495 Sick sinus syndrome: Secondary | ICD-10-CM | POA: Diagnosis not present

## 2018-11-16 DIAGNOSIS — E7800 Pure hypercholesterolemia, unspecified: Secondary | ICD-10-CM | POA: Diagnosis not present

## 2018-11-16 DIAGNOSIS — I10 Essential (primary) hypertension: Secondary | ICD-10-CM | POA: Diagnosis not present

## 2018-11-16 DIAGNOSIS — Z7189 Other specified counseling: Secondary | ICD-10-CM

## 2018-11-16 DIAGNOSIS — I4819 Other persistent atrial fibrillation: Secondary | ICD-10-CM

## 2018-11-16 DIAGNOSIS — I119 Hypertensive heart disease without heart failure: Secondary | ICD-10-CM

## 2018-11-16 MED ORDER — LISINOPRIL 10 MG PO TABS: 10.0000 mg | ORAL_TABLET | Freq: Every day | ORAL | 0 refills | Status: DC

## 2018-11-16 MED ORDER — LEVOTHYROXINE SODIUM 100 MCG PO TABS: 100.0000 ug | ORAL_TABLET | Freq: Every day | ORAL | 3 refills | Status: DC

## 2018-11-16 MED ORDER — METOPROLOL SUCCINATE ER 50 MG PO TB24: ORAL_TABLET | ORAL | 3 refills | Status: DC

## 2018-11-16 MED ORDER — ROSUVASTATIN CALCIUM 40 MG PO TABS: 40.0000 mg | ORAL_TABLET | Freq: Every day | ORAL | 3 refills | Status: DC

## 2018-11-16 NOTE — Patient Instructions (Signed)
Medication Instructions:  Your physician recommends that you continue on your current medications as directed. Please refer to the Current Medication list given to you today.  If you need a refill on your cardiac medications before your next appointment, please call your pharmacy.   Lab work: Fasting Labs in July: Lipid and Liver If you have labs (blood work) drawn today and your tests are completely normal, you will receive your results only by: Marland Kitchen MyChart Message (if you have MyChart) OR . A paper copy in the mail If you have any lab test that is abnormal or we need to change your treatment, we will call you to review the results.  Testing/Procedures: None  Follow-Up: At [facility], you and your health needs are our priority.  As part of our continuing mission to provide you with exceptional heart care, we have created designated Provider Care Teams.  These Care Teams include your primary Cardiologist (physician) and Advanced Practice Providers (APPs -  Physician Assistants and Nurse Practitioners) who all work together to provide you with the care you need, when you need it. You will need a follow up appointment in 6 months.  Please call our office 2 months in advance to schedule this appointment.  You may see Dr. Radford Pax or one of the following Advanced Practice Providers on your designated Care Team:   [place], PA-C Melina Copa, PA-C . Ermalinda Barrios, PA-C

## 2018-12-04 DIAGNOSIS — E039 Hypothyroidism, unspecified: Secondary | ICD-10-CM | POA: Diagnosis not present

## 2018-12-04 DIAGNOSIS — I2510 Atherosclerotic heart disease of native coronary artery without angina pectoris: Secondary | ICD-10-CM | POA: Diagnosis not present

## 2018-12-04 DIAGNOSIS — I4891 Unspecified atrial fibrillation: Secondary | ICD-10-CM | POA: Diagnosis not present

## 2018-12-04 DIAGNOSIS — I10 Essential (primary) hypertension: Secondary | ICD-10-CM | POA: Diagnosis not present

## 2018-12-04 DIAGNOSIS — E785 Hyperlipidemia, unspecified: Secondary | ICD-10-CM | POA: Diagnosis not present

## 2019-01-13 ENCOUNTER — Other Ambulatory Visit: Payer: Self-pay | Admitting: Cardiology

## 2019-01-15 NOTE — Telephone Encounter (Signed)
Eliquis 5mg  refill request received; pt is 73 yrs old, wt-80kg, Crea-0.73 on 05/30/2018 via [facility] at [place], last virtual visit by Dr. Radford Pax on 11/16/2018; will send in refill.

## 2019-01-25 ENCOUNTER — Ambulatory Visit (INDEPENDENT_AMBULATORY_CARE_PROVIDER_SITE_OTHER): Payer: Medicare HMO | Admitting: *Deleted

## 2019-01-25 DIAGNOSIS — I495 Sick sinus syndrome: Secondary | ICD-10-CM | POA: Diagnosis not present

## 2019-01-25 LAB — CUP PACEART REMOTE DEVICE CHECK
Battery Remaining Longevity: 85 mo
Battery Remaining Percentage: 73 %
Battery Voltage: 2.92 V
Brady Statistic AP VP Percent: 1 %
Brady Statistic AP VS Percent: 30 %
Brady Statistic AS VP Percent: 1 %
Brady Statistic AS VS Percent: 70 %
Brady Statistic RA Percent Paced: 30 %
Brady Statistic RV Percent Paced: 1 %
Date Time Interrogation Session: 20200624072309
Implantable Lead Implant Date: 20130328
Implantable Lead Implant Date: 20130328
Implantable Lead Location: 753859
Implantable Lead Location: 753860
Implantable Pulse Generator Implant Date: 20130328
Lead Channel Impedance Value: 360 Ohm
Lead Channel Impedance Value: 380 Ohm
Lead Channel Pacing Threshold Amplitude: 0.625 V
Lead Channel Pacing Threshold Amplitude: 1.25 V
Lead Channel Pacing Threshold Pulse Width: 0.5 ms
Lead Channel Pacing Threshold Pulse Width: 0.7 ms
Lead Channel Sensing Intrinsic Amplitude: 3.7 mV
Lead Channel Sensing Intrinsic Amplitude: 3.9 mV
Lead Channel Setting Pacing Amplitude: 1.625
Lead Channel Setting Pacing Amplitude: 2.5 V
Lead Channel Setting Pacing Pulse Width: 0.7 ms
Lead Channel Setting Sensing Sensitivity: 2 mV
Pulse Gen Model: 2210
Pulse Gen Serial Number: 7334985

## 2019-02-05 ENCOUNTER — Encounter: Payer: Self-pay | Admitting: Cardiology

## 2019-02-05 NOTE — Progress Notes (Signed)
Remote pacemaker transmission.   

## 2019-02-07 DIAGNOSIS — I4891 Unspecified atrial fibrillation: Secondary | ICD-10-CM | POA: Diagnosis not present

## 2019-02-07 DIAGNOSIS — I10 Essential (primary) hypertension: Secondary | ICD-10-CM | POA: Diagnosis not present

## 2019-02-07 DIAGNOSIS — E039 Hypothyroidism, unspecified: Secondary | ICD-10-CM | POA: Diagnosis not present

## 2019-02-07 DIAGNOSIS — I2510 Atherosclerotic heart disease of native coronary artery without angina pectoris: Secondary | ICD-10-CM | POA: Diagnosis not present

## 2019-02-07 DIAGNOSIS — E785 Hyperlipidemia, unspecified: Secondary | ICD-10-CM | POA: Diagnosis not present

## 2019-02-13 ENCOUNTER — Other Ambulatory Visit: Payer: Self-pay | Admitting: Cardiology

## 2019-02-13 DIAGNOSIS — I119 Hypertensive heart disease without heart failure: Secondary | ICD-10-CM

## 2019-02-26 DIAGNOSIS — Z20828 Contact with and (suspected) exposure to other viral communicable diseases: Secondary | ICD-10-CM | POA: Diagnosis not present

## 2019-04-18 DIAGNOSIS — I2510 Atherosclerotic heart disease of native coronary artery without angina pectoris: Secondary | ICD-10-CM | POA: Diagnosis not present

## 2019-04-18 DIAGNOSIS — E039 Hypothyroidism, unspecified: Secondary | ICD-10-CM | POA: Diagnosis not present

## 2019-04-18 DIAGNOSIS — I4891 Unspecified atrial fibrillation: Secondary | ICD-10-CM | POA: Diagnosis not present

## 2019-04-18 DIAGNOSIS — I10 Essential (primary) hypertension: Secondary | ICD-10-CM | POA: Diagnosis not present

## 2019-04-18 DIAGNOSIS — E785 Hyperlipidemia, unspecified: Secondary | ICD-10-CM | POA: Diagnosis not present

## 2019-04-26 ENCOUNTER — Ambulatory Visit (INDEPENDENT_AMBULATORY_CARE_PROVIDER_SITE_OTHER): Payer: Medicare HMO | Admitting: *Deleted

## 2019-04-26 DIAGNOSIS — I495 Sick sinus syndrome: Secondary | ICD-10-CM | POA: Diagnosis not present

## 2019-04-26 LAB — CUP PACEART REMOTE DEVICE CHECK
Battery Remaining Longevity: 76 mo
Battery Remaining Percentage: 65 %
Battery Voltage: 2.9 V
Brady Statistic AP VP Percent: 1 %
Brady Statistic AP VS Percent: 34 %
Brady Statistic AS VP Percent: 1 %
Brady Statistic AS VS Percent: 66 %
Brady Statistic RA Percent Paced: 34 %
Brady Statistic RV Percent Paced: 1 %
Date Time Interrogation Session: 20200923074956
Implantable Lead Implant Date: 20130328
Implantable Lead Implant Date: 20130328
Implantable Lead Location: 753859
Implantable Lead Location: 753860
Implantable Pulse Generator Implant Date: 20130328
Lead Channel Impedance Value: 380 Ohm
Lead Channel Impedance Value: 400 Ohm
Lead Channel Pacing Threshold Amplitude: 0.625 V
Lead Channel Pacing Threshold Amplitude: 1.25 V
Lead Channel Pacing Threshold Pulse Width: 0.5 ms
Lead Channel Pacing Threshold Pulse Width: 0.7 ms
Lead Channel Sensing Intrinsic Amplitude: 3.6 mV
Lead Channel Sensing Intrinsic Amplitude: 4.9 mV
Lead Channel Setting Pacing Amplitude: 1.625
Lead Channel Setting Pacing Amplitude: 2.5 V
Lead Channel Setting Pacing Pulse Width: 0.7 ms
Lead Channel Setting Sensing Sensitivity: 2 mV
Pulse Gen Model: 2210
Pulse Gen Serial Number: 7334985

## 2019-04-27 DIAGNOSIS — R05 Cough: Secondary | ICD-10-CM | POA: Diagnosis not present

## 2019-05-04 ENCOUNTER — Encounter: Payer: Self-pay | Admitting: Cardiology

## 2019-05-04 NOTE — Progress Notes (Signed)
Remote pacemaker transmission.   

## 2019-05-15 ENCOUNTER — Other Ambulatory Visit: Payer: Self-pay | Admitting: Cardiology

## 2019-05-15 DIAGNOSIS — I119 Hypertensive heart disease without heart failure: Secondary | ICD-10-CM

## 2019-07-14 ENCOUNTER — Other Ambulatory Visit: Payer: Self-pay | Admitting: Cardiology

## 2019-07-16 NOTE — Telephone Encounter (Signed)
Prescription refill request for Eliquis received.  Last office visit: 11/16/2018, [place] Scr: 0.75, 02/07/2019 Age: 73 y.o. Weight: 72.6 kg   Prescription refill sent.

## 2019-07-20 DIAGNOSIS — E785 Hyperlipidemia, unspecified: Secondary | ICD-10-CM | POA: Diagnosis not present

## 2019-07-20 DIAGNOSIS — E7800 Pure hypercholesterolemia, unspecified: Secondary | ICD-10-CM | POA: Diagnosis not present

## 2019-07-20 DIAGNOSIS — I4891 Unspecified atrial fibrillation: Secondary | ICD-10-CM | POA: Diagnosis not present

## 2019-07-20 DIAGNOSIS — I10 Essential (primary) hypertension: Secondary | ICD-10-CM | POA: Diagnosis not present

## 2019-07-20 DIAGNOSIS — I2510 Atherosclerotic heart disease of native coronary artery without angina pectoris: Secondary | ICD-10-CM | POA: Diagnosis not present

## 2019-07-20 DIAGNOSIS — E039 Hypothyroidism, unspecified: Secondary | ICD-10-CM | POA: Diagnosis not present

## 2019-07-26 ENCOUNTER — Ambulatory Visit (INDEPENDENT_AMBULATORY_CARE_PROVIDER_SITE_OTHER): Payer: Medicare HMO | Admitting: *Deleted

## 2019-07-26 DIAGNOSIS — I495 Sick sinus syndrome: Secondary | ICD-10-CM | POA: Diagnosis not present

## 2019-07-26 LAB — CUP PACEART REMOTE DEVICE CHECK
Battery Remaining Longevity: 75 mo
Battery Remaining Percentage: 65 %
Battery Voltage: 2.9 V
Brady Statistic AP VP Percent: 1 %
Brady Statistic AP VS Percent: 37 %
Brady Statistic AS VP Percent: 1 %
Brady Statistic AS VS Percent: 62 %
Brady Statistic RA Percent Paced: 37 %
Brady Statistic RV Percent Paced: 1 %
Date Time Interrogation Session: 20201223032536
Implantable Lead Implant Date: 20130328
Implantable Lead Implant Date: 20130328
Implantable Lead Location: 753859
Implantable Lead Location: 753860
Implantable Pulse Generator Implant Date: 20130328
Lead Channel Impedance Value: 380 Ohm
Lead Channel Impedance Value: 410 Ohm
Lead Channel Pacing Threshold Amplitude: 0.75 V
Lead Channel Pacing Threshold Amplitude: 1.25 V
Lead Channel Pacing Threshold Pulse Width: 0.5 ms
Lead Channel Pacing Threshold Pulse Width: 0.7 ms
Lead Channel Sensing Intrinsic Amplitude: 3.2 mV
Lead Channel Sensing Intrinsic Amplitude: 4.5 mV
Lead Channel Setting Pacing Amplitude: 1.75 V
Lead Channel Setting Pacing Amplitude: 2.5 V
Lead Channel Setting Pacing Pulse Width: 0.7 ms
Lead Channel Setting Sensing Sensitivity: 2 mV
Pulse Gen Model: 2210
Pulse Gen Serial Number: 7334985

## 2019-07-29 NOTE — Progress Notes (Signed)
PPM remote 

## 2019-08-24 DIAGNOSIS — Z20828 Contact with and (suspected) exposure to other viral communicable diseases: Secondary | ICD-10-CM | POA: Diagnosis not present

## 2019-08-24 DIAGNOSIS — R519 Headache, unspecified: Secondary | ICD-10-CM | POA: Diagnosis not present

## 2019-08-24 DIAGNOSIS — R509 Fever, unspecified: Secondary | ICD-10-CM | POA: Diagnosis not present

## 2019-08-30 DIAGNOSIS — I2510 Atherosclerotic heart disease of native coronary artery without angina pectoris: Secondary | ICD-10-CM | POA: Diagnosis not present

## 2019-08-30 DIAGNOSIS — E039 Hypothyroidism, unspecified: Secondary | ICD-10-CM | POA: Diagnosis not present

## 2019-08-30 DIAGNOSIS — I10 Essential (primary) hypertension: Secondary | ICD-10-CM | POA: Diagnosis not present

## 2019-08-30 DIAGNOSIS — E7800 Pure hypercholesterolemia, unspecified: Secondary | ICD-10-CM | POA: Diagnosis not present

## 2019-08-30 DIAGNOSIS — E785 Hyperlipidemia, unspecified: Secondary | ICD-10-CM | POA: Diagnosis not present

## 2019-08-30 DIAGNOSIS — I4891 Unspecified atrial fibrillation: Secondary | ICD-10-CM | POA: Diagnosis not present

## 2019-09-17 DIAGNOSIS — E785 Hyperlipidemia, unspecified: Secondary | ICD-10-CM | POA: Diagnosis not present

## 2019-09-17 DIAGNOSIS — E7800 Pure hypercholesterolemia, unspecified: Secondary | ICD-10-CM | POA: Diagnosis not present

## 2019-09-17 DIAGNOSIS — I4891 Unspecified atrial fibrillation: Secondary | ICD-10-CM | POA: Diagnosis not present

## 2019-09-17 DIAGNOSIS — I10 Essential (primary) hypertension: Secondary | ICD-10-CM | POA: Diagnosis not present

## 2019-09-17 DIAGNOSIS — I2510 Atherosclerotic heart disease of native coronary artery without angina pectoris: Secondary | ICD-10-CM | POA: Diagnosis not present

## 2019-09-17 DIAGNOSIS — E039 Hypothyroidism, unspecified: Secondary | ICD-10-CM | POA: Diagnosis not present

## 2019-10-25 ENCOUNTER — Ambulatory Visit (INDEPENDENT_AMBULATORY_CARE_PROVIDER_SITE_OTHER): Payer: Medicare HMO | Admitting: *Deleted

## 2019-10-25 DIAGNOSIS — I495 Sick sinus syndrome: Secondary | ICD-10-CM | POA: Diagnosis not present

## 2019-10-25 LAB — CUP PACEART REMOTE DEVICE CHECK
Battery Remaining Longevity: 59 mo
Battery Remaining Percentage: 51 %
Battery Voltage: 2.87 V
Brady Statistic AP VP Percent: 1 %
Brady Statistic AP VS Percent: 43 %
Brady Statistic AS VP Percent: 1 %
Brady Statistic AS VS Percent: 57 %
Brady Statistic RA Percent Paced: 43 %
Brady Statistic RV Percent Paced: 1 %
Date Time Interrogation Session: 20210325025425
Implantable Lead Implant Date: 20130328
Implantable Lead Implant Date: 20130328
Implantable Lead Location: 753859
Implantable Lead Location: 753860
Implantable Pulse Generator Implant Date: 20130328
Lead Channel Impedance Value: 380 Ohm
Lead Channel Impedance Value: 400 Ohm
Lead Channel Pacing Threshold Amplitude: 0.625 V
Lead Channel Pacing Threshold Amplitude: 1.25 V
Lead Channel Pacing Threshold Pulse Width: 0.5 ms
Lead Channel Pacing Threshold Pulse Width: 0.7 ms
Lead Channel Sensing Intrinsic Amplitude: 2.9 mV
Lead Channel Sensing Intrinsic Amplitude: 4.3 mV
Lead Channel Setting Pacing Amplitude: 1.625
Lead Channel Setting Pacing Amplitude: 2.5 V
Lead Channel Setting Pacing Pulse Width: 0.7 ms
Lead Channel Setting Sensing Sensitivity: 2 mV
Pulse Gen Model: 2210
Pulse Gen Serial Number: 7334985

## 2019-10-26 NOTE — Progress Notes (Signed)
PPM Remote  

## 2019-11-06 ENCOUNTER — Other Ambulatory Visit: Payer: Self-pay | Admitting: Cardiology

## 2019-11-06 DIAGNOSIS — I119 Hypertensive heart disease without heart failure: Secondary | ICD-10-CM

## 2019-11-16 DIAGNOSIS — E039 Hypothyroidism, unspecified: Secondary | ICD-10-CM | POA: Diagnosis not present

## 2019-11-16 DIAGNOSIS — E7800 Pure hypercholesterolemia, unspecified: Secondary | ICD-10-CM | POA: Diagnosis not present

## 2019-11-16 DIAGNOSIS — E785 Hyperlipidemia, unspecified: Secondary | ICD-10-CM | POA: Diagnosis not present

## 2019-11-16 DIAGNOSIS — I10 Essential (primary) hypertension: Secondary | ICD-10-CM | POA: Diagnosis not present

## 2019-11-16 DIAGNOSIS — I2510 Atherosclerotic heart disease of native coronary artery without angina pectoris: Secondary | ICD-10-CM | POA: Diagnosis not present

## 2019-11-16 DIAGNOSIS — I4891 Unspecified atrial fibrillation: Secondary | ICD-10-CM | POA: Diagnosis not present

## 2019-12-11 ENCOUNTER — Telehealth: Payer: Self-pay | Admitting: Cardiology

## 2019-12-11 ENCOUNTER — Other Ambulatory Visit: Payer: Self-pay | Admitting: Cardiology

## 2019-12-11 DIAGNOSIS — I119 Hypertensive heart disease without heart failure: Secondary | ICD-10-CM

## 2019-12-11 MED ORDER — LISINOPRIL 10 MG PO TABS: 10.0000 mg | ORAL_TABLET | Freq: Every day | ORAL | 0 refills | Status: DC

## 2019-12-11 NOTE — Telephone Encounter (Signed)
Pt's medication was sent to pt's pharmacy as requested. Confirmation received.  °

## 2019-12-11 NOTE — Telephone Encounter (Signed)
*  STAT* If patient is at the pharmacy, call can be transferred to refill team.   1. Which medications need to be refilled? (please list name of each medication and dose if known)    lisinopril (ZESTRIL) 10 MG tablet    2. Which pharmacy/location (including street and city if local pharmacy) is medication to be sent to?  [facility] [place] [address].  3. Do they need a 30 day or 90 day supply? 30 days   Patient is out of medication. She is schedule to see Dr. Radford Pax on 12/17/19.

## 2019-12-17 ENCOUNTER — Ambulatory Visit: Payer: Medicare HMO | Admitting: Cardiology

## 2019-12-17 ENCOUNTER — Encounter: Payer: Self-pay | Admitting: Cardiology

## 2019-12-17 ENCOUNTER — Other Ambulatory Visit: Payer: Self-pay

## 2019-12-17 VITALS — BP 132/68 | HR 61 | Ht 63.0 in | Wt 162.8 lb

## 2019-12-17 DIAGNOSIS — I10 Essential (primary) hypertension: Secondary | ICD-10-CM

## 2019-12-17 DIAGNOSIS — E7800 Pure hypercholesterolemia, unspecified: Secondary | ICD-10-CM

## 2019-12-17 DIAGNOSIS — I495 Sick sinus syndrome: Secondary | ICD-10-CM

## 2019-12-17 DIAGNOSIS — I2510 Atherosclerotic heart disease of native coronary artery without angina pectoris: Secondary | ICD-10-CM

## 2019-12-17 DIAGNOSIS — I4819 Other persistent atrial fibrillation: Secondary | ICD-10-CM

## 2019-12-17 LAB — LIPID PANEL
Chol/HDL Ratio: 2.4 ratio (ref 0.0–4.4)
Cholesterol, Total: 134 mg/dL (ref 100–199)
HDL: 57 mg/dL (ref >39)
LDL Chol Calc (NIH): 60 mg/dL (ref 0–99)
Triglycerides: 87 mg/dL (ref 0–149)
VLDL Cholesterol Cal: 17 mg/dL (ref 5–40)

## 2019-12-17 LAB — COMPREHENSIVE METABOLIC PANEL
ALT: 8 IU/L (ref 0–32)
AST: 15 IU/L (ref 0–40)
Albumin/Globulin Ratio: 1.7 (ref 1.2–2.2)
Albumin: 4.2 g/dL (ref 3.7–4.7)
Alkaline Phosphatase: 23 IU/L — ABNORMAL LOW (ref 48–121)
BUN/Creatinine Ratio: 13 (ref 12–28)
BUN: 11 mg/dL (ref 8–27)
Bilirubin Total: 0.4 mg/dL (ref 0.0–1.2)
CO2: 23 mmol/L (ref 20–29)
Calcium: 9.2 mg/dL (ref 8.7–10.3)
Chloride: 97 mmol/L (ref 96–106)
Creatinine, Ser: 0.86 mg/dL (ref 0.57–1.00)
GFR calc Af Amer: 78 mL/min/{1.73_m2} (ref >59)
GFR calc non Af Amer: 68 mL/min/{1.73_m2} (ref >59)
Globulin, Total: 2.5 g/dL (ref 1.5–4.5)
Glucose: 91 mg/dL (ref 65–99)
Potassium: 3.7 mmol/L (ref 3.5–5.2)
Sodium: 141 mmol/L (ref 134–144)
Total Protein: 6.7 g/dL (ref 6.0–8.5)

## 2019-12-17 LAB — CBC
Hematocrit: 41.1 % (ref 34.0–46.6)
Hemoglobin: 13.4 g/dL (ref 11.1–15.9)
MCH: 27.6 pg (ref 26.6–33.0)
MCHC: 32.6 g/dL (ref 31.5–35.7)
MCV: 85 fL (ref 79–97)
Platelets: 287 10*3/uL (ref 150–450)
RBC: 4.86 x10E6/uL (ref 3.77–5.28)
RDW: 13.1 % (ref 11.7–15.4)
WBC: 6.6 10*3/uL (ref 3.4–10.8)

## 2019-12-17 NOTE — Addendum Note (Signed)
Addended by: Antonieta Iba on: 12/17/2019 11:59 AM   Modules accepted: Orders

## 2019-12-17 NOTE — Patient Instructions (Signed)
Medication Instructions:  Your physician recommends that you continue on your current medications as directed. Please refer to the Current Medication list given to you today.  *If you need a refill on your cardiac medications before your next appointment, please call your pharmacy*  Lab Work: TODAY: Fasting lipids, CMET, CBC If you have labs (blood work) drawn today and your tests are completely normal, you will receive your results only by: Marland Kitchen MyChart Message (if you have MyChart) OR . A paper copy in the mail If you have any lab test that is abnormal or we need to change your treatment, we will call you to review the results.  Follow-Up: At [facility], you and your health needs are our priority.  As part of our continuing mission to provide you with exceptional heart care, we have created designated Provider Care Teams.  These Care Teams include your primary Cardiologist (physician) and Advanced Practice Providers (APPs -  Physician Assistants and Nurse Practitioners) who all work together to provide you with the care you need, when you need it.  Your next appointment:   1 year(s)  The format for your next appointment:   In Person  Provider:   You may see Fransico Him, MD or one of the following Advanced Practice Providers on your designated Care Team:    Melina Copa, PA-C  Ermalinda Barrios, PA-C

## 2019-12-17 NOTE — Progress Notes (Signed)
Cardiology Office Note:    Date:  12/17/2019   ID:  KIYOMI MATTY, DOB 12/19/1945, MRN [id number]  PCP:  Leighton Ruff, MD  Cardiologist:  No primary care provider on file.    Referring MD: Leighton Ruff, MD   Chief Complaint  Patient presents with  . Coronary Artery Disease  . Atrial Flutter  . Hyperlipidemia    History of Present Illness:    TANAYA GOGAN is a 74 y.o. female with a hx of PAF/flutter s/p ablationx 2, tachy/brady syndrome s/p PPM, HTN, ASCADs/p PCI of OMand dyslipidemia.  She is here today for followup and is doing well.  She denies any chest pain or pressure, SOB, DOE, PND, orthopnea, LE edema, dizziness, palpitations or syncope. She is compliant with her meds and is tolerating meds with no SE.    Past Medical History:  Diagnosis Date  . Anemia   . Arthritis   . Blood transfusion ~ 1962   " couple; no reaction " (05/30/2012)  . Coronary artery disease 2013   PCI PCI of OM  . Depression   . GERD (gastroesophageal reflux disease)   . H/O hiatal hernia   . HTN (hypertension)   . Hypercholesteremia   . Hypothyroidism   . Myocardial infarction [facility])    "very very light" (05/30/2012)  . PAF (paroxysmal atrial fibrillation) ([place]) 05/30/2012   s/p afib/flutter ablation 2013/2017  . Tachy-brady syndrome ([place]) 10/2011   a. post-termination pauses in setting of PAF;  b. 10/28/11 - St. Jude Dual Chamber PPM SN [id number]   . Ureteral obstruction, right     Past Surgical History:  Procedure Laterality Date  . ATRIAL FIBRILLATION ABLATION N/A 05/30/2012   PVI and CTi ablation by Dr Rayann Heman  . BREAST SURGERY  1990's   "right radial scar" (05/30/2012)  . CORONARY ANGIOPLASTY  ~ 04/2012  . CYSTOSCOPY WITH RETROGRADE PYELOGRAM, URETEROSCOPY AND STENT PLACEMENT  08/21/2012   Procedure: CYSTOSCOPY WITH RETROGRADE PYELOGRAM, URETEROSCOPY AND STENT PLACEMENT;  Surgeon: Franchot Gallo, MD;  Location: [facility];  Service: Urology;  Laterality: Right;  URETEROSCOPY  WITH BIOPSY   . ELECTROPHYSIOLOGIC STUDY N/A 09/23/2015   Procedure: Atrial Fibrillation Ablation;  Surgeon: Thompson Grayer, MD;  Location: [place] CV LAB;  Service: Cardiovascular;  Laterality: N/A;  . LEFT HEART CATHETERIZATION WITH CORONARY ANGIOGRAM N/A 04/04/2012   Procedure: LEFT HEART CATHETERIZATION WITH CORONARY ANGIOGRAM;  Surgeon: Sinclair Grooms, MD;  Location: [facility] CATH LAB;  Service: Cardiovascular;  Laterality: N/A;  . PERCUTANEOUS CORONARY INTERVENTION-BALLOON ONLY Right 04/04/2012   Procedure: PERCUTANEOUS CORONARY INTERVENTION-BALLOON ONLY;  Surgeon: Sinclair Grooms, MD;  Location: [facility] CATH LAB;  Service: Cardiovascular;  Laterality: Right;  . PERMANENT PACEMAKER INSERTION N/A 10/28/2011   Procedure: PERMANENT PACEMAKER INSERTION;  Surgeon: [name] Lance, MD;  Location: [facility] CATH LAB;  Service: Cardiovascular;  Laterality: N/A;  . TEE WITHOUT CARDIOVERSION  05/29/2012   Procedure: TRANSESOPHAGEAL ECHOCARDIOGRAM (TEE);  Surgeon: Sueanne Margarita, MD;  Location: [facility] ENDOSCOPY;  Service: Cardiovascular;  Laterality: N/A;  h/p in file drawer/dl    Current Medications: Current Meds  Medication Sig  . ELIQUIS 5 MG TABS tablet TAKE 1 TABLET BY MOUTH TWICE DAILY  . levothyroxine (SYNTHROID, LEVOTHROID) 100 MCG tablet Take 1 tablet (100 mcg total) by mouth daily before breakfast.  . lisinopril (ZESTRIL) 10 MG tablet Take 1 tablet (10 mg total) by mouth daily. Please keep upcoming appt in May with Dr. Radford Pax before anymore refills. Thank you  .  metoprolol succinate (TOPROL-XL) 50 MG 24 hr tablet TAKE 1 TABLET BY MOUTH DAILY WITH OR IMMEDIATELY FOLLOWING A MEAL  . rosuvastatin (CRESTOR) 40 MG tablet Take 1 tablet (40 mg total) by mouth daily.     Allergies:   Latex, Protonix [pantoprazole sodium], Codeine, and Tape   Social History   Socioeconomic History  . Marital status: Married    Spouse name: Not on file  . Number of children: 4  . Years of education: Not on file  . Highest  education level: Not on file  Occupational History  . Not on file  Tobacco Use  . Smoking status: Former Smoker    Packs/day: 0.12    Years: 15.00    Pack years: 1.80    Types: Cigarettes    Quit date: 06/17/1977    Years since quitting: 42.5  . Smokeless tobacco: Never Used  . Tobacco comment: QUIT smoking cigarettes 1978  Substance and Sexual Activity  . Alcohol use: No  . Drug use: No  . Sexual activity: Never    Birth control/protection: Post-menopausal  Other Topics Concern  . Not on file  Social History Narrative   Lives with husband.  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   Social Determinants of Health   Financial Resource Strain:   . Difficulty of Paying Living Expenses:   Food Insecurity:   . Worried About Charity fundraiser in the Last Year:   . Arboriculturist in the Last Year:   Transportation Needs:   . Film/video editor (Medical):   Marland Kitchen Lack of Transportation (Non-Medical):   Physical Activity:   . Days of Exercise per Week:   . Minutes of Exercise per Session:   Stress:   . Feeling of Stress :   Social Connections:   . Frequency of Communication with Friends and Family:   . Frequency of Social Gatherings with Friends and Family:   . Attends Religious Services:   . Active Member of Clubs or Organizations:   . Attends Archivist Meetings:   Marland Kitchen Marital Status:      Family History: The patient's family history includes Breast cancer in her mother; Heart attack (age of onset: 63) in her father; Heart attack (age of onset: 49) in her mother; Pancreatic cancer in her mother; Uterine cancer in her mother.  ROS:   Please see the history of present illness.    ROS  All other systems reviewed and negative.   EKGs/Labs/Other Studies Reviewed:    The following studies were reviewed today: PAP compliance download  EKG:  EKG is ordered today and showed atrial paced rhythm with no ST changes  Recent Labs: No results found for requested labs  within last 8760 hours.   Recent Lipid Panel    Component Value Date/Time   CHOL 148 08/29/2017 0839   TRIG 97 08/29/2017 0839   HDL 64 08/29/2017 0839   CHOLHDL 2.3 08/29/2017 0839   CHOLHDL 2.0 05/21/2016 1100   VLDL 12 05/21/2016 1100   LDLCALC 65 08/29/2017 0839    Physical Exam:    VS:  BP (!) 312/68   Pulse 61   Ht 5\' 3"  (1.6 m)   Wt 162 lb 12.8 oz (73.8 kg)   SpO2 97%   BMI 28.84 kg/m     Wt Readings from Last 3 Encounters:  12/17/19 162 lb 12.8 oz (73.8 kg)  11/16/18 160 lb (72.6 kg)  03/03/18 176 lb 6.4 oz (80 kg)  GEN:  Well nourished, well developed in no acute distress HEENT: Normal NECK: No JVD; No carotid bruits LYMPHATICS: No lymphadenopathy CARDIAC: RRR, no murmurs, rubs, gallops RESPIRATORY:  Clear to auscultation without rales, wheezing or rhonchi  ABDOMEN: Soft, non-tender, non-distended MUSCULOSKELETAL:  No edema; No deformity  SKIN: Warm and dry NEUROLOGIC:  Alert and oriented x 3 PSYCHIATRIC:  Normal affect   ASSESSMENT:    1. Coronary artery disease involving native coronary artery of native heart without angina pectoris   2. Persistent atrial fibrillation ([place])   3. Hypercholesteremia   4. Essential hypertension   5. Tachy-brady syndrome ([place])    PLAN:    In order of problems listed above:  1.  ASCAD  - s/p cath in 2013 with  PCI of OM.  -she denies any anginal sx -continue BB and statin -no ASA due to DOAC  2.  Persistent atrial fibrillation  -s/p ablation x 2 -denies any palptiations -continue Toprol XL 50mg  daily and Eliquis 5mg  BID -denies any bleeding problems -Check BMET and CBC  3.  Hyperlipidemia  - her LDL goal is < 70.   -LDL was 69 last July -continue Crestory 40mg  daily -repeat FLP and ALT  4.  Hypertension  -BP controlled on exam -continue Lisinopril 10mg  daily and Toprol XL 50mg  daily  5.  Tachy-Brady syndrome  - she is s/p PPM which is followed in our device clinic.    Medication  Adjustments/Labs and Tests Ordered: Current medicines are reviewed at length with the patient today.  Concerns regarding medicines are outlined above.  Orders Placed This Encounter  Procedures  . EKG 12-Lead   No orders of the defined types were placed in this encounter.   Signed, Fransico Him, MD  12/17/2019 11:53 AM    [place]

## 2019-12-24 ENCOUNTER — Other Ambulatory Visit: Payer: Self-pay | Admitting: Cardiology

## 2019-12-26 NOTE — Telephone Encounter (Signed)
Pt's pharmacy is requesting a refill on levothyroxine. Would Dr. Radford Pax like to refill this medication? Please address

## 2020-01-01 ENCOUNTER — Other Ambulatory Visit: Payer: Self-pay | Admitting: Cardiology

## 2020-01-07 ENCOUNTER — Other Ambulatory Visit: Payer: Self-pay | Admitting: Cardiology

## 2020-01-07 DIAGNOSIS — I119 Hypertensive heart disease without heart failure: Secondary | ICD-10-CM

## 2020-01-08 NOTE — Telephone Encounter (Signed)
Pt last saw Dr Radford Pax 12/17/19, last labs 12/17/19 Creat 0.86, age 74, weight 73.8kg, based on specified criteria pt is on appropriate dosage of Eliquis 5mg  BID.  Will refill rx.

## 2020-01-14 DIAGNOSIS — E785 Hyperlipidemia, unspecified: Secondary | ICD-10-CM | POA: Diagnosis not present

## 2020-01-14 DIAGNOSIS — E7800 Pure hypercholesterolemia, unspecified: Secondary | ICD-10-CM | POA: Diagnosis not present

## 2020-01-14 DIAGNOSIS — I2510 Atherosclerotic heart disease of native coronary artery without angina pectoris: Secondary | ICD-10-CM | POA: Diagnosis not present

## 2020-01-14 DIAGNOSIS — I4891 Unspecified atrial fibrillation: Secondary | ICD-10-CM | POA: Diagnosis not present

## 2020-01-14 DIAGNOSIS — I10 Essential (primary) hypertension: Secondary | ICD-10-CM | POA: Diagnosis not present

## 2020-01-14 DIAGNOSIS — E039 Hypothyroidism, unspecified: Secondary | ICD-10-CM | POA: Diagnosis not present

## 2020-01-24 ENCOUNTER — Ambulatory Visit (INDEPENDENT_AMBULATORY_CARE_PROVIDER_SITE_OTHER): Payer: Medicare HMO | Admitting: *Deleted

## 2020-01-24 DIAGNOSIS — I495 Sick sinus syndrome: Secondary | ICD-10-CM

## 2020-01-24 DIAGNOSIS — I4819 Other persistent atrial fibrillation: Secondary | ICD-10-CM

## 2020-01-24 LAB — CUP PACEART REMOTE DEVICE CHECK
Battery Remaining Longevity: 59 mo
Battery Remaining Percentage: 51 %
Battery Voltage: 2.87 V
Brady Statistic AP VP Percent: 1 %
Brady Statistic AP VS Percent: 48 %
Brady Statistic AS VP Percent: 1 %
Brady Statistic AS VS Percent: 52 %
Brady Statistic RA Percent Paced: 48 %
Brady Statistic RV Percent Paced: 1 %
Date Time Interrogation Session: 20210624032836
Implantable Lead Implant Date: 20130328
Implantable Lead Implant Date: 20130328
Implantable Lead Location: 753859
Implantable Lead Location: 753860
Implantable Pulse Generator Implant Date: 20130328
Lead Channel Impedance Value: 380 Ohm
Lead Channel Impedance Value: 400 Ohm
Lead Channel Pacing Threshold Amplitude: 0.75 V
Lead Channel Pacing Threshold Amplitude: 1.25 V
Lead Channel Pacing Threshold Pulse Width: 0.5 ms
Lead Channel Pacing Threshold Pulse Width: 0.7 ms
Lead Channel Sensing Intrinsic Amplitude: 2.7 mV
Lead Channel Sensing Intrinsic Amplitude: 4 mV
Lead Channel Setting Pacing Amplitude: 1.75 V
Lead Channel Setting Pacing Amplitude: 2.5 V
Lead Channel Setting Pacing Pulse Width: 0.7 ms
Lead Channel Setting Sensing Sensitivity: 2 mV
Pulse Gen Model: 2210
Pulse Gen Serial Number: 7334985

## 2020-01-24 NOTE — Progress Notes (Signed)
Remote pacemaker transmission.   

## 2020-02-05 ENCOUNTER — Other Ambulatory Visit: Payer: Self-pay | Admitting: Cardiology

## 2020-03-07 DIAGNOSIS — E785 Hyperlipidemia, unspecified: Secondary | ICD-10-CM | POA: Diagnosis not present

## 2020-03-07 DIAGNOSIS — I10 Essential (primary) hypertension: Secondary | ICD-10-CM | POA: Diagnosis not present

## 2020-03-07 DIAGNOSIS — E7800 Pure hypercholesterolemia, unspecified: Secondary | ICD-10-CM | POA: Diagnosis not present

## 2020-03-07 DIAGNOSIS — I2510 Atherosclerotic heart disease of native coronary artery without angina pectoris: Secondary | ICD-10-CM | POA: Diagnosis not present

## 2020-03-07 DIAGNOSIS — E039 Hypothyroidism, unspecified: Secondary | ICD-10-CM | POA: Diagnosis not present

## 2020-03-07 DIAGNOSIS — I4891 Unspecified atrial fibrillation: Secondary | ICD-10-CM | POA: Diagnosis not present

## 2020-05-02 ENCOUNTER — Ambulatory Visit (INDEPENDENT_AMBULATORY_CARE_PROVIDER_SITE_OTHER): Payer: Medicare HMO

## 2020-05-02 DIAGNOSIS — I495 Sick sinus syndrome: Secondary | ICD-10-CM

## 2020-05-02 DIAGNOSIS — I4819 Other persistent atrial fibrillation: Secondary | ICD-10-CM

## 2020-05-04 LAB — CUP PACEART REMOTE DEVICE CHECK
Battery Remaining Longevity: 45 mo
Battery Remaining Percentage: 40 %
Battery Voltage: 2.84 V
Brady Statistic AP VP Percent: 1 %
Brady Statistic AP VS Percent: 52 %
Brady Statistic AS VP Percent: 1 %
Brady Statistic AS VS Percent: 48 %
Brady Statistic RA Percent Paced: 52 %
Brady Statistic RV Percent Paced: 1 %
Date Time Interrogation Session: 20210930120042
Implantable Lead Implant Date: 20130328
Implantable Lead Implant Date: 20130328
Implantable Lead Location: 753859
Implantable Lead Location: 753860
Implantable Pulse Generator Implant Date: 20130328
Lead Channel Impedance Value: 340 Ohm
Lead Channel Impedance Value: 380 Ohm
Lead Channel Pacing Threshold Amplitude: 0.625 V
Lead Channel Pacing Threshold Amplitude: 1.25 V
Lead Channel Pacing Threshold Pulse Width: 0.5 ms
Lead Channel Pacing Threshold Pulse Width: 0.7 ms
Lead Channel Sensing Intrinsic Amplitude: 2.8 mV
Lead Channel Sensing Intrinsic Amplitude: 3.9 mV
Lead Channel Setting Pacing Amplitude: 1.625
Lead Channel Setting Pacing Amplitude: 2.5 V
Lead Channel Setting Pacing Pulse Width: 0.7 ms
Lead Channel Setting Sensing Sensitivity: 2 mV
Pulse Gen Model: 2210
Pulse Gen Serial Number: 7334985

## 2020-05-05 NOTE — Progress Notes (Signed)
Remote pacemaker transmission.   

## 2020-05-12 DIAGNOSIS — J069 Acute upper respiratory infection, unspecified: Secondary | ICD-10-CM | POA: Diagnosis not present

## 2020-05-12 DIAGNOSIS — Z20828 Contact with and (suspected) exposure to other viral communicable diseases: Secondary | ICD-10-CM | POA: Diagnosis not present

## 2020-05-12 DIAGNOSIS — J029 Acute pharyngitis, unspecified: Secondary | ICD-10-CM | POA: Diagnosis not present

## 2020-05-13 DIAGNOSIS — E039 Hypothyroidism, unspecified: Secondary | ICD-10-CM | POA: Diagnosis not present

## 2020-05-13 DIAGNOSIS — E785 Hyperlipidemia, unspecified: Secondary | ICD-10-CM | POA: Diagnosis not present

## 2020-05-13 DIAGNOSIS — I2510 Atherosclerotic heart disease of native coronary artery without angina pectoris: Secondary | ICD-10-CM | POA: Diagnosis not present

## 2020-05-13 DIAGNOSIS — I4891 Unspecified atrial fibrillation: Secondary | ICD-10-CM | POA: Diagnosis not present

## 2020-05-13 DIAGNOSIS — I10 Essential (primary) hypertension: Secondary | ICD-10-CM | POA: Diagnosis not present

## 2020-05-13 DIAGNOSIS — E7800 Pure hypercholesterolemia, unspecified: Secondary | ICD-10-CM | POA: Diagnosis not present

## 2020-06-30 ENCOUNTER — Other Ambulatory Visit: Payer: Self-pay | Admitting: Cardiology

## 2020-06-30 NOTE — Telephone Encounter (Signed)
Pt last saw Dr Radford Pax 12/17/19, last labs 12/17/19 Creat 0.86, age 74, weight 73.8kg, based on specified criteria pt is on appropriate dosage of Eliquis 5mg  BID.  Will refill rx.

## 2020-08-01 DIAGNOSIS — K219 Gastro-esophageal reflux disease without esophagitis: Secondary | ICD-10-CM | POA: Diagnosis not present

## 2020-08-01 DIAGNOSIS — I10 Essential (primary) hypertension: Secondary | ICD-10-CM | POA: Diagnosis not present

## 2020-08-01 DIAGNOSIS — E7800 Pure hypercholesterolemia, unspecified: Secondary | ICD-10-CM | POA: Diagnosis not present

## 2020-08-01 DIAGNOSIS — I4891 Unspecified atrial fibrillation: Secondary | ICD-10-CM | POA: Diagnosis not present

## 2020-08-01 DIAGNOSIS — E785 Hyperlipidemia, unspecified: Secondary | ICD-10-CM | POA: Diagnosis not present

## 2020-08-01 DIAGNOSIS — I2510 Atherosclerotic heart disease of native coronary artery without angina pectoris: Secondary | ICD-10-CM | POA: Diagnosis not present

## 2020-08-01 DIAGNOSIS — E039 Hypothyroidism, unspecified: Secondary | ICD-10-CM | POA: Diagnosis not present

## 2020-08-05 ENCOUNTER — Ambulatory Visit (INDEPENDENT_AMBULATORY_CARE_PROVIDER_SITE_OTHER): Payer: Medicare HMO

## 2020-08-05 DIAGNOSIS — I495 Sick sinus syndrome: Secondary | ICD-10-CM | POA: Diagnosis not present

## 2020-08-06 LAB — CUP PACEART REMOTE DEVICE CHECK
Battery Remaining Longevity: 39 mo
Battery Remaining Percentage: 35 %
Battery Voltage: 2.83 V
Brady Statistic AP VP Percent: 1 %
Brady Statistic AP VS Percent: 54 %
Brady Statistic AS VP Percent: 1 %
Brady Statistic AS VS Percent: 45 %
Brady Statistic RA Percent Paced: 54 %
Brady Statistic RV Percent Paced: 1 %
Date Time Interrogation Session: 20220105013158
Implantable Lead Implant Date: 20130328
Implantable Lead Implant Date: 20130328
Implantable Lead Location: 753859
Implantable Lead Location: 753860
Implantable Pulse Generator Implant Date: 20130328
Lead Channel Impedance Value: 340 Ohm
Lead Channel Impedance Value: 350 Ohm
Lead Channel Pacing Threshold Amplitude: 0.625 V
Lead Channel Pacing Threshold Amplitude: 1.25 V
Lead Channel Pacing Threshold Pulse Width: 0.5 ms
Lead Channel Pacing Threshold Pulse Width: 0.7 ms
Lead Channel Sensing Intrinsic Amplitude: 3 mV
Lead Channel Sensing Intrinsic Amplitude: 4.2 mV
Lead Channel Setting Pacing Amplitude: 1.625
Lead Channel Setting Pacing Amplitude: 2.5 V
Lead Channel Setting Pacing Pulse Width: 0.7 ms
Lead Channel Setting Sensing Sensitivity: 2 mV
Pulse Gen Model: 2210
Pulse Gen Serial Number: 7334985

## 2020-08-20 NOTE — Progress Notes (Signed)
Remote pacemaker transmission.   

## 2020-09-11 ENCOUNTER — Telehealth: Payer: Self-pay | Admitting: Cardiology

## 2020-09-11 DIAGNOSIS — I2510 Atherosclerotic heart disease of native coronary artery without angina pectoris: Secondary | ICD-10-CM | POA: Diagnosis not present

## 2020-09-11 DIAGNOSIS — E785 Hyperlipidemia, unspecified: Secondary | ICD-10-CM | POA: Diagnosis not present

## 2020-09-11 DIAGNOSIS — E7800 Pure hypercholesterolemia, unspecified: Secondary | ICD-10-CM | POA: Diagnosis not present

## 2020-09-11 DIAGNOSIS — I10 Essential (primary) hypertension: Secondary | ICD-10-CM | POA: Diagnosis not present

## 2020-09-11 DIAGNOSIS — K219 Gastro-esophageal reflux disease without esophagitis: Secondary | ICD-10-CM | POA: Diagnosis not present

## 2020-09-11 DIAGNOSIS — E039 Hypothyroidism, unspecified: Secondary | ICD-10-CM | POA: Diagnosis not present

## 2020-09-11 DIAGNOSIS — I4891 Unspecified atrial fibrillation: Secondary | ICD-10-CM | POA: Diagnosis not present

## 2020-09-11 NOTE — Telephone Encounter (Signed)
Pt c/o medication issue:  1. Name of Medication: Zinc, Vitamin D3, Quercetin   2. How are you currently taking this medication (dosage and times per day)? Patient has not started taking these vitamins yet   3. Are you having a reaction (difficulty breathing--STAT)? no  4. What is your medication issue? Patient wanted to know if she would be safe to take these vitamins. She is on Eliquis and some other heart medications and does not want to risk interactions

## 2020-09-11 NOTE — Telephone Encounter (Signed)
Spoke with the patient and advised her that I did not see any interactions with her current medications and the supplements that she is looking into taking. Patient verbalized understanding.

## 2020-09-13 DIAGNOSIS — J019 Acute sinusitis, unspecified: Secondary | ICD-10-CM | POA: Diagnosis not present

## 2020-09-13 DIAGNOSIS — U071 COVID-19: Secondary | ICD-10-CM | POA: Diagnosis not present

## 2020-09-13 DIAGNOSIS — H01005 Unspecified blepharitis left lower eyelid: Secondary | ICD-10-CM | POA: Diagnosis not present

## 2020-10-31 ENCOUNTER — Ambulatory Visit (INDEPENDENT_AMBULATORY_CARE_PROVIDER_SITE_OTHER): Payer: Medicare HMO

## 2020-10-31 DIAGNOSIS — I495 Sick sinus syndrome: Secondary | ICD-10-CM | POA: Diagnosis not present

## 2020-11-04 LAB — CUP PACEART REMOTE DEVICE CHECK
Battery Remaining Longevity: 39 mo
Battery Remaining Percentage: 35 %
Battery Voltage: 2.83 V
Brady Statistic AP VP Percent: 1 %
Brady Statistic AP VS Percent: 57 %
Brady Statistic AS VP Percent: 1 %
Brady Statistic AS VS Percent: 43 %
Brady Statistic RA Percent Paced: 57 %
Brady Statistic RV Percent Paced: 1 %
Date Time Interrogation Session: 20220401124119
Implantable Lead Implant Date: 20130328
Implantable Lead Implant Date: 20130328
Implantable Lead Location: 753859
Implantable Lead Location: 753860
Implantable Pulse Generator Implant Date: 20130328
Lead Channel Impedance Value: 340 Ohm
Lead Channel Impedance Value: 380 Ohm
Lead Channel Pacing Threshold Amplitude: 0.625 V
Lead Channel Pacing Threshold Amplitude: 1.25 V
Lead Channel Pacing Threshold Pulse Width: 0.5 ms
Lead Channel Pacing Threshold Pulse Width: 0.7 ms
Lead Channel Sensing Intrinsic Amplitude: 2.3 mV
Lead Channel Sensing Intrinsic Amplitude: 5.6 mV
Lead Channel Setting Pacing Amplitude: 1.625
Lead Channel Setting Pacing Amplitude: 2.5 V
Lead Channel Setting Pacing Pulse Width: 0.7 ms
Lead Channel Setting Sensing Sensitivity: 2 mV
Pulse Gen Model: 2210
Pulse Gen Serial Number: 7334985

## 2020-11-11 ENCOUNTER — Other Ambulatory Visit: Payer: Self-pay | Admitting: Cardiology

## 2020-11-11 DIAGNOSIS — K219 Gastro-esophageal reflux disease without esophagitis: Secondary | ICD-10-CM | POA: Diagnosis not present

## 2020-11-11 DIAGNOSIS — I2510 Atherosclerotic heart disease of native coronary artery without angina pectoris: Secondary | ICD-10-CM | POA: Diagnosis not present

## 2020-11-11 DIAGNOSIS — I10 Essential (primary) hypertension: Secondary | ICD-10-CM | POA: Diagnosis not present

## 2020-11-11 DIAGNOSIS — E785 Hyperlipidemia, unspecified: Secondary | ICD-10-CM | POA: Diagnosis not present

## 2020-11-11 DIAGNOSIS — E7800 Pure hypercholesterolemia, unspecified: Secondary | ICD-10-CM | POA: Diagnosis not present

## 2020-11-11 DIAGNOSIS — E039 Hypothyroidism, unspecified: Secondary | ICD-10-CM | POA: Diagnosis not present

## 2020-11-11 DIAGNOSIS — I4891 Unspecified atrial fibrillation: Secondary | ICD-10-CM | POA: Diagnosis not present

## 2020-11-12 NOTE — Progress Notes (Signed)
Remote pacemaker transmission.   

## 2020-12-17 ENCOUNTER — Other Ambulatory Visit: Payer: Self-pay

## 2020-12-17 ENCOUNTER — Encounter: Payer: Self-pay | Admitting: Cardiology

## 2020-12-17 ENCOUNTER — Ambulatory Visit: Payer: Medicare HMO | Admitting: Cardiology

## 2020-12-17 VITALS — BP 120/86 | HR 69 | Ht 63.0 in | Wt 164.0 lb

## 2020-12-17 DIAGNOSIS — I495 Sick sinus syndrome: Secondary | ICD-10-CM | POA: Diagnosis not present

## 2020-12-17 DIAGNOSIS — I119 Hypertensive heart disease without heart failure: Secondary | ICD-10-CM | POA: Diagnosis not present

## 2020-12-17 DIAGNOSIS — I4819 Other persistent atrial fibrillation: Secondary | ICD-10-CM | POA: Diagnosis not present

## 2020-12-17 DIAGNOSIS — I2510 Atherosclerotic heart disease of native coronary artery without angina pectoris: Secondary | ICD-10-CM | POA: Diagnosis not present

## 2020-12-17 DIAGNOSIS — E7800 Pure hypercholesterolemia, unspecified: Secondary | ICD-10-CM | POA: Diagnosis not present

## 2020-12-17 DIAGNOSIS — I10 Essential (primary) hypertension: Secondary | ICD-10-CM | POA: Diagnosis not present

## 2020-12-17 LAB — CBC
Hematocrit: 40.8 % (ref 34.0–46.6)
Hemoglobin: 13.4 g/dL (ref 11.1–15.9)
MCH: 27.5 pg (ref 26.6–33.0)
MCHC: 32.8 g/dL (ref 31.5–35.7)
MCV: 84 fL (ref 79–97)
Platelets: 281 10*3/uL (ref 150–450)
RBC: 4.87 x10E6/uL (ref 3.77–5.28)
RDW: 12.8 % (ref 11.7–15.4)
WBC: 7.7 10*3/uL (ref 3.4–10.8)

## 2020-12-17 LAB — COMPREHENSIVE METABOLIC PANEL
ALT: 10 IU/L (ref 0–32)
AST: 20 IU/L (ref 0–40)
Albumin/Globulin Ratio: 1.8 (ref 1.2–2.2)
Albumin: 4.5 g/dL (ref 3.7–4.7)
Alkaline Phosphatase: 25 IU/L — ABNORMAL LOW (ref 44–121)
BUN/Creatinine Ratio: 14 (ref 12–28)
BUN: 12 mg/dL (ref 8–27)
Bilirubin Total: 0.5 mg/dL (ref 0.0–1.2)
CO2: 25 mmol/L (ref 20–29)
Calcium: 9.5 mg/dL (ref 8.7–10.3)
Chloride: 101 mmol/L (ref 96–106)
Creatinine, Ser: 0.84 mg/dL (ref 0.57–1.00)
Globulin, Total: 2.5 g/dL (ref 1.5–4.5)
Glucose: 88 mg/dL (ref 65–99)
Potassium: 4 mmol/L (ref 3.5–5.2)
Sodium: 140 mmol/L (ref 134–144)
Total Protein: 7 g/dL (ref 6.0–8.5)
eGFR: 73 mL/min/{1.73_m2} (ref >59)

## 2020-12-17 LAB — LIPID PANEL
Chol/HDL Ratio: 2.2 ratio (ref 0.0–4.4)
Cholesterol, Total: 142 mg/dL (ref 100–199)
HDL: 66 mg/dL (ref >39)
LDL Chol Calc (NIH): 60 mg/dL (ref 0–99)
Triglycerides: 83 mg/dL (ref 0–149)
VLDL Cholesterol Cal: 16 mg/dL (ref 5–40)

## 2020-12-17 MED ORDER — ELIQUIS 5 MG PO TABS: 1.0000 | ORAL_TABLET | Freq: Two times a day (BID) | ORAL | 5 refills | Status: DC

## 2020-12-17 MED ORDER — LISINOPRIL 10 MG PO TABS: 1.0000 | ORAL_TABLET | Freq: Every day | ORAL | 3 refills | Status: DC

## 2020-12-17 MED ORDER — METOPROLOL SUCCINATE ER 50 MG PO TB24: ORAL_TABLET | ORAL | 3 refills | Status: DC

## 2020-12-17 MED ORDER — ROSUVASTATIN CALCIUM 40 MG PO TABS: 40.0000 mg | ORAL_TABLET | Freq: Every day | ORAL | 3 refills | Status: DC

## 2020-12-17 NOTE — Progress Notes (Signed)
Cardiology Office Note:    Date:  12/17/2020   ID:  Cheryl [name], DOB 09-27-1945, MRN [id number]  PCP:  Aretta Nip, MD  Cardiologist:  Fransico Him, MD    Referring MD: Leighton Ruff, MD   Chief Complaint  Patient presents with  . Coronary Artery Disease  . Atrial Fibrillation  . Hyperlipidemia  . Hypertension    History of Present Illness:    Cheryl [name] is a 75 y.o. female with a hx of PAF/flutter s/p ablationx 2, tachy/brady syndrome s/p PPM, HTN, ASCADs/p PCI of OMand dyslipidemia.  She is here today for followup and is doing well.  She denies any chest pain or pressure, SOB, DOE, PND, orthopnea,  dizziness, palpitations or syncope. Occasionaly she will have some LE edema at the end of the day. She is compliant with her meds and is tolerating meds with no SE.    Past Medical History:  Diagnosis Date  . Anemia   . Arthritis   . Blood transfusion ~ 1962   " couple; no reaction " (05/30/2012)  . Coronary artery disease 2013   PCI PCI of OM  . Depression   . GERD (gastroesophageal reflux disease)   . H/O hiatal hernia   . HTN (hypertension)   . Hypercholesteremia   . Hypothyroidism   . Myocardial infarction [facility])    "very very light" (05/30/2012)  . PAF (paroxysmal atrial fibrillation) ([place]) 05/30/2012   s/p afib/flutter ablation 2013/2017  . Tachy-brady syndrome ([place]) 10/2011   a. post-termination pauses in setting of PAF;  b. 10/28/11 - St. Jude Dual Chamber PPM SN [id number]   . Ureteral obstruction, right     Past Surgical History:  Procedure Laterality Date  . ATRIAL FIBRILLATION ABLATION N/A 05/30/2012   PVI and CTi ablation by Dr Rayann Heman  . BREAST SURGERY  1990's   "right radial scar" (05/30/2012)  . CORONARY ANGIOPLASTY  ~ 04/2012  . CYSTOSCOPY WITH RETROGRADE PYELOGRAM, URETEROSCOPY AND STENT PLACEMENT  08/21/2012   Procedure: CYSTOSCOPY WITH RETROGRADE PYELOGRAM, URETEROSCOPY AND STENT PLACEMENT;  Surgeon: Franchot Gallo, MD;  Location: [facility]  ORS;  Service: Urology;  Laterality: Right;  URETEROSCOPY WITH BIOPSY   . ELECTROPHYSIOLOGIC STUDY N/A 09/23/2015   Procedure: Atrial Fibrillation Ablation;  Surgeon: Thompson Grayer, MD;  Location: [place] CV LAB;  Service: Cardiovascular;  Laterality: N/A;  . LEFT HEART CATHETERIZATION WITH CORONARY ANGIOGRAM N/A 04/04/2012   Procedure: LEFT HEART CATHETERIZATION WITH CORONARY ANGIOGRAM;  Surgeon: Sinclair Grooms, MD;  Location: [facility] CATH LAB;  Service: Cardiovascular;  Laterality: N/A;  . PERCUTANEOUS CORONARY INTERVENTION-BALLOON ONLY Right 04/04/2012   Procedure: PERCUTANEOUS CORONARY INTERVENTION-BALLOON ONLY;  Surgeon: Sinclair Grooms, MD;  Location: [facility] CATH LAB;  Service: Cardiovascular;  Laterality: Right;  . PERMANENT PACEMAKER INSERTION N/A 10/28/2011   Procedure: PERMANENT PACEMAKER INSERTION;  Surgeon: [name] Lance, MD;  Location: [facility] CATH LAB;  Service: Cardiovascular;  Laterality: N/A;  . TEE WITHOUT CARDIOVERSION  05/29/2012   Procedure: TRANSESOPHAGEAL ECHOCARDIOGRAM (TEE);  Surgeon: Sueanne Margarita, MD;  Location: [facility] ENDOSCOPY;  Service: Cardiovascular;  Laterality: N/A;  h/p in file drawer/dl    Current Medications: Current Meds  Medication Sig  . ELIQUIS 5 MG TABS tablet TAKE 1 TABLET BY MOUTH TWICE DAILY  . levothyroxine (SYNTHROID) 100 MCG tablet TAKE 1 TABLET BY MOUTH DAILY BEFORE BREAKFAST  . lisinopril (ZESTRIL) 10 MG tablet TAKE 1 TABLET BY MOUTH DAILY  . metoprolol succinate (TOPROL-XL) 50 MG 24 hr tablet  TAKE 1 TABLET BY MOUTH DAILY WITH OR IMMEDIATELY FOLLOWING A MEAL  . rosuvastatin (CRESTOR) 40 MG tablet TAKE 1 TABLET BY MOUTH DAILY     Allergies:   Latex, Protonix [pantoprazole sodium], Codeine, and Tape   Social History   Socioeconomic History  . Marital status: Married    Spouse name: Not on file  . Number of children: 4  . Years of education: Not on file  . Highest education level: Not on file  Occupational History  . Not on file  Tobacco Use  .  Smoking status: Former Smoker    Packs/day: 0.12    Years: 15.00    Pack years: 1.80    Types: Cigarettes    Quit date: 06/17/1977    Years since quitting: 43.5  . Smokeless tobacco: Never Used  . Tobacco comment: QUIT smoking cigarettes 1978  Vaping Use  . Vaping Use: Never used  Substance and Sexual Activity  . Alcohol use: No  . Drug use: No  . Sexual activity: Never    Birth control/protection: Post-menopausal  Other Topics Concern  . Not on file  Social History Narrative   Lives with husband.  He runs a Programmer, systems   Social Determinants of Radio broadcast assistant Strain: Not on file  Food Insecurity: Not on file  Transportation Needs: Not on file  Physical Activity: Not on file  Stress: Not on file  Social Connections: Not on file     Family History: The patient's family history includes Breast cancer in her mother; Heart attack (age of onset: 51) in her father; Heart attack (age of onset: 72) in her mother; Pancreatic cancer in her mother; Uterine cancer in her mother.  ROS:   Please see the history of present illness.    Review of Systems  Musculoskeletal: Negative for muscle weakness.    All other systems reviewed and negative.   EKGs/Labs/Other Studies Reviewed:    The following studies were reviewed today: PAP compliance download  EKG:  EKG is ordered today and showed atrial paced rhythm with nonspecific ST abnormality  Recent Labs: No results found for requested labs within last 8760 hours.   Recent Lipid Panel    Component Value Date/Time   CHOL 134 12/17/2019 1201   TRIG 87 12/17/2019 1201   HDL 57 12/17/2019 1201   CHOLHDL 2.4 12/17/2019 1201   CHOLHDL 2.0 05/21/2016 1100   VLDL 12 05/21/2016 1100   LDLCALC 60 12/17/2019 1201    Physical Exam:    VS:  BP 120/86   Pulse 69   Ht 5\' 3"  (1.6 m)   Wt 164 lb (74.4 kg)   SpO2 96%   BMI 29.05 kg/m     Wt Readings from Last 3 Encounters:  12/17/20 164 lb (74.4 kg)   12/17/19 162 lb 12.8 oz (73.8 kg)  11/16/18 160 lb (72.6 kg)    GEN: Well nourished, well developed in no acute distress HEENT: Normal NECK: No JVD; No carotid bruits LYMPHATICS: No lymphadenopathy CARDIAC:RRR, no murmurs, rubs, gallops RESPIRATORY:  Clear to auscultation without rales, wheezing or rhonchi  ABDOMEN: Soft, non-tender, non-distended MUSCULOSKELETAL:  No edema; No deformity  SKIN: Warm and dry NEUROLOGIC:  Alert and oriented x 3 PSYCHIATRIC:  Normal affect    ASSESSMENT:    1. Coronary artery disease involving native coronary artery of native heart without angina pectoris   2. Persistent atrial fibrillation ([place])   3. Hypercholesteremia   4. Primary hypertension  5. Tachy-brady syndrome ([place])    PLAN:    In order of problems listed above:  1.  ASCAD  - s/p cath in 2013 with  PCI of OM.  -she has not had any anginal symptoms since I saw her last -Continue prescription drug management with Toprol and statin -no ASA due to DOAC  2.  Persistent atrial fibrillation  -s/p ablation x 2 -she is maintaining NSR and denies any palpitations -she has not had any bleeding issues on Eliquis -Continue prescription drug management with Toprol XL 50mg  daily and Eliquis 5mg  BID -I will check a CBC and BMET today  3.  Hyperlipidemia  - her LDL goal is < 70.   -Continue prescription drug management with Crestor 40mg  daily -repeat FLP and ALT  4.  Hypertension  -BP is adequately controlled on exam today -Continue prescription drug management with Lisinopril 10mg  daily and Toprol XL 25mg  daily with PRN refills   5.  Tachy-Brady syndrome  - she is s/p PPM which is followed in our device clinic.    Medication Adjustments/Labs and Tests Ordered: Current medicines are reviewed at length with the patient today.  Concerns regarding medicines are outlined above.  Orders Placed This Encounter  Procedures  . EKG 12-Lead   No orders of the defined types were placed in  this encounter.   Signed, Fransico Him, MD  12/17/2020 11:13 AM    [place]

## 2020-12-17 NOTE — Patient Instructions (Signed)
Medication Instructions:  Your physician recommends that you continue on your current medications as directed. Please refer to the Current Medication list given to you today.  *If you need a refill on your cardiac medications before your next appointment, please call your pharmacy*   Lab Work: TODAY: CMET, CBC, and FLP If you have labs (blood work) drawn today and your tests are completely normal, you will receive your results only by: Mar[place] Kitchen MyChart Message (if you have MyChart) OR . A paper copy in the mail If you have any lab test that is abnormal or we need to change your treatment, we will call you to review the results.  Follow-Up: At [facility], you and your health needs are our priority.  As part of our continuing mission to provide you with exceptional heart care, we have created designated Provider Care Teams.  These Care Teams include your primary Cardiologist (physician) and Advanced Practice Providers (APPs -  Physician Assistants and Nurse Practitioners) who all work together to provide you with the care you need, when you need it.  Your next appointment:   6 month(s)  The format for your next appointment:   In Person  Provider:   You may see Fransico Him, MD or one of the following Advanced Practice Providers on your designated Care Team:    Melina Copa, PA-C  Ermalinda Barrios, PA-C

## 2020-12-17 NOTE — Addendum Note (Signed)
Addended by: Antonieta Iba on: 12/17/2020 11:19 AM   Modules accepted: Orders

## 2020-12-18 DIAGNOSIS — I10 Essential (primary) hypertension: Secondary | ICD-10-CM | POA: Diagnosis not present

## 2020-12-18 DIAGNOSIS — I4891 Unspecified atrial fibrillation: Secondary | ICD-10-CM | POA: Diagnosis not present

## 2020-12-18 DIAGNOSIS — E7800 Pure hypercholesterolemia, unspecified: Secondary | ICD-10-CM | POA: Diagnosis not present

## 2020-12-18 DIAGNOSIS — E039 Hypothyroidism, unspecified: Secondary | ICD-10-CM | POA: Diagnosis not present

## 2020-12-18 DIAGNOSIS — I2510 Atherosclerotic heart disease of native coronary artery without angina pectoris: Secondary | ICD-10-CM | POA: Diagnosis not present

## 2020-12-18 DIAGNOSIS — E785 Hyperlipidemia, unspecified: Secondary | ICD-10-CM | POA: Diagnosis not present

## 2020-12-18 DIAGNOSIS — K219 Gastro-esophageal reflux disease without esophagitis: Secondary | ICD-10-CM | POA: Diagnosis not present

## 2021-01-30 ENCOUNTER — Ambulatory Visit (INDEPENDENT_AMBULATORY_CARE_PROVIDER_SITE_OTHER): Payer: Medicare HMO

## 2021-01-30 DIAGNOSIS — I495 Sick sinus syndrome: Secondary | ICD-10-CM

## 2021-02-02 LAB — CUP PACEART REMOTE DEVICE CHECK
Battery Remaining Longevity: 9 mo
Battery Remaining Percentage: 8 %
Battery Voltage: 2.8 V
Brady Statistic AP VP Percent: 1 %
Brady Statistic AP VS Percent: 59 %
Brady Statistic AS VP Percent: 1 %
Brady Statistic AS VS Percent: 40 %
Brady Statistic RA Percent Paced: 59 %
Brady Statistic RV Percent Paced: 1 %
Date Time Interrogation Session: 20220701053808
Implantable Lead Implant Date: 20130328
Implantable Lead Implant Date: 20130328
Implantable Lead Location: 753859
Implantable Lead Location: 753860
Implantable Pulse Generator Implant Date: 20130328
Lead Channel Impedance Value: 350 Ohm
Lead Channel Impedance Value: 360 Ohm
Lead Channel Pacing Threshold Amplitude: 0.75 V
Lead Channel Pacing Threshold Amplitude: 1.25 V
Lead Channel Pacing Threshold Pulse Width: 0.5 ms
Lead Channel Pacing Threshold Pulse Width: 0.7 ms
Lead Channel Sensing Intrinsic Amplitude: 3.6 mV
Lead Channel Sensing Intrinsic Amplitude: 6.7 mV
Lead Channel Setting Pacing Amplitude: 1.75 V
Lead Channel Setting Pacing Amplitude: 2.5 V
Lead Channel Setting Pacing Pulse Width: 0.7 ms
Lead Channel Setting Sensing Sensitivity: 2 mV
Pulse Gen Model: 2210
Pulse Gen Serial Number: 7334985

## 2021-02-11 DIAGNOSIS — I2510 Atherosclerotic heart disease of native coronary artery without angina pectoris: Secondary | ICD-10-CM | POA: Diagnosis not present

## 2021-02-11 DIAGNOSIS — I10 Essential (primary) hypertension: Secondary | ICD-10-CM | POA: Diagnosis not present

## 2021-02-11 DIAGNOSIS — I4891 Unspecified atrial fibrillation: Secondary | ICD-10-CM | POA: Diagnosis not present

## 2021-02-11 DIAGNOSIS — Z950 Presence of cardiac pacemaker: Secondary | ICD-10-CM | POA: Diagnosis not present

## 2021-02-11 DIAGNOSIS — E7800 Pure hypercholesterolemia, unspecified: Secondary | ICD-10-CM | POA: Diagnosis not present

## 2021-02-11 DIAGNOSIS — Z2821 Immunization not carried out because of patient refusal: Secondary | ICD-10-CM | POA: Diagnosis not present

## 2021-02-11 DIAGNOSIS — E039 Hypothyroidism, unspecified: Secondary | ICD-10-CM | POA: Diagnosis not present

## 2021-02-12 DIAGNOSIS — E785 Hyperlipidemia, unspecified: Secondary | ICD-10-CM | POA: Diagnosis not present

## 2021-02-12 DIAGNOSIS — I4891 Unspecified atrial fibrillation: Secondary | ICD-10-CM | POA: Diagnosis not present

## 2021-02-12 DIAGNOSIS — E039 Hypothyroidism, unspecified: Secondary | ICD-10-CM | POA: Diagnosis not present

## 2021-02-12 DIAGNOSIS — K219 Gastro-esophageal reflux disease without esophagitis: Secondary | ICD-10-CM | POA: Diagnosis not present

## 2021-02-12 DIAGNOSIS — I2510 Atherosclerotic heart disease of native coronary artery without angina pectoris: Secondary | ICD-10-CM | POA: Diagnosis not present

## 2021-02-12 DIAGNOSIS — I10 Essential (primary) hypertension: Secondary | ICD-10-CM | POA: Diagnosis not present

## 2021-02-17 ENCOUNTER — Other Ambulatory Visit: Payer: Self-pay | Admitting: Cardiology

## 2021-02-18 NOTE — Progress Notes (Signed)
Remote pacemaker transmission.   

## 2021-03-04 DIAGNOSIS — M170 Bilateral primary osteoarthritis of knee: Secondary | ICD-10-CM | POA: Diagnosis not present

## 2021-03-18 DIAGNOSIS — M170 Bilateral primary osteoarthritis of knee: Secondary | ICD-10-CM | POA: Diagnosis not present

## 2021-03-25 DIAGNOSIS — M170 Bilateral primary osteoarthritis of knee: Secondary | ICD-10-CM | POA: Diagnosis not present

## 2021-04-01 DIAGNOSIS — M170 Bilateral primary osteoarthritis of knee: Secondary | ICD-10-CM | POA: Diagnosis not present

## 2021-04-22 DIAGNOSIS — I4891 Unspecified atrial fibrillation: Secondary | ICD-10-CM | POA: Diagnosis not present

## 2021-04-22 DIAGNOSIS — E785 Hyperlipidemia, unspecified: Secondary | ICD-10-CM | POA: Diagnosis not present

## 2021-04-22 DIAGNOSIS — I2510 Atherosclerotic heart disease of native coronary artery without angina pectoris: Secondary | ICD-10-CM | POA: Diagnosis not present

## 2021-04-22 DIAGNOSIS — I10 Essential (primary) hypertension: Secondary | ICD-10-CM | POA: Diagnosis not present

## 2021-04-22 DIAGNOSIS — E039 Hypothyroidism, unspecified: Secondary | ICD-10-CM | POA: Diagnosis not present

## 2021-04-22 DIAGNOSIS — K219 Gastro-esophageal reflux disease without esophagitis: Secondary | ICD-10-CM | POA: Diagnosis not present

## 2021-05-15 ENCOUNTER — Ambulatory Visit (INDEPENDENT_AMBULATORY_CARE_PROVIDER_SITE_OTHER): Payer: Medicare HMO

## 2021-05-15 DIAGNOSIS — I495 Sick sinus syndrome: Secondary | ICD-10-CM

## 2021-05-19 ENCOUNTER — Telehealth: Payer: Self-pay

## 2021-05-19 LAB — CUP PACEART REMOTE DEVICE CHECK
Battery Remaining Longevity: 6 mo
Battery Remaining Percentage: 6 %
Battery Voltage: 2.77 V
Brady Statistic AP VP Percent: 1 %
Brady Statistic AP VS Percent: 61 %
Brady Statistic AS VP Percent: 1 %
Brady Statistic AS VS Percent: 39 %
Brady Statistic RA Percent Paced: 61 %
Brady Statistic RV Percent Paced: 1 %
Date Time Interrogation Session: 20221014151736
Implantable Lead Implant Date: 20130328
Implantable Lead Implant Date: 20130328
Implantable Lead Location: 753859
Implantable Lead Location: 753860
Implantable Pulse Generator Implant Date: 20130328
Lead Channel Impedance Value: 380 Ohm
Lead Channel Impedance Value: 400 Ohm
Lead Channel Pacing Threshold Amplitude: 0.75 V
Lead Channel Pacing Threshold Amplitude: 1.25 V
Lead Channel Pacing Threshold Pulse Width: 0.5 ms
Lead Channel Pacing Threshold Pulse Width: 0.7 ms
Lead Channel Sensing Intrinsic Amplitude: 3.4 mV
Lead Channel Sensing Intrinsic Amplitude: 7 mV
Lead Channel Setting Pacing Amplitude: 1.75 V
Lead Channel Setting Pacing Amplitude: 2.5 V
Lead Channel Setting Pacing Pulse Width: 0.7 ms
Lead Channel Setting Sensing Sensitivity: 2 mV
Pulse Gen Model: 2210
Pulse Gen Serial Number: 7334985

## 2021-05-19 NOTE — Telephone Encounter (Signed)
Scheduled remote reviewed. Normal device function.   Battery estimated 6.34mo, route to triage Next remote to be determined In office device check scheduled 10/24 LR  Successful telephone encounter to patient to discuss batter longevity and need for monthly remote monitoring. Patient appreciative of call. All questions answered. Confirmed patient appointment with Dr. Lovena Le 05/25/21. Monthly battery checks scheduled in epic and [place].

## 2021-05-22 NOTE — Progress Notes (Signed)
Remote pacemaker transmission.   

## 2021-05-25 ENCOUNTER — Other Ambulatory Visit: Payer: Self-pay

## 2021-05-25 ENCOUNTER — Ambulatory Visit: Payer: Medicare HMO | Admitting: Internal Medicine

## 2021-05-25 VITALS — BP 126/70 | HR 61 | Ht 63.0 in | Wt 164.4 lb

## 2021-05-25 DIAGNOSIS — I10 Essential (primary) hypertension: Secondary | ICD-10-CM

## 2021-05-25 DIAGNOSIS — R001 Bradycardia, unspecified: Secondary | ICD-10-CM | POA: Diagnosis not present

## 2021-05-25 DIAGNOSIS — I480 Paroxysmal atrial fibrillation: Secondary | ICD-10-CM

## 2021-05-25 NOTE — Patient Instructions (Addendum)
Medication Instructions:  Your physician recommends that you continue on your current medications as directed. Please refer to the Current Medication list given to you today. *If you need a refill on your cardiac medications before your next appointment, please call your pharmacy*  Lab Work: None. If you have labs (blood work) drawn today and your tests are completely normal, you will receive your results only by: [place] (if you have MyChart) OR A paper copy in the mail If you have any lab test that is abnormal or we need to change your treatment, we will call you to review the results.  Testing/Procedures: None.  Follow-Up: At [facility], you and your health needs are our priority.  As part of our continuing mission to provide you with exceptional heart care, we have created designated Provider Care Teams.  These Care Teams include your primary Cardiologist (physician) and Advanced Practice Providers (APPs -  Physician Assistants and Nurse Practitioners) who all work together to provide you with the care you need, when you need it.  Your physician wants you to follow-up in: 12 months with  Dr. Lovena Le.   You will receive a reminder letter in the mail two months in advance. If you don't receive a letter, please call our office to schedule the follow-up appointment.  Remote monitoring is used to monitor your Pacemaker from home. This monitoring reduces the number of office visits required to check your device to one time per year. It allows [place] to keep an eye on the functioning of your device to ensure it is working properly. You are scheduled for a device check from home on 06/15/21. You may send your transmission at any time that day. If you have a wireless device, the transmission will be sent automatically. After your physician reviews your transmission, you will receive a postcard with your next transmission date.  We recommend signing up for the patient portal called "MyChart".   Sign up information is provided on this After Visit Summary.  MyChart is used to connect with patients for Virtual Visits (Telemedicine).  Patients are able to view lab/test results, encounter notes, upcoming appointments, etc.  Non-urgent messages can be sent to your provider as well.   To learn more about what you can do with MyChart, go to [email or web address].    Any Other Special Instructions Will Be Listed Below (If Applicable).

## 2021-05-25 NOTE — Progress Notes (Signed)
PCP: Aretta Nip, MD Primary Cardiologist: Dr Radford Pax Primary EP:  Dr Dakiya Puopolo/ Cheryl [name] is a 75 y.o. female who presents today for routine electrophysiology followup.  Since last being seen in our clinic, the patient reports doing very well.  Today, she denies symptoms of palpitations, chest pain, shortness of breath,  lower extremity edema, dizziness, presyncope, or syncope.  The patient is otherwise without complaint today.   Past Medical History:  Diagnosis Date   Anemia    Arthritis    Blood transfusion ~ 1962   " couple; no reaction " (05/30/2012)   Coronary artery disease 2013   PCI PCI of OM   Depression    GERD (gastroesophageal reflux disease)    H/O hiatal hernia    HTN (hypertension)    Hypercholesteremia    Hypothyroidism    Myocardial infarction ([place])    "very very light" (05/30/2012)   PAF (paroxysmal atrial fibrillation) ([place]) 05/30/2012   s/p afib/flutter ablation 2013/2017   Tachy-brady syndrome ([place]) 10/2011   a. post-termination pauses in setting of PAF;  b. 10/28/11 - St. Jude Dual Chamber PPM SN [id number]    Ureteral obstruction, right    Past Surgical History:  Procedure Laterality Date   ATRIAL FIBRILLATION ABLATION N/A 05/30/2012   PVI and CTi ablation by Dr Rayann Heman   BREAST SURGERY  1990's   "right radial scar" (05/30/2012)   CORONARY ANGIOPLASTY  ~ 04/2012   CYSTOSCOPY WITH RETROGRADE PYELOGRAM, URETEROSCOPY AND STENT PLACEMENT  08/21/2012   Procedure: [place], URETEROSCOPY AND STENT PLACEMENT;  Surgeon: Franchot Gallo, MD;  Location: [facility] ORS;  Service: Urology;  Laterality: Right;  URETEROSCOPY WITH BIOPSY    ELECTROPHYSIOLOGIC STUDY N/A 09/23/2015   Procedure: Atrial Fibrillation Ablation;  Surgeon: Cheryl Grayer, MD;  Location: [place] CV LAB;  Service: Cardiovascular;  Laterality: N/A;   LEFT HEART CATHETERIZATION WITH CORONARY ANGIOGRAM N/A 04/04/2012   Procedure: LEFT HEART CATHETERIZATION WITH  CORONARY ANGIOGRAM;  Surgeon: Sinclair Grooms, MD;  Location: [facility] CATH LAB;  Service: Cardiovascular;  Laterality: N/A;   PERCUTANEOUS CORONARY INTERVENTION-BALLOON ONLY Right 04/04/2012   Procedure: PERCUTANEOUS CORONARY INTERVENTION-BALLOON ONLY;  Surgeon: Sinclair Grooms, MD;  Location: [facility] CATH LAB;  Service: Cardiovascular;  Laterality: Right;   PERMANENT PACEMAKER INSERTION N/A 10/28/2011   Procedure: PERMANENT PACEMAKER INSERTION;  Surgeon: [name] Lance, MD;  Location: [facility] CATH LAB;  Service: Cardiovascular;  Laterality: N/A;   TEE WITHOUT CARDIOVERSION  05/29/2012   Procedure: TRANSESOPHAGEAL ECHOCARDIOGRAM (TEE);  Surgeon: Sueanne Margarita, MD;  Location: [facility] ENDOSCOPY;  Service: Cardiovascular;  Laterality: N/A;  h/p in file drawer/dl    ROS- all systems are reviewed and negative except as per HPI above  Current Outpatient Medications  Medication Sig Dispense Refill   apixaban (ELIQUIS) 5 MG TABS tablet Take 1 tablet (5 mg total) by mouth 2 (two) times daily. 60 tablet 5   levothyroxine (SYNTHROID) 100 MCG tablet TAKE 1 TABLET BY MOUTH EVERY DAY BEFORE BREAKFAST 90 tablet 2   lisinopril (ZESTRIL) 10 MG tablet Take 1 tablet (10 mg total) by mouth daily. 90 tablet 3   metoprolol succinate (TOPROL-XL) 50 MG 24 hr tablet Take with or immediately following a meal. 90 tablet 3   rosuvastatin (CRESTOR) 40 MG tablet Take 1 tablet (40 mg total) by mouth daily. 90 tablet 3   No current facility-administered medications for this visit.    Physical Exam: There were no vitals filed  for this visit.  GEN- The patient is well appearing, alert and oriented x 3 today.   Head- normocephalic, atraumatic Eyes-  Sclera clear, conjunctiva pink Ears- hearing intact Oropharynx- clear Lungs- Clear to ausculation bilaterally, normal work of breathing Chest- pacemaker pocket is well healed Heart- Regular rate and rhythm, no murmurs, rubs or gallops, PMI not laterally displaced GI- soft, NT, ND, +  BS Extremities- no clubbing, cyanosis, or edema  Pacemaker interrogation- reviewed in detail today,  See PACEART report  ekg tracing ordered today is personally reviewed and shows atrial paced rhythm  Assessment and Plan:  1. Symptomatic sinus bradycardia  Normal pacemaker function See Pace Art report No changes today she is not device dependant today Approaching ERI Risks, benefits, and alternatives to PPM pulse generator replacement were discussed in detail today.  The patient understands that risks include but are not limited to bleeding, infection, pneumothorax, perforation, tamponade, vascular damage, renal failure, MI, stroke, death, damage to his existing leads, and lead dislodgement and wishes to proceed once ERI. Ok to schedule generator change with me or Dr Lovena Le once she reaches ERI. Hold eliquis 24 hours prior to the procedure.  2. Atrial fibrillation Well controlled post ablation off AAD therapy (AF burden is 2%) Chads2vacs score is 3.  She is on eliquis  3. HTN Stable No change required today   Risks, benefits and potential toxicities for medications prescribed and/or refilled reviewed with patient today.   Cheryl Grayer MD, [facility] 05/25/2021 2:50 PM

## 2021-06-16 DIAGNOSIS — H40033 Anatomical narrow angle, bilateral: Secondary | ICD-10-CM | POA: Diagnosis not present

## 2021-06-16 DIAGNOSIS — H2513 Age-related nuclear cataract, bilateral: Secondary | ICD-10-CM | POA: Diagnosis not present

## 2021-06-17 DIAGNOSIS — H524 Presbyopia: Secondary | ICD-10-CM | POA: Diagnosis not present

## 2021-06-17 DIAGNOSIS — H52223 Regular astigmatism, bilateral: Secondary | ICD-10-CM | POA: Diagnosis not present

## 2021-06-19 ENCOUNTER — Other Ambulatory Visit: Payer: Self-pay | Admitting: Cardiology

## 2021-06-19 NOTE — Telephone Encounter (Signed)
Prescription refill request for Eliquis received.  Indication: afib  Last office visit: [place], 05/25/2021 Scr: 0.84, 12/17/2020 Age: 75 yo  Weight:  74.6 kg   Refill sent.

## 2021-07-16 ENCOUNTER — Ambulatory Visit (INDEPENDENT_AMBULATORY_CARE_PROVIDER_SITE_OTHER): Payer: Medicare HMO

## 2021-07-16 DIAGNOSIS — I495 Sick sinus syndrome: Secondary | ICD-10-CM

## 2021-07-16 LAB — CUP PACEART REMOTE DEVICE CHECK
Battery Remaining Longevity: 5 mo
Battery Remaining Percentage: 5 %
Battery Voltage: 2.75 V
Brady Statistic AP VP Percent: 1 %
Brady Statistic AP VS Percent: 74 %
Brady Statistic AS VP Percent: 1 %
Brady Statistic AS VS Percent: 26 %
Brady Statistic RA Percent Paced: 73 %
Brady Statistic RV Percent Paced: 1 %
Date Time Interrogation Session: 20221215021147
Implantable Lead Implant Date: 20130328
Implantable Lead Implant Date: 20130328
Implantable Lead Location: 753859
Implantable Lead Location: 753860
Implantable Pulse Generator Implant Date: 20130328
Lead Channel Impedance Value: 350 Ohm
Lead Channel Impedance Value: 380 Ohm
Lead Channel Pacing Threshold Amplitude: 0.75 V
Lead Channel Pacing Threshold Amplitude: 1.25 V
Lead Channel Pacing Threshold Pulse Width: 0.5 ms
Lead Channel Pacing Threshold Pulse Width: 0.7 ms
Lead Channel Sensing Intrinsic Amplitude: 1.7 mV
Lead Channel Sensing Intrinsic Amplitude: 7.2 mV
Lead Channel Setting Pacing Amplitude: 1.75 V
Lead Channel Setting Pacing Amplitude: 2.5 V
Lead Channel Setting Pacing Pulse Width: 0.7 ms
Lead Channel Setting Sensing Sensitivity: 2 mV
Pulse Gen Model: 2210
Pulse Gen Serial Number: 7334985

## 2021-07-28 NOTE — Progress Notes (Signed)
Remote pacemaker transmission.   

## 2021-08-17 ENCOUNTER — Ambulatory Visit (INDEPENDENT_AMBULATORY_CARE_PROVIDER_SITE_OTHER): Payer: Medicare HMO

## 2021-08-17 DIAGNOSIS — I495 Sick sinus syndrome: Secondary | ICD-10-CM

## 2021-08-18 LAB — CUP PACEART REMOTE DEVICE CHECK
Battery Remaining Longevity: 5 mo
Battery Remaining Percentage: 5 %
Battery Voltage: 2.74 V
Brady Statistic AP VP Percent: 1 %
Brady Statistic AP VS Percent: 78 %
Brady Statistic AS VP Percent: 1 %
Brady Statistic AS VS Percent: 22 %
Brady Statistic RA Percent Paced: 77 %
Brady Statistic RV Percent Paced: 1 %
Date Time Interrogation Session: 20230116022309
Implantable Lead Implant Date: 20130328
Implantable Lead Implant Date: 20130328
Implantable Lead Location: 753859
Implantable Lead Location: 753860
Implantable Pulse Generator Implant Date: 20130328
Lead Channel Impedance Value: 360 Ohm
Lead Channel Impedance Value: 380 Ohm
Lead Channel Pacing Threshold Amplitude: 0.75 V
Lead Channel Pacing Threshold Amplitude: 1.25 V
Lead Channel Pacing Threshold Pulse Width: 0.5 ms
Lead Channel Pacing Threshold Pulse Width: 0.7 ms
Lead Channel Sensing Intrinsic Amplitude: 3.1 mV
Lead Channel Sensing Intrinsic Amplitude: 7.3 mV
Lead Channel Setting Pacing Amplitude: 1.75 V
Lead Channel Setting Pacing Amplitude: 2.5 V
Lead Channel Setting Pacing Pulse Width: 0.7 ms
Lead Channel Setting Sensing Sensitivity: 2 mV
Pulse Gen Model: 2210
Pulse Gen Serial Number: 7334985

## 2021-08-26 NOTE — Progress Notes (Signed)
Remote pacemaker transmission.   

## 2021-09-18 ENCOUNTER — Telehealth: Payer: Self-pay | Admitting: Internal Medicine

## 2021-09-18 NOTE — Telephone Encounter (Signed)
Patient states she is returning a call. Assumes it was regarding her PPM. Called Device Clinic, but no answer. Please advise.

## 2021-09-18 NOTE — Telephone Encounter (Signed)
Successful telephone encounter to patient to inform her she has not been called from device clinic. Unable to determine who attempted to contact patient. Patient states she answered a ""[place]" call however there was a bad connection.

## 2021-10-19 ENCOUNTER — Ambulatory Visit (INDEPENDENT_AMBULATORY_CARE_PROVIDER_SITE_OTHER): Payer: Medicare HMO

## 2021-10-19 DIAGNOSIS — I495 Sick sinus syndrome: Secondary | ICD-10-CM

## 2021-10-20 LAB — CUP PACEART REMOTE DEVICE CHECK
Battery Remaining Longevity: 4 mo
Battery Remaining Percentage: 4 %
Battery Voltage: 2.72 V
Brady Statistic AP VP Percent: 1 %
Brady Statistic AP VS Percent: 83 %
Brady Statistic AS VP Percent: 1 %
Brady Statistic AS VS Percent: 17 %
Brady Statistic RA Percent Paced: 82 %
Brady Statistic RV Percent Paced: 1 %
Date Time Interrogation Session: 20230320034834
Implantable Lead Implant Date: 20130328
Implantable Lead Implant Date: 20130328
Implantable Lead Location: 753859
Implantable Lead Location: 753860
Implantable Pulse Generator Implant Date: 20130328
Lead Channel Impedance Value: 350 Ohm
Lead Channel Impedance Value: 350 Ohm
Lead Channel Pacing Threshold Amplitude: 0.75 V
Lead Channel Pacing Threshold Amplitude: 1.25 V
Lead Channel Pacing Threshold Pulse Width: 0.5 ms
Lead Channel Pacing Threshold Pulse Width: 0.7 ms
Lead Channel Sensing Intrinsic Amplitude: 2.1 mV
Lead Channel Sensing Intrinsic Amplitude: 6.9 mV
Lead Channel Setting Pacing Amplitude: 1.75 V
Lead Channel Setting Pacing Amplitude: 2.5 V
Lead Channel Setting Pacing Pulse Width: 0.7 ms
Lead Channel Setting Sensing Sensitivity: 2 mV
Pulse Gen Model: 2210
Pulse Gen Serial Number: 7334985

## 2021-11-03 NOTE — Addendum Note (Signed)
Addended by: Cheri Kearns A on: 11/03/2021 11:30 AM ? ? Modules accepted: Level of Service ? ?

## 2021-11-03 NOTE — Progress Notes (Signed)
Remote pacemaker transmission.   

## 2021-11-18 DIAGNOSIS — M170 Bilateral primary osteoarthritis of knee: Secondary | ICD-10-CM | POA: Diagnosis not present

## 2021-11-19 ENCOUNTER — Ambulatory Visit (INDEPENDENT_AMBULATORY_CARE_PROVIDER_SITE_OTHER): Payer: Medicare HMO

## 2021-11-19 DIAGNOSIS — I495 Sick sinus syndrome: Secondary | ICD-10-CM

## 2021-11-20 LAB — CUP PACEART REMOTE DEVICE CHECK
Battery Remaining Longevity: 4 mo
Battery Remaining Percentage: 3 %
Battery Voltage: 2.71 V
Brady Statistic AP VP Percent: 1 %
Brady Statistic AP VS Percent: 84 %
Brady Statistic AS VP Percent: 1 %
Brady Statistic AS VS Percent: 16 %
Brady Statistic RA Percent Paced: 83 %
Brady Statistic RV Percent Paced: 1 %
Date Time Interrogation Session: 20230421110222
Implantable Lead Implant Date: 20130328
Implantable Lead Implant Date: 20130328
Implantable Lead Location: 753859
Implantable Lead Location: 753860
Implantable Pulse Generator Implant Date: 20130328
Lead Channel Impedance Value: 380 Ohm
Lead Channel Impedance Value: 380 Ohm
Lead Channel Pacing Threshold Amplitude: 0.875 V
Lead Channel Pacing Threshold Amplitude: 1.25 V
Lead Channel Pacing Threshold Pulse Width: 0.5 ms
Lead Channel Pacing Threshold Pulse Width: 0.7 ms
Lead Channel Sensing Intrinsic Amplitude: 2.8 mV
Lead Channel Sensing Intrinsic Amplitude: 7.4 mV
Lead Channel Setting Pacing Amplitude: 1.875
Lead Channel Setting Pacing Amplitude: 2.5 V
Lead Channel Setting Pacing Pulse Width: 0.7 ms
Lead Channel Setting Sensing Sensitivity: 2 mV
Pulse Gen Model: 2210
Pulse Gen Serial Number: 7334985

## 2021-12-02 DIAGNOSIS — M170 Bilateral primary osteoarthritis of knee: Secondary | ICD-10-CM | POA: Diagnosis not present

## 2021-12-02 DIAGNOSIS — R262 Difficulty in walking, not elsewhere classified: Secondary | ICD-10-CM | POA: Diagnosis not present

## 2021-12-04 NOTE — Progress Notes (Signed)
Remote pacemaker transmission.   

## 2021-12-08 ENCOUNTER — Encounter: Payer: Self-pay | Admitting: Cardiology

## 2021-12-08 ENCOUNTER — Ambulatory Visit: Payer: Medicare HMO | Admitting: Cardiology

## 2021-12-08 VITALS — BP 100/62 | HR 60 | Ht 63.0 in | Wt 160.2 lb

## 2021-12-08 DIAGNOSIS — I10 Essential (primary) hypertension: Secondary | ICD-10-CM | POA: Diagnosis not present

## 2021-12-08 DIAGNOSIS — E7800 Pure hypercholesterolemia, unspecified: Secondary | ICD-10-CM

## 2021-12-08 DIAGNOSIS — R262 Difficulty in walking, not elsewhere classified: Secondary | ICD-10-CM | POA: Diagnosis not present

## 2021-12-08 DIAGNOSIS — I2510 Atherosclerotic heart disease of native coronary artery without angina pectoris: Secondary | ICD-10-CM | POA: Diagnosis not present

## 2021-12-08 DIAGNOSIS — I4819 Other persistent atrial fibrillation: Secondary | ICD-10-CM | POA: Diagnosis not present

## 2021-12-08 DIAGNOSIS — I495 Sick sinus syndrome: Secondary | ICD-10-CM

## 2021-12-08 DIAGNOSIS — M170 Bilateral primary osteoarthritis of knee: Secondary | ICD-10-CM | POA: Diagnosis not present

## 2021-12-08 NOTE — Addendum Note (Signed)
Addended by: Antonieta Iba on: 12/08/2021 09:46 AM ? ? Modules accepted: Orders ? ?

## 2021-12-08 NOTE — Patient Instructions (Signed)
Medication Instructions:  ?Your physician recommends that you continue on your current medications as directed. Please refer to the Current Medication list given to you today. ? ?*If you need a refill on your cardiac medications before your next appointment, please call your pharmacy* ? ? ?Lab Work: ?Fasting lipids, CMET, and CBC  ? ?If you have labs (blood work) drawn today and your tests are completely normal, you will receive your results only by: ?MyChart Message (if you have MyChart) OR ?A paper copy in the mail ?If you have any lab test that is abnormal or we need to change your treatment, we will call you to review the results. ? ? ?Follow-Up: ?At [facility], you and your health needs are our priority.  As part of our continuing mission to provide you with exceptional heart care, we have created designated Provider Care Teams.  These Care Teams include your primary Cardiologist (physician) and Advanced Practice Providers (APPs -  Physician Assistants and Nurse Practitioners) who all work together to provide you with the care you need, when you need it. ? ?Your next appointment:   ?1 year(s) ? ?The format for your next appointment:   ?In Person ? ?Provider:   ?Fransico Him, MD   ? ?Important Information About [place] ? ? ? ? ?  ?

## 2021-12-08 NOTE — Progress Notes (Signed)
?Cardiology Office Note:   ? ?Date:  12/08/2021  ? ?ID:  Cheryl [name], DOB [birth year], MRN [id number] ? ?PCP:  Aretta Nip, MD  ?Cardiologist:  Fransico Him, MD   ? ?Referring MD: Aretta Nip, MD  ? ?Chief Complaint  ?Patient presents with  ? Coronary Artery Disease  ? Hypertension  ? Hyperlipidemia  ? Atrial Fibrillation  ? ? ?History of Present Illness:   ? ?Cheryl [name] is a 76 y.o. female with a hx of PAF/flutter s/p ablation x 2, tachy/brady syndrome s/p PPM, HTN, ASCAD s/p PCI of OM and dyslipidemia.  She is here today for followup and is doing well.  She denies any chest pain or pressure, SOB, DOE, PND, orthopnea, dizziness, palpitations or syncope. Occasionally she will have some mild LE edema at the end of the day. She is compliant with her meds and is tolerating meds with no SE.    ? ?Past Medical History:  ?Diagnosis Date  ? Anemia   ? Arthritis   ? Blood transfusion ~ 1962  ? " couple; no reaction " (05/30/2012)  ? Coronary artery disease 2013  ? PCI PCI of OM  ? Depression   ? GERD (gastroesophageal reflux disease)   ? H/O hiatal hernia   ? HTN (hypertension)   ? Hypercholesteremia   ? Hypothyroidism   ? Myocardial infarction [facility])   ? "very very light" (05/30/2012)  ? PAF (paroxysmal atrial fibrillation) ([place]) 05/30/2012  ? s/p afib/flutter ablation 2013/2017  ? Tachy-brady syndrome ([place]) 10/2011  ? a. post-termination pauses in setting of PAF;  b. 10/28/11 - St. Jude Dual Chamber PPM SN [id number]   ? Ureteral obstruction, right   ? ? ?Past Surgical History:  ?Procedure Laterality Date  ? ATRIAL FIBRILLATION ABLATION N/A 05/30/2012  ? PVI and CTi ablation by Dr Rayann Heman  ? BREAST SURGERY  1990's  ? "right radial scar" (05/30/2012)  ? CORONARY ANGIOPLASTY  ~ 04/2012  ? CYSTOSCOPY WITH RETROGRADE PYELOGRAM, URETEROSCOPY AND STENT PLACEMENT  08/21/2012  ? Procedure: CYSTOSCOPY WITH RETROGRADE PYELOGRAM, URETEROSCOPY AND STENT PLACEMENT;  Surgeon: Franchot Gallo, MD;  Location: [facility] ORS;  Service:  Urology;  Laterality: Right;  URETEROSCOPY WITH BIOPSY ?  ? ELECTROPHYSIOLOGIC STUDY N/A 09/23/2015  ? Procedure: Atrial Fibrillation Ablation;  Surgeon: Thompson Grayer, MD;  Location: [facility] CV LAB;  Service: Cardiovascular;  Laterality: N/A;  ? LEFT HEART CATHETERIZATION WITH CORONARY ANGIOGRAM N/A 04/04/2012  ? Procedure: LEFT HEART CATHETERIZATION WITH CORONARY ANGIOGRAM;  Surgeon: Sinclair Grooms, MD;  Location: [facility] CATH LAB;  Service: Cardiovascular;  Laterality: N/A;  ? PERCUTANEOUS CORONARY INTERVENTION-BALLOON ONLY Right 04/04/2012  ? Procedure: PERCUTANEOUS CORONARY INTERVENTION-BALLOON ONLY;  Surgeon: Sinclair Grooms, MD;  Location: [facility] CATH LAB;  Service: Cardiovascular;  Laterality: Right;  ? PERMANENT PACEMAKER INSERTION N/A 10/28/2011  ? Procedure: PERMANENT PACEMAKER INSERTION;  Surgeon: [name] Lance, MD;  Location: [facility] CATH LAB;  Service: Cardiovascular;  Laterality: N/A;  ? TEE WITHOUT CARDIOVERSION  05/29/2012  ? Procedure: TRANSESOPHAGEAL ECHOCARDIOGRAM (TEE);  Surgeon: Sueanne Margarita, MD;  Location: [facility] ENDOSCOPY;  Service: Cardiovascular;  Laterality: N/A;  h/p in file drawer/dl  ? ? ?Current Medications: ?Current Meds  ?Medication Sig  ? ELIQUIS 5 MG TABS tablet TAKE 1 TABLET BY MOUTH TWICE DAILY  ? levothyroxine (SYNTHROID) 100 MCG tablet TAKE 1 TABLET BY MOUTH EVERY DAY BEFORE BREAKFAST  ? lisinopril (ZESTRIL) 10 MG tablet Take 1 tablet (10 mg total) by mouth daily.  ?  metoprolol succinate (TOPROL-XL) 50 MG 24 hr tablet Take with or immediately following a meal.  ? rosuvastatin (CRESTOR) 40 MG tablet Take 1 tablet (40 mg total) by mouth daily.  ?  ? ?Allergies:   Latex, Protonix [pantoprazole sodium], Codeine, and Tape  ? ?Social History  ? ?Socioeconomic History  ? Marital status: Married  ?  Spouse name: Not on file  ? Number of children: 4  ? Years of education: Not on file  ? Highest education level: Not on file  ?Occupational History  ? Not on file  ?Tobacco Use  ? Smoking status: Former   ?  Packs/day: 0.12  ?  Years: 15.00  ?  Pack years: 1.80  ?  Types: Cigarettes  ?  Quit date: 06/17/1977  ?  Years since quitting: 44.5  ? Smokeless tobacco: Never  ? Tobacco comments:  ?  QUIT smoking cigarettes 1978  ?Vaping Use  ? Vaping Use: Never used  ?Substance and Sexual Activity  ? Alcohol use: No  ? Drug use: No  ? Sexual activity: Never  ?  Birth control/protection: Post-menopausal  ?Other Topics Concern  ? Not on file  ?Social History Narrative  ? Lives with husband.  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  ? ?Social Determinants of Health  ? ?Financial Resource Strain: Not on file  ?Food Insecurity: Not on file  ?Transportation Needs: Not on file  ?Physical Activity: Not on file  ?Stress: Not on file  ?Social Connections: Not on file  ?  ? ?Family History: ?The patient's family history includes Breast cancer in her mother; Heart attack (age of onset: 90) in her father; Heart attack (age of onset: 72) in her mother; Pancreatic cancer in her mother; Uterine cancer in her mother. ? ?ROS:   ?Please see the history of present illness.    ?Review of Systems  ?Musculoskeletal:  Negative for muscle weakness.   ?All other systems reviewed and negative.  ? ?EKGs/Labs/Other Studies Reviewed:   ? ?The following studies were reviewed today: ?PAP compliance download ? ?EKG:  EKG is not ordered today  ? ?Recent Labs: ?12/17/2020: ALT 10; BUN 12; Creatinine, Ser 0.84; Hemoglobin 13.4; Platelets 281; Potassium 4.0; Sodium 140  ? ?Recent Lipid Panel ?   ?Component Value Date/Time  ? CHOL 142 12/17/2020 1127  ? TRIG 83 12/17/2020 1127  ? HDL 66 12/17/2020 1127  ? CHOLHDL 2.2 12/17/2020 1127  ? CHOLHDL 2.0 05/21/2016 1100  ? VLDL 12 05/21/2016 1100  ? [place] 60 12/17/2020 1127  ? ? ?Physical Exam:   ? ?VS:  BP 100/62   Pulse 60   Ht '5\' 3"'$  (1.6 m)   Wt 160 lb 3.2 oz (72.7 kg)   SpO2 97%   BMI 28.38 kg/m?    ? ?Wt Readings from Last 3 Encounters:  ?12/08/21 160 lb 3.2 oz (72.7 kg)  ?05/25/21 164 lb 6.4 oz (74.6 kg)   ?12/17/20 164 lb (74.4 kg)  ?  ?GEN: Well nourished, well developed in no acute distress ?HEENT: Normal ?NECK: No JVD; No carotid bruits ?LYMPHATICS: No lymphadenopathy ?CARDIAC:RRR, no murmurs, rubs, gallops ?RESPIRATORY:  Clear to auscultation without rales, wheezing or rhonchi  ?ABDOMEN: Soft, non-tender, non-distended ?MUSCULOSKELETAL:  No edema; No deformity  ?SKIN: Warm and dry ?NEUROLOGIC:  Alert and oriented x 3 ?PSYCHIATRIC:  Normal affect   ?ASSESSMENT:   ? ?1. Coronary artery disease involving native coronary artery of native heart without angina pectoris   ?2. Persistent atrial fibrillation ([place])   ?  3. Hypercholesteremia   ?4. Primary hypertension   ?5. Tachy-brady syndrome ([place])   ? ?PLAN:   ? ?In order of problems listed above: ? ?1.  ASCAD  ?- s/p cath in 2013 with  PCI of OM.  ?-She denies any anginal symptoms since I saw her last ?-Continue prescription drug management with Toprol-XL 50 mg daily, Crestor to 40 mg daily ?-She is not on aspirin due to [place] therapy ?  ?2.  Persistent atrial fibrillation  ?-s/p ablation x 2 ?-She continues to maintain normal sinus rhythm and denies any palpitations ?-She denies any bleeding issues on DOAC ?-Continue prescription drug management with Toprol-XL 50 mg daily and Eliquis 5 mg twice daily with as needed refills ?-Check Bmet and CBC today ? ?3.  Hyperlipidemia  ?- her LDL goal is < 70.   ?-Continue prescription drug management with Crestor 40 mg daily with as needed refills ?-Check FLP and ALT ?  ?4.  Hypertension  ?-BP is well controlled on exam today ?-Continue prescription drug management lisinopril 10 mg daily and Toprol-XL 50 mg daily with as needed refills ? ?5.  Tachy-Brady syndrome  ?- she is s/p PPM which is followed in our device clinic.  ? ? ?Medication Adjustments/Labs and Tests Ordered: ?Current medicines are reviewed at length with the patient today.  Concerns regarding medicines are outlined above.  ?No orders of the defined types were placed  in this encounter. ? ?No orders of the defined types were placed in this encounter. ? ? ?Signed, ?Fransico Him, MD  ?12/08/2021 9:33 AM    ?[place] ?

## 2021-12-10 DIAGNOSIS — R262 Difficulty in walking, not elsewhere classified: Secondary | ICD-10-CM | POA: Diagnosis not present

## 2021-12-10 DIAGNOSIS — M170 Bilateral primary osteoarthritis of knee: Secondary | ICD-10-CM | POA: Diagnosis not present

## 2021-12-14 ENCOUNTER — Other Ambulatory Visit: Payer: Medicare HMO

## 2021-12-16 DIAGNOSIS — J0100 Acute maxillary sinusitis, unspecified: Secondary | ICD-10-CM | POA: Diagnosis not present

## 2021-12-17 DIAGNOSIS — R262 Difficulty in walking, not elsewhere classified: Secondary | ICD-10-CM | POA: Diagnosis not present

## 2021-12-17 DIAGNOSIS — M170 Bilateral primary osteoarthritis of knee: Secondary | ICD-10-CM | POA: Diagnosis not present

## 2021-12-21 ENCOUNTER — Other Ambulatory Visit: Payer: Self-pay | Admitting: Cardiology

## 2021-12-21 ENCOUNTER — Ambulatory Visit (INDEPENDENT_AMBULATORY_CARE_PROVIDER_SITE_OTHER): Payer: Medicare HMO

## 2021-12-21 DIAGNOSIS — R262 Difficulty in walking, not elsewhere classified: Secondary | ICD-10-CM | POA: Diagnosis not present

## 2021-12-21 DIAGNOSIS — M170 Bilateral primary osteoarthritis of knee: Secondary | ICD-10-CM | POA: Diagnosis not present

## 2021-12-21 DIAGNOSIS — I119 Hypertensive heart disease without heart failure: Secondary | ICD-10-CM

## 2021-12-21 DIAGNOSIS — I495 Sick sinus syndrome: Secondary | ICD-10-CM

## 2021-12-21 NOTE — Telephone Encounter (Signed)
Prescription refill request for Eliquis received. Indication: PAF Last office visit: 12/08/21  Ashok Norris MD Scr: 0.84 on 12/17/20 Age:  76 Weight: 72.7kg  Based on above findings Eliquis '5mg'$  twice daily is the appropriate dose.  Pt is pending lab work.  Orders given to pt at last office visit.  Refill approved x 1 pending labs.

## 2021-12-22 LAB — CUP PACEART REMOTE DEVICE CHECK
Battery Remaining Longevity: 3 mo
Battery Remaining Percentage: 3 %
Battery Voltage: 2.69 V
Brady Statistic AP VP Percent: 1 %
Brady Statistic AP VS Percent: 84 %
Brady Statistic AS VP Percent: 1 %
Brady Statistic AS VS Percent: 16 %
Brady Statistic RA Percent Paced: 83 %
Brady Statistic RV Percent Paced: 1 %
Date Time Interrogation Session: 20230522021054
Implantable Lead Implant Date: 20130328
Implantable Lead Implant Date: 20130328
Implantable Lead Location: 753859
Implantable Lead Location: 753860
Implantable Pulse Generator Implant Date: 20130328
Lead Channel Impedance Value: 350 Ohm
Lead Channel Impedance Value: 400 Ohm
Lead Channel Pacing Threshold Amplitude: 0.875 V
Lead Channel Pacing Threshold Amplitude: 1.25 V
Lead Channel Pacing Threshold Pulse Width: 0.5 ms
Lead Channel Pacing Threshold Pulse Width: 0.7 ms
Lead Channel Sensing Intrinsic Amplitude: 3.3 mV
Lead Channel Sensing Intrinsic Amplitude: 7.9 mV
Lead Channel Setting Pacing Amplitude: 1.875
Lead Channel Setting Pacing Amplitude: 2.5 V
Lead Channel Setting Pacing Pulse Width: 0.7 ms
Lead Channel Setting Sensing Sensitivity: 2 mV
Pulse Gen Model: 2210
Pulse Gen Serial Number: 7334985

## 2021-12-24 DIAGNOSIS — M170 Bilateral primary osteoarthritis of knee: Secondary | ICD-10-CM | POA: Diagnosis not present

## 2021-12-24 DIAGNOSIS — R262 Difficulty in walking, not elsewhere classified: Secondary | ICD-10-CM | POA: Diagnosis not present

## 2022-01-04 ENCOUNTER — Other Ambulatory Visit: Payer: Medicare HMO

## 2022-01-04 DIAGNOSIS — I2510 Atherosclerotic heart disease of native coronary artery without angina pectoris: Secondary | ICD-10-CM | POA: Diagnosis not present

## 2022-01-04 DIAGNOSIS — I495 Sick sinus syndrome: Secondary | ICD-10-CM | POA: Diagnosis not present

## 2022-01-04 DIAGNOSIS — I10 Essential (primary) hypertension: Secondary | ICD-10-CM | POA: Diagnosis not present

## 2022-01-04 DIAGNOSIS — E7800 Pure hypercholesterolemia, unspecified: Secondary | ICD-10-CM | POA: Diagnosis not present

## 2022-01-04 DIAGNOSIS — I4819 Other persistent atrial fibrillation: Secondary | ICD-10-CM

## 2022-01-04 LAB — COMPREHENSIVE METABOLIC PANEL
ALT: 10 IU/L (ref 0–32)
AST: 17 IU/L (ref 0–40)
Albumin/Globulin Ratio: 1.6 (ref 1.2–2.2)
Albumin: 4.3 g/dL (ref 3.7–4.7)
Alkaline Phosphatase: 24 IU/L — ABNORMAL LOW (ref 44–121)
BUN/Creatinine Ratio: 14 (ref 12–28)
BUN: 11 mg/dL (ref 8–27)
Bilirubin Total: 0.6 mg/dL (ref 0.0–1.2)
CO2: 27 mmol/L (ref 20–29)
Calcium: 9.6 mg/dL (ref 8.7–10.3)
Chloride: 100 mmol/L (ref 96–106)
Creatinine, Ser: 0.79 mg/dL (ref 0.57–1.00)
Globulin, Total: 2.7 g/dL (ref 1.5–4.5)
Glucose: 88 mg/dL (ref 70–99)
Potassium: 4.2 mmol/L (ref 3.5–5.2)
Sodium: 139 mmol/L (ref 134–144)
Total Protein: 7 g/dL (ref 6.0–8.5)
eGFR: 78 mL/min/{1.73_m2} (ref >59)

## 2022-01-04 LAB — CBC
Hematocrit: 39.9 % (ref 34.0–46.6)
Hemoglobin: 13.3 g/dL (ref 11.1–15.9)
MCH: 27.7 pg (ref 26.6–33.0)
MCHC: 33.3 g/dL (ref 31.5–35.7)
MCV: 83 fL (ref 79–97)
Platelets: 297 10*3/uL (ref 150–450)
RBC: 4.8 x10E6/uL (ref 3.77–5.28)
RDW: 13.1 % (ref 11.7–15.4)
WBC: 5.4 10*3/uL (ref 3.4–10.8)

## 2022-01-04 LAB — LIPID PANEL
Chol/HDL Ratio: 2.1 ratio (ref 0.0–4.4)
Cholesterol, Total: 142 mg/dL (ref 100–199)
HDL: 68 mg/dL
LDL Chol Calc (NIH): 58 mg/dL (ref 0–99)
Triglycerides: 86 mg/dL (ref 0–149)
VLDL Cholesterol Cal: 16 mg/dL (ref 5–40)

## 2022-01-05 DIAGNOSIS — R262 Difficulty in walking, not elsewhere classified: Secondary | ICD-10-CM | POA: Diagnosis not present

## 2022-01-05 DIAGNOSIS — M170 Bilateral primary osteoarthritis of knee: Secondary | ICD-10-CM | POA: Diagnosis not present

## 2022-01-05 NOTE — Addendum Note (Signed)
Addended by: Douglass Rivers D on: 01/05/2022 10:56 AM   Modules accepted: Level of Service

## 2022-01-05 NOTE — Progress Notes (Signed)
Remote pacemaker transmission.   

## 2022-01-08 ENCOUNTER — Telehealth: Payer: Self-pay | Admitting: Cardiology

## 2022-01-08 MED ORDER — ROSUVASTATIN CALCIUM 40 MG PO TABS: 40.0000 mg | ORAL_TABLET | Freq: Every day | ORAL | 3 refills | Status: DC

## 2022-01-08 NOTE — Telephone Encounter (Signed)
Pt's medication was sent to pt's pharmacy as requested. Confirmation received.  °

## 2022-01-08 NOTE — Telephone Encounter (Signed)
*  STAT* If patient is at the pharmacy, call can be transferred to refill team.   1. Which medications need to be refilled? (please list name of each medication and dose if known)   rosuvastatin (CRESTOR) 40 MG tablet Take 1 tablet (40 mg total) by mouth daily.      2. Which pharmacy/location (including street and city if local pharmacy) is medication to be sent to?[facility] [place], [place]  3. Do they need a 30 day or 90 day supply?  30 day

## 2022-01-13 DIAGNOSIS — M170 Bilateral primary osteoarthritis of knee: Secondary | ICD-10-CM | POA: Diagnosis not present

## 2022-01-13 DIAGNOSIS — R262 Difficulty in walking, not elsewhere classified: Secondary | ICD-10-CM | POA: Diagnosis not present

## 2022-01-21 ENCOUNTER — Ambulatory Visit (INDEPENDENT_AMBULATORY_CARE_PROVIDER_SITE_OTHER): Payer: Self-pay

## 2022-01-21 DIAGNOSIS — I495 Sick sinus syndrome: Secondary | ICD-10-CM

## 2022-01-21 LAB — CUP PACEART REMOTE DEVICE CHECK
Battery Remaining Longevity: 3 mo
Battery Remaining Percentage: 3 %
Battery Voltage: 2.69 V
Brady Statistic AP VP Percent: 1 %
Brady Statistic AP VS Percent: 84 %
Brady Statistic AS VP Percent: 1 %
Brady Statistic AS VS Percent: 16 %
Brady Statistic RA Percent Paced: 83 %
Brady Statistic RV Percent Paced: 1 %
Date Time Interrogation Session: 20230622022154
Implantable Lead Implant Date: 20130328
Implantable Lead Implant Date: 20130328
Implantable Lead Location: 753859
Implantable Lead Location: 753860
Implantable Pulse Generator Implant Date: 20130328
Lead Channel Impedance Value: 350 Ohm
Lead Channel Impedance Value: 360 Ohm
Lead Channel Pacing Threshold Amplitude: 0.875 V
Lead Channel Pacing Threshold Amplitude: 1.25 V
Lead Channel Pacing Threshold Pulse Width: 0.5 ms
Lead Channel Pacing Threshold Pulse Width: 0.7 ms
Lead Channel Sensing Intrinsic Amplitude: 2.3 mV
Lead Channel Sensing Intrinsic Amplitude: 6.8 mV
Lead Channel Setting Pacing Amplitude: 1.875
Lead Channel Setting Pacing Amplitude: 2.5 V
Lead Channel Setting Pacing Pulse Width: 0.7 ms
Lead Channel Setting Sensing Sensitivity: 2 mV
Pulse Gen Model: 2210
Pulse Gen Serial Number: 7334985

## 2022-01-23 ENCOUNTER — Other Ambulatory Visit: Payer: Self-pay | Admitting: Cardiology

## 2022-01-23 DIAGNOSIS — I4819 Other persistent atrial fibrillation: Secondary | ICD-10-CM

## 2022-01-29 NOTE — Progress Notes (Signed)
Remote pacemaker transmission.   

## 2022-02-22 ENCOUNTER — Ambulatory Visit (INDEPENDENT_AMBULATORY_CARE_PROVIDER_SITE_OTHER): Payer: Medicare HMO

## 2022-02-22 DIAGNOSIS — I495 Sick sinus syndrome: Secondary | ICD-10-CM | POA: Diagnosis not present

## 2022-02-23 ENCOUNTER — Telehealth: Payer: Self-pay | Admitting: Cardiology

## 2022-02-23 LAB — CUP PACEART REMOTE DEVICE CHECK
Battery Remaining Longevity: 1 mo
Battery Remaining Percentage: 2 %
Battery Voltage: 2.66 V
Brady Statistic AP VP Percent: 1 %
Brady Statistic AP VS Percent: 83 %
Brady Statistic AS VP Percent: 1 %
Brady Statistic AS VS Percent: 17 %
Brady Statistic RA Percent Paced: 82 %
Brady Statistic RV Percent Paced: 1 %
Date Time Interrogation Session: 20230723023253
Implantable Lead Implant Date: 20130328
Implantable Lead Implant Date: 20130328
Implantable Lead Location: 753859
Implantable Lead Location: 753860
Implantable Pulse Generator Implant Date: 20130328
Lead Channel Impedance Value: 380 Ohm
Lead Channel Impedance Value: 380 Ohm
Lead Channel Pacing Threshold Amplitude: 0.75 V
Lead Channel Pacing Threshold Amplitude: 1.25 V
Lead Channel Pacing Threshold Pulse Width: 0.5 ms
Lead Channel Pacing Threshold Pulse Width: 0.7 ms
Lead Channel Sensing Intrinsic Amplitude: 3.3 mV
Lead Channel Sensing Intrinsic Amplitude: 7.9 mV
Lead Channel Setting Pacing Amplitude: 1.75 V
Lead Channel Setting Pacing Amplitude: 2.5 V
Lead Channel Setting Pacing Pulse Width: 0.7 ms
Lead Channel Setting Sensing Sensitivity: 2 mV
Pulse Gen Model: 2210
Pulse Gen Serial Number: 7334985

## 2022-02-23 NOTE — Telephone Encounter (Signed)
Patient called stating she received a transmission showing - "Time remaining on battery is one month".  Patient wants to know what are her next steps.

## 2022-02-23 NOTE — Telephone Encounter (Signed)
Discussed with patient that her device estimates she has an additional 0-3 months estimate on device. Once device reaches ERI, she will have an additional 3 months and in that time she will have her device changed out. Voiced understanding and appreciative of clarification.

## 2022-03-23 NOTE — Progress Notes (Signed)
Remote pacemaker transmission.   

## 2022-03-25 ENCOUNTER — Ambulatory Visit: Payer: Medicare HMO

## 2022-03-25 DIAGNOSIS — I495 Sick sinus syndrome: Secondary | ICD-10-CM

## 2022-03-25 LAB — CUP PACEART REMOTE DEVICE CHECK
Battery Remaining Longevity: 1 mo
Battery Remaining Percentage: 2 %
Battery Voltage: 2.66 V
Brady Statistic AP VP Percent: 1 %
Brady Statistic AP VS Percent: 83 %
Brady Statistic AS VP Percent: 1 %
Brady Statistic AS VS Percent: 17 %
Brady Statistic RA Percent Paced: 82 %
Brady Statistic RV Percent Paced: 1 %
Date Time Interrogation Session: 20230823024350
Implantable Lead Implant Date: 20130328
Implantable Lead Implant Date: 20130328
Implantable Lead Location: 753859
Implantable Lead Location: 753860
Implantable Pulse Generator Implant Date: 20130328
Lead Channel Impedance Value: 380 Ohm
Lead Channel Impedance Value: 380 Ohm
Lead Channel Pacing Threshold Amplitude: 0.875 V
Lead Channel Pacing Threshold Amplitude: 1.25 V
Lead Channel Pacing Threshold Pulse Width: 0.5 ms
Lead Channel Pacing Threshold Pulse Width: 0.7 ms
Lead Channel Sensing Intrinsic Amplitude: 1.9 mV
Lead Channel Sensing Intrinsic Amplitude: 8.2 mV
Lead Channel Setting Pacing Amplitude: 1.875
Lead Channel Setting Pacing Amplitude: 2.5 V
Lead Channel Setting Pacing Pulse Width: 0.7 ms
Lead Channel Setting Sensing Sensitivity: 2 mV
Pulse Gen Model: 2210
Pulse Gen Serial Number: 7334985

## 2022-04-20 NOTE — Progress Notes (Signed)
Remote pacemaker transmission.   

## 2022-04-26 ENCOUNTER — Ambulatory Visit (INDEPENDENT_AMBULATORY_CARE_PROVIDER_SITE_OTHER): Payer: Medicare HMO

## 2022-04-26 DIAGNOSIS — I495 Sick sinus syndrome: Secondary | ICD-10-CM

## 2022-04-28 LAB — CUP PACEART REMOTE DEVICE CHECK
Battery Remaining Longevity: 1 mo
Battery Remaining Percentage: 1 %
Battery Voltage: 2.63 V
Brady Statistic AP VP Percent: 1 %
Brady Statistic AP VS Percent: 83 %
Brady Statistic AS VP Percent: 1 %
Brady Statistic AS VS Percent: 17 %
Brady Statistic RA Percent Paced: 82 %
Brady Statistic RV Percent Paced: 1 %
Date Time Interrogation Session: 20230923025451
Implantable Lead Implant Date: 20130328
Implantable Lead Implant Date: 20130328
Implantable Lead Location: 753859
Implantable Lead Location: 753860
Implantable Pulse Generator Implant Date: 20130328
Lead Channel Impedance Value: 360 Ohm
Lead Channel Impedance Value: 400 Ohm
Lead Channel Pacing Threshold Amplitude: 0.875 V
Lead Channel Pacing Threshold Amplitude: 1.25 V
Lead Channel Pacing Threshold Pulse Width: 0.5 ms
Lead Channel Pacing Threshold Pulse Width: 0.7 ms
Lead Channel Sensing Intrinsic Amplitude: 3.1 mV
Lead Channel Sensing Intrinsic Amplitude: 7.7 mV
Lead Channel Setting Pacing Amplitude: 1.875
Lead Channel Setting Pacing Amplitude: 2.5 V
Lead Channel Setting Pacing Pulse Width: 0.7 ms
Lead Channel Setting Sensing Sensitivity: 2 mV
Pulse Gen Model: 2210
Pulse Gen Serial Number: 7334985

## 2022-05-14 NOTE — Progress Notes (Signed)
Remote pacemaker transmission.   

## 2022-05-14 NOTE — Addendum Note (Signed)
Addended by: Douglass Rivers D on: 05/14/2022 09:55 AM   Modules accepted: Level of Service

## 2022-05-26 LAB — CUP PACEART REMOTE DEVICE CHECK
Battery Remaining Longevity: 1 mo
Battery Remaining Percentage: 0.5 %
Battery Voltage: 2.62 V
Brady Statistic AP VP Percent: 1 %
Brady Statistic AP VS Percent: 83 %
Brady Statistic AS VP Percent: 1 %
Brady Statistic AS VS Percent: 17 %
Brady Statistic RA Percent Paced: 82 %
Brady Statistic RV Percent Paced: 1 %
Date Time Interrogation Session: 20231024030549
Implantable Lead Connection Status: 753985
Implantable Lead Connection Status: 753985
Implantable Lead Implant Date: 20130328
Implantable Lead Implant Date: 20130328
Implantable Lead Location: 753859
Implantable Lead Location: 753860
Implantable Pulse Generator Implant Date: 20130328
Lead Channel Impedance Value: 380 Ohm
Lead Channel Impedance Value: 400 Ohm
Lead Channel Pacing Threshold Amplitude: 0.875 V
Lead Channel Pacing Threshold Amplitude: 1.25 V
Lead Channel Pacing Threshold Pulse Width: 0.5 ms
Lead Channel Pacing Threshold Pulse Width: 0.7 ms
Lead Channel Sensing Intrinsic Amplitude: 2.1 mV
Lead Channel Sensing Intrinsic Amplitude: 7.8 mV
Lead Channel Setting Pacing Amplitude: 1.875
Lead Channel Setting Pacing Amplitude: 2.5 V
Lead Channel Setting Pacing Pulse Width: 0.7 ms
Lead Channel Setting Sensing Sensitivity: 2 mV
Pulse Gen Model: 2210
Pulse Gen Serial Number: 7334985

## 2022-05-27 ENCOUNTER — Ambulatory Visit (INDEPENDENT_AMBULATORY_CARE_PROVIDER_SITE_OTHER): Payer: Medicare HMO

## 2022-05-27 DIAGNOSIS — I495 Sick sinus syndrome: Secondary | ICD-10-CM | POA: Diagnosis not present

## 2022-06-03 NOTE — Progress Notes (Signed)
Remote pacemaker transmission.   

## 2022-06-09 DIAGNOSIS — E785 Hyperlipidemia, unspecified: Secondary | ICD-10-CM | POA: Diagnosis not present

## 2022-06-09 DIAGNOSIS — Z7901 Long term (current) use of anticoagulants: Secondary | ICD-10-CM | POA: Diagnosis not present

## 2022-06-09 DIAGNOSIS — Z950 Presence of cardiac pacemaker: Secondary | ICD-10-CM | POA: Diagnosis not present

## 2022-06-09 DIAGNOSIS — E039 Hypothyroidism, unspecified: Secondary | ICD-10-CM | POA: Diagnosis not present

## 2022-06-09 DIAGNOSIS — E559 Vitamin D deficiency, unspecified: Secondary | ICD-10-CM | POA: Diagnosis not present

## 2022-06-09 DIAGNOSIS — R200 Anesthesia of skin: Secondary | ICD-10-CM | POA: Diagnosis not present

## 2022-06-09 DIAGNOSIS — I10 Essential (primary) hypertension: Secondary | ICD-10-CM | POA: Diagnosis not present

## 2022-06-09 DIAGNOSIS — Z0000 Encounter for general adult medical examination without abnormal findings: Secondary | ICD-10-CM | POA: Diagnosis not present

## 2022-06-09 DIAGNOSIS — I4891 Unspecified atrial fibrillation: Secondary | ICD-10-CM | POA: Diagnosis not present

## 2022-06-28 ENCOUNTER — Ambulatory Visit (INDEPENDENT_AMBULATORY_CARE_PROVIDER_SITE_OTHER): Payer: Medicare HMO

## 2022-06-28 DIAGNOSIS — I495 Sick sinus syndrome: Secondary | ICD-10-CM

## 2022-06-29 ENCOUNTER — Telehealth: Payer: Self-pay

## 2022-06-29 LAB — CUP PACEART REMOTE DEVICE CHECK
Battery Remaining Longevity: 1 mo
Battery Remaining Percentage: 0.5 %
Battery Voltage: 2.59 V
Brady Statistic AP VP Percent: 1 %
Brady Statistic AP VS Percent: 83 %
Brady Statistic AS VP Percent: 1 %
Brady Statistic AS VS Percent: 17 %
Brady Statistic RA Percent Paced: 82 %
Brady Statistic RV Percent Paced: 1 %
Date Time Interrogation Session: 20231127021634
Implantable Lead Connection Status: 753985
Implantable Lead Connection Status: 753985
Implantable Lead Implant Date: 20130328
Implantable Lead Implant Date: 20130328
Implantable Lead Location: 753859
Implantable Lead Location: 753860
Implantable Pulse Generator Implant Date: 20130328
Lead Channel Impedance Value: 340 Ohm
Lead Channel Impedance Value: 350 Ohm
Lead Channel Pacing Threshold Amplitude: 0.875 V
Lead Channel Pacing Threshold Amplitude: 1.25 V
Lead Channel Pacing Threshold Pulse Width: 0.5 ms
Lead Channel Pacing Threshold Pulse Width: 0.7 ms
Lead Channel Sensing Intrinsic Amplitude: 1.9 mV
Lead Channel Sensing Intrinsic Amplitude: 8.5 mV
Lead Channel Setting Pacing Amplitude: 1.875
Lead Channel Setting Pacing Amplitude: 2.5 V
Lead Channel Setting Pacing Pulse Width: 0.7 ms
Lead Channel Setting Sensing Sensitivity: 2 mV
Pulse Gen Model: 2210
Pulse Gen Serial Number: 7334985

## 2022-06-29 NOTE — Telephone Encounter (Signed)
Scheduled remote reviewed. Normal device function.   ERI reached per voltage 2.59V, ERI 2.60 - route to triage Next remote to be determined LA

## 2022-06-30 NOTE — Telephone Encounter (Signed)
Spoke with patient and let her know her device is right at ERI.   Patient needs her appointment moved up with Lovena Le at end of January and rescheduled for December to review for a gen change. Batter is at [facility].   Patient aware that Doylene Canning will call her back to schedule.

## 2022-07-12 ENCOUNTER — Other Ambulatory Visit: Payer: Self-pay | Admitting: Cardiology

## 2022-07-12 DIAGNOSIS — I4819 Other persistent atrial fibrillation: Secondary | ICD-10-CM

## 2022-07-12 NOTE — Telephone Encounter (Signed)
Eliquis '5mg'$  refill request received. Patient is 76 years old, weight-72.7kg, Crea-0.79 on 01/04/2022, Diagnosis-Afib, and last seen by Dr. Radford Pax on 12/08/2021. Dose is appropriate based on dosing criteria. Will send in refill to requested pharmacy.

## 2022-07-15 ENCOUNTER — Encounter: Payer: Self-pay | Admitting: Internal Medicine

## 2022-07-15 ENCOUNTER — Ambulatory Visit: Payer: Medicare HMO | Attending: Internal Medicine | Admitting: Internal Medicine

## 2022-07-15 ENCOUNTER — Encounter: Payer: Self-pay | Admitting: *Deleted

## 2022-07-15 VITALS — BP 124/70 | HR 65 | Ht 63.0 in | Wt 161.0 lb

## 2022-07-15 DIAGNOSIS — Z01812 Encounter for preprocedural laboratory examination: Secondary | ICD-10-CM

## 2022-07-15 NOTE — Progress Notes (Signed)
HPI Ms. Grall returns today after a long absence from our EP clinic. She is a pleasant 76 yo woman with a h/o symptomatic sinus node dysfunction, PAF, s/p ablation X 2. She had been followed by Dr. Rayann Heman after I have placed her PPM in about 10 years ago. She has done well in the interim with no symptomatic atrial fib. She has reached ERI on her PPM. No other complaints today.  Allergies  Allergen Reactions   Latex Itching   Protonix [Pantoprazole Sodium] Other (See Comments)    "think it caused me to have a fib"   Codeine Nausea And Vomiting   Tape Rash    "Paper tape is ok"     Current Outpatient Medications  Medication Sig Dispense Refill   ELIQUIS 5 MG TABS tablet TAKE 1 TABLET BY MOUTH TWICE DAILY 60 tablet 5   levothyroxine (SYNTHROID) 100 MCG tablet TAKE 1 TABLET BY MOUTH EVERY DAY BEFORE BREAKFAST 90 tablet 2   lisinopril (ZESTRIL) 10 MG tablet TAKE 1 TABLET BY MOUTH DAILY 90 tablet 3   metoprolol succinate (TOPROL-XL) 50 MG 24 hr tablet TAKE 1 TABLET BY MOUTH DAILY WITH OR IMMEDIATELY FOLLOWING A MEAL 90 tablet 3   rosuvastatin (CRESTOR) 40 MG tablet Take 1 tablet (40 mg total) by mouth daily. 90 tablet 3   Vitamin D, Ergocalciferol, 50000 units CAPS Take 1 capsule by mouth once a week.     No current facility-administered medications for this visit.     Past Medical History:  Diagnosis Date   Anemia    Arthritis    Blood transfusion ~ 1962   " couple; no reaction " (05/30/2012)   Coronary artery disease 2013   PCI PCI of OM   Depression    GERD (gastroesophageal reflux disease)    H/O hiatal hernia    HTN (hypertension)    Hypercholesteremia    Hypothyroidism    Myocardial infarction [facility])    "very very light" (05/30/2012)   PAF (paroxysmal atrial fibrillation) ([place]) 05/30/2012   s/p afib/flutter ablation 2013/2017   Tachy-brady syndrome ([place]) 10/2011   a. post-termination pauses in setting of PAF;  b. 10/28/11 - St. Jude Dual Chamber PPM SN [id number]     Ureteral obstruction, right     ROS:   All systems reviewed and negative except as noted in the HPI.   Past Surgical History:  Procedure Laterality Date   ATRIAL FIBRILLATION ABLATION N/A 05/30/2012   PVI and CTi ablation by Dr Rayann Heman   BREAST SURGERY  1990's   "right radial scar" (05/30/2012)   CORONARY ANGIOPLASTY  ~ 04/2012   CYSTOSCOPY WITH RETROGRADE PYELOGRAM, URETEROSCOPY AND STENT PLACEMENT  08/21/2012   Procedure: CYSTOSCOPY WITH RETROGRADE PYELOGRAM, URETEROSCOPY AND STENT PLACEMENT;  Surgeon: Franchot Gallo, MD;  Location: [facility] ORS;  Service: Urology;  Laterality: Right;  URETEROSCOPY WITH BIOPSY    ELECTROPHYSIOLOGIC STUDY N/A 09/23/2015   Procedure: Atrial Fibrillation Ablation;  Surgeon: Thompson Grayer, MD;  Location: [place] CV LAB;  Service: Cardiovascular;  Laterality: N/A;   LEFT HEART CATHETERIZATION WITH CORONARY ANGIOGRAM N/A 04/04/2012   Procedure: LEFT HEART CATHETERIZATION WITH CORONARY ANGIOGRAM;  Surgeon: Sinclair Grooms, MD;  Location: [facility] CATH LAB;  Service: Cardiovascular;  Laterality: N/A;   PERCUTANEOUS CORONARY INTERVENTION-BALLOON ONLY Right 04/04/2012   Procedure: PERCUTANEOUS CORONARY INTERVENTION-BALLOON ONLY;  Surgeon: Sinclair Grooms, MD;  Location: [facility] CATH LAB;  Service: Cardiovascular;  Laterality: Right;   PERMANENT PACEMAKER INSERTION N/A  10/28/2011   Procedure: PERMANENT PACEMAKER INSERTION;  Surgeon: [name] Lance, MD;  Location: [facility] CATH LAB;  Service: Cardiovascular;  Laterality: N/A;   TEE WITHOUT CARDIOVERSION  05/29/2012   Procedure: TRANSESOPHAGEAL ECHOCARDIOGRAM (TEE);  Surgeon: Sueanne Margarita, MD;  Location: [facility] ENDOSCOPY;  Service: Cardiovascular;  Laterality: N/A;  h/p in file drawer/dl     Family History  Problem Relation Age of Onset   Heart attack Mother 62   Pancreatic cancer Mother    Uterine cancer Mother    Breast cancer Mother    Heart attack Father 64     Social History   Socioeconomic History   Marital status:  Married    Spouse name: Not on file   Number of children: 4   Years of education: Not on file   Highest education level: Not on file  Occupational History   Not on file  Tobacco Use   Smoking status: Former    Packs/day: 0.12    Years: 15.00    Total pack years: 1.80    Types: Cigarettes    Quit date: 06/17/1977    Years since quitting: 45.1   Smokeless tobacco: Never   Tobacco comments:    QUIT smoking cigarettes 1978  Vaping Use   Vaping Use: Never used  Substance and Sexual Activity   Alcohol use: No   Drug use: No   Sexual activity: Never    Birth control/protection: Post-menopausal  Other Topics Concern   Not on file  Social History Narrative   Lives with husband.  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   Social Determinants of Radio broadcast assistant Strain: Not on file  Food Insecurity: Not on file  Transportation Needs: Not on file  Physical Activity: Not on file  Stress: Not on file  Social Connections: Not on file  Intimate Partner Violence: Not on file     BP 124/70   Pulse 65   Ht '5\' 3"'$  (1.6 m)   Wt 161 lb (73 kg)   SpO2 99%   BMI 28.52 kg/m   Physical Exam:  Well appearing NAD HEENT: Unremarkable Neck:  No JVD, no thyromegally Lymphatics:  No adenopathy Back:  No CVA tenderness Lungs:  Clear with no wheezes HEART:  Regular rate rhythm, no murmurs, no rubs, no clicks Abd:  soft, positive bowel sounds, no organomegally, no rebound, no guarding Ext:  2 plus pulses, no edema, no cyanosis, no clubbing Skin:  No rashes no nodules Neuro:  CN II through XII intact, motor grossly intact  EKG - NSR with atrial pacing  DEVICE  Normal device function.  See PaceArt for details. ERI  Assess/Plan: Sinus node dysfunction - she is asymptomatic s/p PPM insertion. PPM - her device has reached ERI. I have discussed the indications/risks/benefits/goals/expectations of PPM gen change out and she wishes to proceed. PAF - she appears to be maintaining  NSR 98% of the time. HTN - her bp is well controlled. We will follow.  Carleene Overlie Delancey Moraes,MD

## 2022-07-15 NOTE — Patient Instructions (Addendum)
Medication Instructions:  NO CHANGES *If you need a refill on your cardiac medications before your next appointment, please call your pharmacy*   Lab Work: BMET AND CBC FIRST [place]  If you have labs (blood work) drawn today and your tests are completely normal, you will receive your results only by: [place] (if you have MyChart) OR A paper copy in the mail If you have any lab test that is abnormal or we need to change your treatment, we will call you to review the results.   Testing/Procedures: NONE   Follow-Up: At [facility], you and your health needs are our priority.  As part of our continuing mission to provide you with exceptional heart care, we have created designated Provider Care Teams.  These Care Teams include your primary Cardiologist (physician) and Advanced Practice Providers (APPs -  Physician Assistants and Nurse Practitioners) who all work together to provide you with the care you need, when you need it.  We recommend signing up for the patient portal called "MyChart".  Sign up information is provided on this After Visit Summary.  MyChart is used to connect with patients for Virtual Visits (Telemedicine).  Patients are able to view lab/test results, encounter notes, upcoming appointments, etc.  Non-urgent messages can be sent to your provider as well.   To learn more about what you can do with MyChart, go to [email or web address].    Your next appointment:WILL  BE SCHEDULED AFTER GENERATOR CHANGE  The format for your next appointment:     Provider:       Other Instructions   Important Information About [place]

## 2022-07-19 ENCOUNTER — Telehealth: Payer: Self-pay

## 2022-07-19 ENCOUNTER — Telehealth: Payer: Self-pay | Admitting: Internal Medicine

## 2022-07-19 NOTE — Telephone Encounter (Signed)
Pt would like a callback from nurse regarding procedure. Please advise

## 2022-07-19 NOTE — Telephone Encounter (Signed)
Pt was given procedure instructions when in the office. I called pt to inform them that our instructions have been updated and I am mailing them a new instruction letter.

## 2022-07-20 DIAGNOSIS — M170 Bilateral primary osteoarthritis of knee: Secondary | ICD-10-CM | POA: Diagnosis not present

## 2022-07-20 NOTE — Telephone Encounter (Signed)
Spoke with pt regarding her procedure instructions. She is aware that I have sent her updated instructions via MyChart and also I have mailed her a copy.   She is going to the Ortho Dr today and will be getting scheduled for a gel shot. She wanted to make sure it was ok. I advised her that if the Ortho Dr had any questions regarding the injection that they would need to send a surgical clearance request.

## 2022-07-23 ENCOUNTER — Encounter: Payer: Self-pay | Admitting: Internal Medicine

## 2022-07-29 ENCOUNTER — Ambulatory Visit (INDEPENDENT_AMBULATORY_CARE_PROVIDER_SITE_OTHER): Payer: Medicare HMO

## 2022-07-29 DIAGNOSIS — I495 Sick sinus syndrome: Secondary | ICD-10-CM

## 2022-07-29 LAB — CUP PACEART REMOTE DEVICE CHECK
Battery Remaining Longevity: 1 mo
Battery Remaining Percentage: 0.5 %
Battery Voltage: 2.57 V
Brady Statistic AP VP Percent: 1 %
Brady Statistic AP VS Percent: 83 %
Brady Statistic AS VP Percent: 1 %
Brady Statistic AS VS Percent: 17 %
Brady Statistic RA Percent Paced: 81 %
Brady Statistic RV Percent Paced: 1 %
Date Time Interrogation Session: 20231228022734
Implantable Lead Connection Status: 753985
Implantable Lead Connection Status: 753985
Implantable Lead Implant Date: 20130328
Implantable Lead Implant Date: 20130328
Implantable Lead Location: 753859
Implantable Lead Location: 753860
Implantable Pulse Generator Implant Date: 20130328
Lead Channel Impedance Value: 350 Ohm
Lead Channel Impedance Value: 360 Ohm
Lead Channel Pacing Threshold Amplitude: 0.75 V
Lead Channel Pacing Threshold Amplitude: 1.25 V
Lead Channel Pacing Threshold Pulse Width: 0.5 ms
Lead Channel Pacing Threshold Pulse Width: 0.7 ms
Lead Channel Sensing Intrinsic Amplitude: 2.6 mV
Lead Channel Sensing Intrinsic Amplitude: 7.4 mV
Lead Channel Setting Pacing Amplitude: 1.75 V
Lead Channel Setting Pacing Amplitude: 2.5 V
Lead Channel Setting Pacing Pulse Width: 0.7 ms
Lead Channel Setting Sensing Sensitivity: 2 mV
Pulse Gen Model: 2210
Pulse Gen Serial Number: 7334985

## 2022-08-03 ENCOUNTER — Ambulatory Visit: Payer: Medicare HMO | Attending: Internal Medicine

## 2022-08-03 DIAGNOSIS — Z01812 Encounter for preprocedural laboratory examination: Secondary | ICD-10-CM | POA: Diagnosis not present

## 2022-08-03 DIAGNOSIS — I495 Sick sinus syndrome: Secondary | ICD-10-CM | POA: Diagnosis not present

## 2022-08-04 ENCOUNTER — Other Ambulatory Visit: Payer: Medicare HMO

## 2022-08-04 LAB — CBC
Hematocrit: 43.2 % (ref 34.0–46.6)
Hemoglobin: 14.3 g/dL (ref 11.1–15.9)
MCH: 28.2 pg (ref 26.6–33.0)
MCHC: 33.1 g/dL (ref 31.5–35.7)
MCV: 85 fL (ref 79–97)
Platelets: 279 10*3/uL (ref 150–450)
RBC: 5.07 x10E6/uL (ref 3.77–5.28)
RDW: 12.8 % (ref 11.7–15.4)
WBC: 6.3 10*3/uL (ref 3.4–10.8)

## 2022-08-04 LAB — BASIC METABOLIC PANEL
BUN/Creatinine Ratio: 11 — ABNORMAL LOW (ref 12–28)
BUN: 10 mg/dL (ref 8–27)
CO2: 26 mmol/L (ref 20–29)
Calcium: 9.7 mg/dL (ref 8.7–10.3)
Chloride: 99 mmol/L (ref 96–106)
Creatinine, Ser: 0.93 mg/dL (ref 0.57–1.00)
Glucose: 115 mg/dL — ABNORMAL HIGH (ref 70–99)
Potassium: 4.1 mmol/L (ref 3.5–5.2)
Sodium: 141 mmol/L (ref 134–144)
eGFR: 64 mL/min/{1.73_m2} (ref >59)

## 2022-08-05 NOTE — Addendum Note (Signed)
Addended by: Douglass Rivers D on: 08/05/2022 04:43 PM   Modules accepted: Level of Service

## 2022-08-05 NOTE — Progress Notes (Signed)
Remote pacemaker transmission.   

## 2022-08-16 ENCOUNTER — Telehealth: Payer: Self-pay | Admitting: Cardiology

## 2022-08-16 NOTE — Pre-Procedure Instructions (Signed)
Instructed patient on the following items: Arrival time 1100 Nothing to eat or drink after midnight No meds AM of procedure Responsible person to drive you home and stay with you for 24 hrs Wash with special soap night before and morning of procedure If on anti-coagulant drug instructions Eliquis- last doses 08/14/22

## 2022-08-16 NOTE — Telephone Encounter (Signed)
Called pt in regards to taking Toprol XL and lisinopril the day of procedure.  Normally takes medications at 1:30 pm. Pt reports is anxious about procedure.  Advised pt that anxiety can raise BP.  Advised pt to take medications prior to procedure to prevent BP spike.  Pt expresses understanding no further questions or concerns voiced.

## 2022-08-16 NOTE — Progress Notes (Signed)
Remote pacemaker transmission.   

## 2022-08-16 NOTE — Telephone Encounter (Signed)
Pt c/o medication issue:  1. Name of Medication:   metoprolol succinate (TOPROL-XL) 50 MG 24 hr tablet  lisinopril (ZESTRIL) 10 MG tablet   2. How are you currently taking this medication (dosage and times per day)? As prescribed  3. Are you having a reaction (difficulty breathing--STAT)?   No  4. What is your medication issue?   Patient would like a call back regarding taking these medication earlier than normal due to her procedure.

## 2022-08-17 ENCOUNTER — Encounter (HOSPITAL_COMMUNITY): Payer: Self-pay | Admitting: Internal Medicine

## 2022-08-17 ENCOUNTER — Other Ambulatory Visit: Payer: Self-pay

## 2022-08-17 ENCOUNTER — Encounter (HOSPITAL_COMMUNITY)
Admission: RE | Disposition: A | Discharge status: Home / Self Care | Payer: Self-pay | Source: Home / Self Care | Attending: Internal Medicine

## 2022-08-17 ENCOUNTER — Ambulatory Visit (HOSPITAL_COMMUNITY)
Admission: RE | Admit: 2022-08-17 | Discharge: 2022-08-17 | Disposition: A | Discharge status: Home / Self Care | Payer: Medicare HMO | Attending: Internal Medicine | Admitting: Internal Medicine

## 2022-08-17 DIAGNOSIS — Z45010 Encounter for checking and testing of cardiac pacemaker pulse generator [battery]: Secondary | ICD-10-CM | POA: Diagnosis not present

## 2022-08-17 DIAGNOSIS — I495 Sick sinus syndrome: Secondary | ICD-10-CM | POA: Diagnosis not present

## 2022-08-17 DIAGNOSIS — Z87891 Personal history of nicotine dependence: Secondary | ICD-10-CM | POA: Diagnosis not present

## 2022-08-17 DIAGNOSIS — I480 Paroxysmal atrial fibrillation: Secondary | ICD-10-CM | POA: Insufficient documentation

## 2022-08-17 DIAGNOSIS — I10 Essential (primary) hypertension: Secondary | ICD-10-CM | POA: Diagnosis not present

## 2022-08-17 HISTORY — PX: PPM GENERATOR CHANGEOUT: EP1233

## 2022-08-17 SURGERY — PPM GENERATOR CHANGEOUT

## 2022-08-17 MED ORDER — CEFAZOLIN SODIUM-DEXTROSE 2-4 GM/100ML-% IV SOLN
INTRAVENOUS | Status: AC
  Filled 2022-08-17: qty 100

## 2022-08-17 MED ORDER — POVIDONE-IODINE 10 % EX SWAB
2.0000 | Freq: Once | CUTANEOUS | Status: AC
  Administered 2022-08-17: 2 via TOPICAL

## 2022-08-17 MED ORDER — ACETAMINOPHEN 325 MG PO TABS: 325.0000 mg | ORAL_TABLET | ORAL | Status: DC | PRN

## 2022-08-17 MED ORDER — SODIUM CHLORIDE 0.9 % IV SOLN: INTRAVENOUS | Status: DC

## 2022-08-17 MED ORDER — LIDOCAINE HCL (PF) 1 % IJ SOLN
INTRAMUSCULAR | Status: DC | PRN
  Administered 2022-08-17: 60 mL

## 2022-08-17 MED ORDER — SODIUM CHLORIDE 0.9 % IV SOLN
INTRAVENOUS | Status: AC
  Filled 2022-08-17: qty 2

## 2022-08-17 MED ORDER — CHLORHEXIDINE GLUCONATE 4 % EX LIQD: 4.0000 | Freq: Once | CUTANEOUS | Status: DC

## 2022-08-17 MED ORDER — FENTANYL CITRATE (PF) 100 MCG/2ML IJ SOLN
INTRAMUSCULAR | Status: DC | PRN
  Administered 2022-08-17: 12.5 ug via INTRAVENOUS

## 2022-08-17 MED ORDER — FENTANYL CITRATE (PF) 100 MCG/2ML IJ SOLN
INTRAMUSCULAR | Status: AC
  Filled 2022-08-17: qty 2

## 2022-08-17 MED ORDER — LIDOCAINE HCL (PF) 1 % IJ SOLN
INTRAMUSCULAR | Status: AC
  Filled 2022-08-17: qty 60

## 2022-08-17 MED ORDER — CEFAZOLIN SODIUM-DEXTROSE 2-4 GM/100ML-% IV SOLN
2.0000 g | INTRAVENOUS | Status: AC
  Administered 2022-08-17: 2 g via INTRAVENOUS

## 2022-08-17 MED ORDER — ONDANSETRON HCL 4 MG/2ML IJ SOLN: 4.0000 mg | Freq: Four times a day (QID) | INTRAMUSCULAR | Status: DC | PRN

## 2022-08-17 MED ORDER — MIDAZOLAM HCL 5 MG/5ML IJ SOLN
INTRAMUSCULAR | Status: DC | PRN
  Administered 2022-08-17: 1 mg via INTRAVENOUS

## 2022-08-17 MED ORDER — MIDAZOLAM HCL 5 MG/5ML IJ SOLN
INTRAMUSCULAR | Status: AC
  Filled 2022-08-17: qty 5

## 2022-08-17 MED ORDER — SODIUM CHLORIDE 0.9 % IV SOLN
80.0000 mg | INTRAVENOUS | Status: AC
  Administered 2022-08-17: 80 mg

## 2022-08-17 SURGICAL SUPPLY — 6 items
CABLE SURGICAL S-101-97-12 (CABLE) ×2 IMPLANT
PACEMAKER ASSURITY DR-RF (Pacemaker) IMPLANT
PAD DEFIB RADIO PHYSIO CONN (PAD) ×2 IMPLANT
POUCH AIGIS-R ANTIBACT PPM (Mesh General) ×1 IMPLANT
POUCH AIGIS-R ANTIBACT PPM MED (Mesh General) IMPLANT
TRAY PACEMAKER INSERTION (PACKS) ×2 IMPLANT

## 2022-08-17 NOTE — Discharge Instructions (Signed)
Implantable Cardiac Device Battery Change, Care After  This sheet gives you information about how to care for yourself after your procedure. Your health care provider may also give you more specific instructions. If you have problems or questions, contact your health care provider. What can I expect after the procedure? After your procedure, it is common to have:  Pain or soreness at the site where the cardiac device was inserted.  Swelling at the site where the cardiac device was inserted.  You should received an information card for your new device in 4-8 weeks. Follow these instructions at home: Incision care   Keep the incision clean and dry. ? Do not take baths, swim, or use a hot tub until after your wound check.  ? Do not shower for at least 7 days, or as directed by your health care provider. ? Pat the area dry with a clean towel. Do not rub the area. This may cause bleeding.  Follow instructions from your health care provider about how to take care of your incision. Make sure you: ? Leave stitches (sutures), skin glue, or adhesive strips in place. These skin closures may need to stay in place for 2 weeks or longer. If adhesive strip edges start to loosen and curl up, you may trim the loose edges. Do not remove adhesive strips completely unless your health care provider tells you to do that.  Check your incision area every day for signs of infection. Check for: ? More redness, swelling, or pain. ? More fluid or blood. ? Warmth. ? Pus or a bad smell. Activity  Do not lift anything that is heavier than 10 lb (4.5 kg) until your health care provider says it is okay to do so.  For the first week, or as long as told by your health care provider: ? Avoid lifting your affected arm higher than your shoulder. ? After 1 week, Be gentle when you move your arms over your head. It is okay to raise your arm to comb your hair. ? Avoid strenuous exercise.  Ask your health care provider  when it is okay to: ? Resume your normal activities. ? Return to work or school. ? Resume sexual activity. Eating and drinking  Eat a heart-healthy diet. This should include plenty of fresh fruits and vegetables, whole grains, low-fat dairy products, and lean protein like chicken and fish.  Limit alcohol intake to no more than 1 drink a day for non-pregnant women and 2 drinks a day for men. One drink equals 12 oz of beer, 5 oz of wine, or 1 oz of hard liquor.  Check ingredients and nutrition facts on packaged foods and beverages. Avoid the following types of food: ? Food that is high in salt (sodium). ? Food that is high in saturated fat, like full-fat dairy or red meat. ? Food that is high in trans fat, like fried food. ? Food and drinks that are high in sugar. Lifestyle  Do not use any products that contain nicotine or tobacco, such as cigarettes and e-cigarettes. If you need help quitting, ask your health care provider.  Take steps to manage and control your weight.  Once cleared, get regular exercise. Aim for 150 minutes of moderate-intensity exercise (such as walking or yoga) or 75 minutes of vigorous exercise (such as running or swimming) each week.  Manage other health problems, such as diabetes or high blood pressure. Ask your health care provider how you can manage these conditions. General instructions  Do   not drive for 24 hours after your procedure if you were given a medicine to help you relax (sedative).  Take over-the-counter and prescription medicines only as told by your health care provider.  Avoid putting pressure on the area where the cardiac device was placed.  If you need an MRI after your cardiac device has been placed, be sure to tell the health care provider who orders the MRI that you have a cardiac device.  Avoid close and prolonged exposure to electrical devices that have strong magnetic fields. These include: ? Cell phones. Avoid keeping them in a  pocket near the cardiac device, and try using the ear opposite the cardiac device. ? MP3 players. ? Household appliances, like microwaves. ? Metal detectors. ? Electric generators. ? High-tension wires.  Keep all follow-up visits as directed by your health care provider. This is important. Contact a health care provider if:  You have pain at the incision site that is not relieved by over-the-counter or prescription medicines.  You have any of these around your incision site or coming from it: ? More redness, swelling, or pain. ? Fluid or blood. ? Warmth to the touch. ? Pus or a bad smell.  You have a fever.  You feel brief, occasional palpitations, light-headedness, or any symptoms that you think might be related to your heart. Get help right away if:  You experience chest pain that is different from the pain at the cardiac device site.  You develop a red streak that extends above or below the incision site.  You experience shortness of breath.  You have palpitations or an irregular heartbeat.  You have light-headedness that does not go away quickly.  You faint or have dizzy spells.  Your pulse suddenly drops or increases rapidly and does not return to normal.  You begin to gain weight and your legs and ankles swell. Summary  After your procedure, it is common to have pain, soreness, and some swelling where the cardiac device was inserted.  Make sure to keep your incision clean and dry. Follow instructions from your health care provider about how to take care of your incision.  Check your incision every day for signs of infection, such as more pain or swelling, pus or a bad smell, warmth, or leaking fluid and blood.  Avoid strenuous exercise and lifting your left arm higher than your shoulder for 2 weeks, or as long as told by your health care provider. This information is not intended to replace advice given to you by your health care provider. Make sure you discuss  any questions you have with your health care provider.   

## 2022-08-17 NOTE — H&P (Signed)
HPI [name] returns today after a long absence from our EP clinic. She is a pleasant 77 yo woman with a h/o symptomatic sinus node dysfunction, PAF, s/p ablation X 2. She had been followed by Dr. Rayann Heman after I have placed her PPM in about 10 years ago. She has done well in the interim with no symptomatic atrial fib. She has reached ERI on her PPM. No other complaints today.       Allergies  Allergen Reactions   Latex Itching   Protonix [Pantoprazole Sodium] Other (See Comments)      "think it caused me to have a fib"   Codeine Nausea And Vomiting   Tape Rash      "Paper tape is ok"              Current Outpatient Medications  Medication Sig Dispense Refill   ELIQUIS 5 MG TABS tablet TAKE 1 TABLET BY MOUTH TWICE DAILY 60 tablet 5   levothyroxine (SYNTHROID) 100 MCG tablet TAKE 1 TABLET BY MOUTH EVERY DAY BEFORE BREAKFAST 90 tablet 2   lisinopril (ZESTRIL) 10 MG tablet TAKE 1 TABLET BY MOUTH DAILY 90 tablet 3   metoprolol succinate (TOPROL-XL) 50 MG 24 hr tablet TAKE 1 TABLET BY MOUTH DAILY WITH OR IMMEDIATELY FOLLOWING A MEAL 90 tablet 3   rosuvastatin (CRESTOR) 40 MG tablet Take 1 tablet (40 mg total) by mouth daily. 90 tablet 3   Vitamin D, Ergocalciferol, 50000 units CAPS Take 1 capsule by mouth once a week.        No current facility-administered medications for this visit.            Past Medical History:  Diagnosis Date   Anemia     Arthritis     Blood transfusion ~ 1962    " couple; no reaction " (05/30/2012)   Coronary artery disease 2013    PCI PCI of OM   Depression     GERD (gastroesophageal reflux disease)     H/O hiatal hernia     HTN (hypertension)     Hypercholesteremia     Hypothyroidism     Myocardial infarction [facility])      "very very light" (05/30/2012)   PAF (paroxysmal atrial fibrillation) ([place]) 05/30/2012    s/p afib/flutter ablation 2013/2017   Tachy-brady syndrome ([place]) 10/2011    a. post-termination pauses in setting of PAF;  b.  10/28/11 - [employer] Jude Dual Chamber PPM SN [id number]    Ureteral obstruction, right        ROS:    All systems reviewed and negative except as noted in the HPI.          Past Surgical History:  Procedure Laterality Date   ATRIAL FIBRILLATION ABLATION N/A 05/30/2012    PVI and CTi ablation by Dr Rayann Heman   BREAST SURGERY   1990's    "right radial scar" (05/30/2012)   CORONARY ANGIOPLASTY   ~ 04/2012   CYSTOSCOPY WITH RETROGRADE PYELOGRAM, URETEROSCOPY AND STENT PLACEMENT   08/21/2012    Procedure: CYSTOSCOPY WITH RETROGRADE PYELOGRAM, URETEROSCOPY AND STENT PLACEMENT;  Surgeon: Franchot Gallo, MD;  Location: [facility] ORS;  Service: Urology;  Laterality: Right;  URETEROSCOPY WITH BIOPSY     ELECTROPHYSIOLOGIC STUDY N/A 09/23/2015    Procedure: Atrial Fibrillation Ablation;  Surgeon: Thompson Grayer, MD;  Location: [place] CV LAB;  Service: Cardiovascular;  Laterality: N/A;   LEFT HEART CATHETERIZATION WITH CORONARY ANGIOGRAM N/A 04/04/2012  Procedure: LEFT HEART CATHETERIZATION WITH CORONARY ANGIOGRAM;  Surgeon: Sinclair Grooms, MD;  Location: [facility] CATH LAB;  Service: Cardiovascular;  Laterality: N/A;   PERCUTANEOUS CORONARY INTERVENTION-BALLOON ONLY Right 04/04/2012    Procedure: PERCUTANEOUS CORONARY INTERVENTION-BALLOON ONLY;  Surgeon: Sinclair Grooms, MD;  Location: [facility] C[facility] LAB;  Service: Cardiovascular;  Laterality: Right;   PERMANENT PACEMAKER INSERTION N/A 10/28/2011    Procedure: PERMANENT PACEMAKER INSERTION;  Surgeon: [name] Lance, MD;  Location[facility] CATH LAB;  Service: Cardiovascular;  Laterality: N/A;   TEE WITHOUT CARDIOVERSION   05/29/2012    Procedure: TRANSESOPHAGEAL ECHOCARDIOGRAM (TEE);  Surgeon: Sueanne Margarita, MD;  Location: [facility] ENDOSCOPY;  Service: Cardiovascular;  Laterality: N/A;  h/p in file drawer/dl             Family History  Problem Relation Age of Onset   Heart attack Mother 52   Pancreatic cancer Mother     Uterine cancer Mother     Breast cancer Mother     Heart  attack Father 95        Social History         Socioeconomic History   Marital status: Married      Spouse name: Not on file   Number of children: 4   Years of education: Not on file   Highest education level: Not on file  Occupational History   Not on file  Tobacco Use   Smoking status: Former      Packs/day: 0.12      Years: 15.00      Total pack years: 1.80      Types: Cigarettes      Quit date: 06/17/1977      Years since quitting: 45.1   Smokeless tobacco: Never   Tobacco comments:      QUIT smoking cigarettes 1978  Vaping Use   Vaping Use: Never used  Substance and Sexual Activity   Alcohol use: No   Drug use: No   Sexual activity: Never      Birth control/protection: Post-menopausal  Other Topics Concern   Not on file  Social History Narrative    Lives with husband.  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    Social Determinants of Adult nurse Strain: Not on file  Food Insecurity: Not on file  Transportation Needs: Not on file  Physical Activity: Not on file  Stress: Not on file  Social Connections: Not on file  Intimate Partner Violence: Not on file        BP 124/70   Pulse 65   Ht '5\' 3"'$  (1.6 m)   Wt 161 lb (73 kg)   SpO2 99%   BMI 28.52 kg/m    Physical Exam:   Well appearing NAD HEENT: Unremarkable Neck:  No JVD, no thyromegally Lymphatics:  No adenopathy Back:  No CVA tenderness Lungs:  Clear with no wheezes HEART:  Regular rate rhythm, no murmurs, no rubs, no clicks Abd:  soft, positive bowel sounds, no organomegally, no rebound, no guarding Ext:  2 plus pulses, no edema, no cyanosis, no clubbing Skin:  No rashes no nodules Neuro:  CN II through XII intact, motor grossly intact   EKG - NSR with atrial pacing   DEVICE  Normal device function.  See PaceArt for details. ERI   Assess/Plan: Sinus node dysfunction - she is asymptomatic s/p PPM insertion. PPM - her device has reached ERI. I have discussed the  indications/risks/benefits/goals/expectations of PPM  gen change out and she wishes to proceed. PAF - she appears to be maintaining NSR 98% of the time. HTN - her bp is well controlled. We will follow.   Cheryl [name] Dino Borntreger,MD

## 2022-08-26 DIAGNOSIS — M170 Bilateral primary osteoarthritis of knee: Secondary | ICD-10-CM | POA: Diagnosis not present

## 2022-08-29 NOTE — Progress Notes (Unsigned)
Cardiology Office Note Date:  08/31/2022  Patient ID:  Cheryl, [name] 18-Dec-1945, MRN [id number] PCP:  Aretta Nip, MD  Electrophysiologist: Dr. Rayann Heman >> Dr. Lovena Le    Chief Complaint:  wound check  History of Present Illness: Cheryl [name] is a 77 y.o. female with history of AFib (CTA ablation remotely , PVI ablation 2017), HTN, HLD, depression, hypothyroidism, tachy/brady w/PPM  She saw Dr. Lovena Le 07/15/22 after a hiatus from the office, noted device at [facility] and planned for gen change.  TODAY She is accompanied by her husband. She is doing well, no concerns No CP, palpitations or cardiac awareness No SOB No bleeding or signs of bleeding  Device information Abbott dual chamber PPM implanted 10/28/2011 > gen change 08/17/22   Past Medical History:  Diagnosis Date   Anemia    Arthritis    Blood transfusion ~ 1962   " couple; no reaction " (05/30/2012)   Coronary artery disease 2013   PCI PCI of OM   Depression    GERD (gastroesophageal reflux disease)    H/O hiatal hernia    HTN (hypertension)    Hypercholesteremia    Hypothyroidism    Myocardial infarction ([place])    "very very light" (05/30/2012)   PAF (paroxysmal atrial fibrillation) ([place]) 05/30/2012   s/p afib/flutter ablation 2013/2017   Tachy-brady syndrome ([place]) 10/2011   a. post-termination pauses in setting of PAF;  b. 10/28/11 - St. Jude Dual Chamber PPM SN [id number]    Ureteral obstruction, right     Past Surgical History:  Procedure Laterality Date   ATRIAL FIBRILLATION ABLATION N/A 05/30/2012   PVI and CTi ablation by Dr Rayann Heman   BREAST SURGERY  1990's   "right radial scar" (05/30/2012)   CORONARY ANGIOPLASTY  ~ 04/2012   CYSTOSCOPY WITH RETROGRADE PYELOGRAM, URETEROSCOPY AND STENT PLACEMENT  08/21/2012   Procedure: [place], URETEROSCOPY AND STENT PLACEMENT;  Surgeon: Franchot Gallo, MD;  Location: [facility] ORS;  Service: Urology;  Laterality: Right;  URETEROSCOPY  WITH BIOPSY    ELECTROPHYSIOLOGIC STUDY N/A 09/23/2015   Procedure: Atrial Fibrillation Ablation;  Surgeon: Thompson Grayer, MD;  Location: [place] CV LAB;  Service: Cardiovascular;  Laterality: N/A;   LEFT HEART CATHETERIZATION WITH CORONARY ANGIOGRAM N/A 04/04/2012   Procedure: LEFT HEART CATHETERIZATION WITH CORONARY ANGIOGRAM;  Surgeon: Sinclair Grooms, MD;  Location: [facility] CATH LAB;  Service: Cardiovascular;  Laterality: N/A;   PERCUTANEOUS CORONARY INTERVENTION-BALLOON ONLY Right 04/04/2012   Procedure: PERCUTANEOUS CORONARY INTERVENTION-BALLOON ONLY;  Surgeon: Sinclair Grooms, MD;  Location: [facility] CATH LAB;  Service: Cardiovascular;  Laterality: Right;   PERMANENT PACEMAKER INSERTION N/A 10/28/2011   Procedure: PERMANENT PACEMAKER INSERTION;  Surgeon: [name] Lance, MD;  Location: [facility] CATH LAB;  Service: Cardiovascular;  Laterality: N/A;   PPM GENERATOR CHANGEOUT N/A 08/17/2022   Procedure: PPM GENERATOR CHANGEOUT;  Surgeon: [name] Lance, MD;  Location: [place] CV LAB;  Service: Cardiovascular;  Laterality: N/A;   TEE WITHOUT CARDIOVERSION  05/29/2012   Procedure: TRANSESOPHAGEAL ECHOCARDIOGRAM (TEE);  Surgeon: Sueanne Margarita, MD;  Location: [facility] ENDOSCOPY;  Service: Cardiovascular;  Laterality: N/A;  h/p in file drawer/dl    Current Outpatient Medications  Medication Sig Dispense Refill   Cholecalciferol (VITAMIN D3) 250 MCG (10000 UT) capsule Take 10,000 Units by mouth daily.     diclofenac Sodium (VOLTAREN) 1 % GEL Apply 1 Application topically 2 (two) times daily as needed (joint pain).  ELIQUIS 5 MG TABS tablet TAKE 1 TABLET BY MOUTH TWICE DAILY 60 tablet 5   levothyroxine (SYNTHROID) 100 MCG tablet TAKE 1 TABLET BY MOUTH EVERY DAY BEFORE BREAKFAST 90 tablet 2   lisinopril (ZESTRIL) 10 MG tablet TAKE 1 TABLET BY MOUTH DAILY 90 tablet 3   metoprolol succinate (TOPROL-XL) 50 MG 24 hr tablet TAKE 1 TABLET BY MOUTH DAILY WITH OR IMMEDIATELY FOLLOWING A MEAL 90 tablet 3   rosuvastatin  (CRESTOR) 40 MG tablet Take 1 tablet (40 mg total) by mouth daily. 90 tablet 3   No current facility-administered medications for this visit.    Allergies:   Latex, Protonix [pantoprazole sodium], Codeine, and Tape   Social History:  The patient  reports that she quit smoking about 45 years ago. Her smoking use included cigarettes. She has a 1.80 pack-year smoking history. She has never used smokeless tobacco. She reports that she does not drink alcohol and does not use drugs.   Family History:  The patient's family history includes Breast cancer in her mother; Heart attack (age of onset: 60) in her father; Heart attack (age of onset: 50) in her mother; Pancreatic cancer in her mother; Uterine cancer in her mother.  ROS:  Please see the history of present illness.    All other systems are reviewed and otherwise negative.   PHYSICAL EXAM:  VS:  BP 136/78   Pulse 63   Ht '5\' 3"'$  (1.6 m)   Wt 160 lb 6.4 oz (72.8 kg)   SpO2 98%   BMI 28.41 kg/m  BMI: Body mass index is 28.41 kg/m. Well nourished, well developed, in no acute distress HEENT: normocephalic, atraumatic Neck: no JVD, carotid bruits or masses Cardiac:   RRR; no significant murmurs, no rubs, or gallops Lungs:   CTA b/l, no wheezing, rhonchi or rales Abd: soft, nontender MS: no deformity or  atrophy Ext:  no edema Skin: warm and dry, no rash Neuro:  No gross deficits appreciated Psych: euthymic mood, full affect   PPM site: steri strips are removed without difficulty, wound edges are well approximated, no hematoma, bleeding or drainage, no erythema (outside of some mild skin irritation for tape)/heat, site is stable, no tethering or discomfort   EKG:  not done today  Device interrogation done today and reviewed by myself:  Battery and lead measurements are good No arrhythmias   02/08/2017: TTE Study Conclusions  - Left ventricle: The cavity size was normal. Wall thickness was    normal. Systolic function was  vigorous. The estimated ejection    fraction was in the range of 65% to 70%. Wall motion was normal;    there were no regional wall motion abnormalities. Doppler    parameters are consistent with abnormal left ventricular    relaxation (grade 1 diastolic dysfunction). The E/e&' ratio is    between 8-15, suggesting indeterminate LV filling pressure.  - Left atrium: The atrium was normal in size.  - Right ventricle: Pacer wire or catheter noted in right ventricle.  - Right atrium: The atrium was normal in size. Pacer wire or    catheter noted in right atrium.  - Tricuspid valve: There was mild regurgitation.  - Pulmonary arteries: PA peak pressure: 33 mm Hg (S).  - Inferior vena cava: The vessel was normal in size. The    respirophasic diameter changes were in the normal range (>= 50%),    consistent with normal central venous pressure.   Impressions:  - Compared to a prior  study in 2013, the LVEF is higher at 65-70%.    09/23/2015: EPS/ablation CONCLUSIONS: 1. Sinus rhythm upon presentation with salvos of atrial fibrillation occuring incessantly and arising from the right inferior pulmonary vein.   2. Successful electrical isolation and anatomical encircling of all four pulmonary veins with radiofrequency current using a WACA technique.   3. CTi block confirmed from a prior ablation procedure 4. No inducible arrhythmias following ablation both on and off of dobutamine 5. No early apparent complications.  Recent Labs: 01/04/2022: ALT 10 08/03/2022: BUN 10; Creatinine, Ser 0.93; Hemoglobin 14.3; Platelets 279; Potassium 4.1; Sodium 141  01/04/2022: Chol/HDL Ratio 2.1; Cholesterol, Total 142; HDL 68; LDL Chol Calc (NIH) 58; Triglycerides 86   CrCl cannot be calculated (Patient's most recent lab result is older than the maximum 21 days allowed.).   Wt Readings from Last 3 Encounters:  08/31/22 160 lb 6.4 oz (72.8 kg)  08/17/22 160 lb (72.6 kg)  07/15/22 161 lb (73 kg)     Other studies  reviewed: Additional studies/records reviewed today include: summarized above  ASSESSMENT AND PLAN:  PPM Site looks good Well llealed, no signs of infection Intact device functioin No programming changes made  Paroxysmal Afib CHA2DS2Vasc is 4, on Eliquis, appropriately dosed zero% burden  HTN Looks good    Disposition: F/u with remotes as usual and Dr. Lovena Le as scheduled, sooner if needed  Current medicines are reviewed at length with the patient today.  The patient did not have any concerns regarding medicines.  Venetia Night, PA-C 08/31/2022 9:39 AM     [address] [place] [address] [phone] (office)  [phone] (fax)

## 2022-08-31 ENCOUNTER — Encounter: Payer: Self-pay | Admitting: Physician Assistant

## 2022-08-31 ENCOUNTER — Ambulatory Visit: Payer: Medicare HMO | Attending: Physician Assistant | Admitting: Physician Assistant

## 2022-08-31 ENCOUNTER — Encounter: Payer: Medicare HMO | Admitting: Internal Medicine

## 2022-08-31 VITALS — BP 136/78 | HR 63 | Ht 63.0 in | Wt 160.4 lb

## 2022-08-31 DIAGNOSIS — Z5189 Encounter for other specified aftercare: Secondary | ICD-10-CM

## 2022-08-31 DIAGNOSIS — I480 Paroxysmal atrial fibrillation: Secondary | ICD-10-CM | POA: Diagnosis not present

## 2022-08-31 DIAGNOSIS — I10 Essential (primary) hypertension: Secondary | ICD-10-CM | POA: Diagnosis not present

## 2022-08-31 DIAGNOSIS — M170 Bilateral primary osteoarthritis of knee: Secondary | ICD-10-CM | POA: Diagnosis not present

## 2022-08-31 DIAGNOSIS — Z950 Presence of cardiac pacemaker: Secondary | ICD-10-CM | POA: Diagnosis not present

## 2022-08-31 LAB — CUP PACEART INCLINIC DEVICE CHECK
Battery Remaining Longevity: 120 mo
Battery Voltage: 3.1 V
Brady Statistic RA Percent Paced: 82 %
Brady Statistic RV Percent Paced: 0.03 %
Date Time Interrogation Session: 20240130094852
Implantable Lead Connection Status: 753985
Implantable Lead Connection Status: 753985
Implantable Lead Implant Date: 20130328
Implantable Lead Implant Date: 20130328
Implantable Lead Location: 753859
Implantable Lead Location: 753860
Implantable Pulse Generator Implant Date: 20240116
Lead Channel Impedance Value: 362.5 Ohm
Lead Channel Impedance Value: 375 Ohm
Lead Channel Pacing Threshold Amplitude: 0.75 V
Lead Channel Pacing Threshold Amplitude: 0.75 V
Lead Channel Pacing Threshold Amplitude: 1.25 V
Lead Channel Pacing Threshold Amplitude: 1.25 V
Lead Channel Pacing Threshold Pulse Width: 0.5 ms
Lead Channel Pacing Threshold Pulse Width: 0.5 ms
Lead Channel Pacing Threshold Pulse Width: 0.5 ms
Lead Channel Pacing Threshold Pulse Width: 0.5 ms
Lead Channel Sensing Intrinsic Amplitude: 2.8 mV
Lead Channel Sensing Intrinsic Amplitude: 8.4 mV
Lead Channel Setting Pacing Amplitude: 2 V
Lead Channel Setting Pacing Amplitude: 2.5 V
Lead Channel Setting Pacing Pulse Width: 0.5 ms
Lead Channel Setting Sensing Sensitivity: 2 mV
Pulse Gen Model: 2272
Pulse Gen Serial Number: 5680516

## 2022-08-31 NOTE — Patient Instructions (Signed)
Medication Instructions:   Your physician recommends that you continue on your current medications as directed. Please refer to the Current Medication list given to you today.  *If you need a refill on your cardiac medications before your next appointment, please call your pharmacy*   Lab Work:  NONE ORDERED  TODAY    If you have labs (blood work) drawn today and your tests are completely normal, you will receive your results only by: MyChart Message (if you have MyChart) OR A paper copy in the mail If you have any lab test that is abnormal or we need to change your treatment, we will call you to review the results.   Testing/Procedures:  NONE ORDERED  TODAY   Follow-Up: At [facility], you and your health needs are our priority.  As part of our continuing mission to provide you with exceptional heart care, we have created designated Provider Care Teams.  These Care Teams include your primary Cardiologist (physician) and Advanced Practice Providers (APPs -  Physician Assistants and Nurse Practitioners) who all work together to provide you with the care you need, when you need it.  We recommend signing up for the patient portal called "MyChart".  Sign up information is provided on this After Visit Summary.  MyChart is used to connect with patients for Virtual Visits (Telemedicine).  Patients are able to view lab/test results, encounter notes, upcoming appointments, etc.  Non-urgent messages can be sent to your provider as well.   To learn more about what you can do with MyChart, go to [email or web address].    Your next appointment:   AS SCHEDULED   Provider:   Gregg Taylor, MD   Other Instructions   

## 2022-09-08 DIAGNOSIS — M170 Bilateral primary osteoarthritis of knee: Secondary | ICD-10-CM | POA: Diagnosis not present

## 2022-11-17 ENCOUNTER — Ambulatory Visit (INDEPENDENT_AMBULATORY_CARE_PROVIDER_SITE_OTHER): Payer: Medicare HMO

## 2022-11-17 DIAGNOSIS — I495 Sick sinus syndrome: Secondary | ICD-10-CM

## 2022-11-17 LAB — CUP PACEART REMOTE DEVICE CHECK
Battery Remaining Longevity: 108 mo
Battery Remaining Percentage: 95.5 %
Battery Voltage: 3.02 V
Brady Statistic AP VP Percent: 1 %
Brady Statistic AP VS Percent: 84 %
Brady Statistic AS VP Percent: 1 %
Brady Statistic AS VS Percent: 16 %
Brady Statistic RA Percent Paced: 83 %
Brady Statistic RV Percent Paced: 1 %
Date Time Interrogation Session: 20240417025043
Implantable Lead Connection Status: 753985
Implantable Lead Connection Status: 753985
Implantable Lead Implant Date: 20130328
Implantable Lead Implant Date: 20130328
Implantable Lead Location: 753859
Implantable Lead Location: 753860
Implantable Pulse Generator Implant Date: 20240116
Lead Channel Impedance Value: 360 Ohm
Lead Channel Impedance Value: 390 Ohm
Lead Channel Pacing Threshold Amplitude: 0.75 V
Lead Channel Pacing Threshold Amplitude: 1.25 V
Lead Channel Pacing Threshold Pulse Width: 0.5 ms
Lead Channel Pacing Threshold Pulse Width: 0.5 ms
Lead Channel Sensing Intrinsic Amplitude: 2.7 mV
Lead Channel Sensing Intrinsic Amplitude: 7.2 mV
Lead Channel Setting Pacing Amplitude: 2 V
Lead Channel Setting Pacing Amplitude: 2.5 V
Lead Channel Setting Pacing Pulse Width: 0.5 ms
Lead Channel Setting Sensing Sensitivity: 2 mV
Pulse Gen Model: 2272
Pulse Gen Serial Number: 5680516

## 2022-11-25 ENCOUNTER — Encounter: Payer: Self-pay | Admitting: Internal Medicine

## 2022-11-25 ENCOUNTER — Ambulatory Visit: Payer: Medicare HMO | Attending: Internal Medicine | Admitting: Internal Medicine

## 2022-11-25 VITALS — BP 142/70 | HR 65 | Ht 63.0 in | Wt 160.8 lb

## 2022-11-25 DIAGNOSIS — I4819 Other persistent atrial fibrillation: Secondary | ICD-10-CM | POA: Diagnosis not present

## 2022-11-25 DIAGNOSIS — I495 Sick sinus syndrome: Secondary | ICD-10-CM | POA: Diagnosis not present

## 2022-11-25 DIAGNOSIS — Z950 Presence of cardiac pacemaker: Secondary | ICD-10-CM | POA: Diagnosis not present

## 2022-11-25 NOTE — Patient Instructions (Signed)
Medication Instructions:  Your physician recommends that you continue on your current medications as directed. Please refer to the Current Medication list given to you today.  *If you need a refill on your cardiac medications before your next appointment, please call your pharmacy*  Lab Work: None ordered.  If you have labs (blood work) drawn today and your tests are completely normal, you will receive your results only by: MyChart Message (if you have MyChart) OR A paper copy in the mail If you have any lab test that is abnormal or we need to change your treatment, we will call you to review the results.  Testing/Procedures: None ordered.  Follow-Up: At [facility], you and your health needs are our priority.  As part of our continuing mission to provide you with exceptional heart care, we have created designated Provider Care Teams.  These Care Teams include your primary Cardiologist (physician) and Advanced Practice Providers (APPs -  Physician Assistants and Nurse Practitioners) who all work together to provide you with the care you need, when you need it.  We recommend signing up for the patient portal called "MyChart".  Sign up information is provided on this After Visit Summary.  MyChart is used to connect with patients for Virtual Visits (Telemedicine).  Patients are able to view lab/test results, encounter notes, upcoming appointments, etc.  Non-urgent messages can be sent to your provider as well.   To learn more about what you can do with MyChart, go to [email or web address].    Your next appointment:   1 year(s)  The format for your next appointment:   In Person  Provider:   Lewayne Bunting, MD{or one of the following Advanced Practice Providers on your designated Care Team:   Francis Dowse, [place] Casimiro Needle "Mardelle Matte" Lanna Poche, [place]  Remote monitoring is used to monitor your Pacemaker from home. This monitoring reduces the number of office visits required to check your device to  one time per year. It allows [place] to keep an eye on the functioning of your device to ensure it is working properly. You are scheduled for a device check from home on 02/16/23. You may send your transmission at any time that day. If you have a wireless device, the transmission will be sent automatically. After your physician reviews your transmission, you will receive a postcard with your next transmission date.

## 2022-11-25 NOTE — Progress Notes (Signed)
HPI Cheryl [name] returns today after a long absence from our EP clinic. She is a pleasant 77 yo woman with a h/o symptomatic sinus node dysfunction, PAF, s/p ablation X 2. She had been followed by Dr. Johney Frame after I have placed her PPM in about 10 years ago. She has done well in the interim with no symptomatic atrial fib. She has reached ERI on her PPM and underwent gen change out over 3 months ago. No other complaints today.  Allergies  Allergen Reactions   Latex Itching   Protonix [Pantoprazole Sodium] Other (See Comments)    "think it caused me to have a fib"   Codeine Nausea And Vomiting   Tape Rash    "Paper tape is ok"     Current Outpatient Medications  Medication Sig Dispense Refill   Cholecalciferol (VITAMIN D3) 250 MCG (10000 UT) capsule Take 10,000 Units by mouth daily.     diclofenac Sodium (VOLTAREN) 1 % GEL Apply 1 Application topically 2 (two) times daily as needed (joint pain).     ELIQUIS 5 MG TABS tablet TAKE 1 TABLET BY MOUTH TWICE DAILY 60 tablet 5   levothyroxine (SYNTHROID) 100 MCG tablet TAKE 1 TABLET BY MOUTH EVERY DAY BEFORE BREAKFAST 90 tablet 2   lisinopril (ZESTRIL) 10 MG tablet TAKE 1 TABLET BY MOUTH DAILY 90 tablet 3   metoprolol succinate (TOPROL-XL) 50 MG 24 hr tablet TAKE 1 TABLET BY MOUTH DAILY WITH OR IMMEDIATELY FOLLOWING A MEAL 90 tablet 3   rosuvastatin (CRESTOR) 40 MG tablet Take 1 tablet (40 mg total) by mouth daily. 90 tablet 3   No current facility-administered medications for this visit.     Past Medical History:  Diagnosis Date   Anemia    Arthritis    Blood transfusion ~ 1962   " couple; no reaction " (05/30/2012)   Coronary artery disease 2013   PCI PCI of OM   Depression    GERD (gastroesophageal reflux disease)    H/O hiatal hernia    HTN (hypertension)    Hypercholesteremia    Hypothyroidism    Myocardial infarction    "very very light" (05/30/2012)   PAF (paroxysmal atrial fibrillation) 05/30/2012   s/p afib/flutter  ablation 2013/2017   Tachy-brady syndrome 10/2011   a. post-termination pauses in setting of PAF;  b. 10/28/11 - [employer] Dual Chamber PPM SN [id number]    Ureteral obstruction, right     ROS:   All systems reviewed and negative except as noted in the HPI.   Past Surgical History:  Procedure Laterality Date   ATRIAL FIBRILLATION ABLATION N/A 05/30/2012   PVI and CTi ablation by Dr Johney Frame   BREAST SURGERY  1990's   "right radial scar" (05/30/2012)   CORONARY ANGIOPLASTY  ~ 04/2012   CYSTOSCOPY WITH RETROGRADE PYELOGRAM, URETEROSCOPY AND STENT PLACEMENT  08/21/2012   Procedure: CYSTOSCOPY WITH RETROGRADE PYELOGRAM, URETEROSCOPY AND STENT PLACEMENT;  Surgeon: Marcine Matar, MD;  Location: [facility];  Service: Urology;  Laterality: Right;  URETEROSCOPY WITH BIOPSY    ELECTROPHYSIOLOGIC STUDY N/A 09/23/2015   Procedure: Atrial Fibrillation Ablation;  Surgeon: Hillis Range, MD;  Location: [facility] INVASIVE CV LAB;  Service: Cardiovascular;  Laterality: N/A;   LEFT HEART CATHETERIZATION WITH CORONARY ANGIOGRAM N/A 04/04/2012   Procedure: LEFT HEART CATHETERIZATION WITH CORONARY ANGIOGRAM;  Surgeon: Lesleigh Noe, MD;  Location: [facility] CATH LAB;  Service: Cardiovascular;  Laterality: N/A;   PERCUTANEOUS CORONARY INTERVENTION-BALLOON ONLY Right 04/04/2012   Procedure:  PERCUTANEOUS CORONARY INTERVENTION-BALLOON ONLY;  Surgeon: Lesleigh Noe, MD;  Location: [facility] CATH LAB;  Service: Cardiovascular;  Laterality: Right;   PERMANENT PACEMAKER INSERTION N/A 10/28/2011   Procedure: PERMANENT PACEMAKER INSERTION;  Surgeon: Marinus Maw, MD;  Location: [facility] CATH LAB;  Service: Cardiovascular;  Laterality: N/A;   PPM GENERATOR CHANGEOUT N/A 08/17/2022   Procedure: PPM GENERATOR CHANGEOUT;  Surgeon: Marinus Maw, MD;  Location: [facility] INVASIVE CV LAB;  Service: Cardiovascular;  Laterality: N/A;   TEE WITHOUT CARDIOVERSION  05/29/2012   Procedure: TRANSESOPHAGEAL ECHOCARDIOGRAM (TEE);  Surgeon: Quintella Reichert, MD;   Location: [facility] ENDOSCOPY;  Service: Cardiovascular;  Laterality: N/A;  h/p in file drawer/dl     Family History  Problem Relation Age of Onset   Heart attack Mother 67   Pancreatic cancer Mother    Uterine cancer Mother    Breast cancer Mother    Heart attack Father 9     Social History   Socioeconomic History   Marital status: Married    Spouse name: Not on file   Number of children: 4   Years of education: Not on file   Highest education level: Not on file  Occupational History   Not on file  Tobacco Use   Smoking status: Former    Packs/day: 0.12    Years: 15.00    Additional pack years: 0.00    Total pack years: 1.80    Types: Cigarettes    Quit date: 06/17/1977    Years since quitting: 45.4   Smokeless tobacco: Never   Tobacco comments:    QUIT smoking cigarettes 1978  Vaping Use   Vaping Use: Never used  Substance and Sexual Activity   Alcohol use: No   Drug use: No   Sexual activity: Never    Birth control/protection: Post-menopausal  Other Topics Concern   Not on file  Social History Narrative   Lives with husband.  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   Social Determinants of Corporate investment banker Strain: Not on file  Food Insecurity: Not on file  Transportation Needs: Not on file  Physical Activity: Not on file  Stress: Not on file  Social Connections: Not on file  Intimate Partner Violence: Not on file     BP (!) 142/70   Pulse 65   Ht  (1.6 m)   Wt 160 lb 12.8 oz (72.9 kg)   SpO2 98%   BMI 28.48 kg/m   Physical Exam:  Well appearing NAD HEENT: Unremarkable Neck:  No JVD, no thyromegally Lymphatics:  No adenopathy Back:  No CVA tenderness Lungs:  Clear with no wheezes HEART:  Regular rate rhythm, no murmurs, no rubs, no clicks Abd:  soft, positive bowel sounds, no organomegally, no rebound, no guarding Ext:  2 plus pulses, no edema, no cyanosis, no clubbing Skin:  No rashes no nodules Neuro:  CN II through XII  intact, motor grossly intact  EKG - nsr with atrial pacing  DEVICE  Normal device function.  See PaceArt for details.   Assess/Plan:  Sinus node dysfunction - she is asymptomatic s/p PPM insertion. PPM -her device is working normally. We will recheck in several months. PAF - she appears to be maintaining NSR 98% of the time. HTN - her bp is well controlled. We will follow.   Sharlot Gowda Oyuki Hogan,MD

## 2022-12-13 ENCOUNTER — Other Ambulatory Visit: Payer: Self-pay | Admitting: Cardiology

## 2022-12-13 DIAGNOSIS — I119 Hypertensive heart disease without heart failure: Secondary | ICD-10-CM

## 2022-12-16 DIAGNOSIS — I10 Essential (primary) hypertension: Secondary | ICD-10-CM | POA: Diagnosis not present

## 2022-12-16 DIAGNOSIS — E039 Hypothyroidism, unspecified: Secondary | ICD-10-CM | POA: Diagnosis not present

## 2022-12-16 DIAGNOSIS — R200 Anesthesia of skin: Secondary | ICD-10-CM | POA: Diagnosis not present

## 2022-12-16 DIAGNOSIS — Z6829 Body mass index (BMI) 29.0-29.9, adult: Secondary | ICD-10-CM | POA: Diagnosis not present

## 2022-12-16 DIAGNOSIS — E785 Hyperlipidemia, unspecified: Secondary | ICD-10-CM | POA: Diagnosis not present

## 2022-12-16 DIAGNOSIS — K219 Gastro-esophageal reflux disease without esophagitis: Secondary | ICD-10-CM | POA: Diagnosis not present

## 2022-12-16 DIAGNOSIS — I4891 Unspecified atrial fibrillation: Secondary | ICD-10-CM | POA: Diagnosis not present

## 2022-12-20 NOTE — Progress Notes (Signed)
Remote pacemaker transmission.   

## 2022-12-30 DIAGNOSIS — H5203 Hypermetropia, bilateral: Secondary | ICD-10-CM | POA: Diagnosis not present

## 2023-01-02 DIAGNOSIS — Z0100 Encounter for examination of eyes and vision without abnormal findings: Secondary | ICD-10-CM | POA: Diagnosis not present

## 2023-01-10 ENCOUNTER — Other Ambulatory Visit: Payer: Self-pay | Admitting: Cardiology

## 2023-01-10 DIAGNOSIS — I119 Hypertensive heart disease without heart failure: Secondary | ICD-10-CM

## 2023-01-24 ENCOUNTER — Other Ambulatory Visit: Payer: Self-pay | Admitting: Cardiology

## 2023-01-24 DIAGNOSIS — I4819 Other persistent atrial fibrillation: Secondary | ICD-10-CM

## 2023-01-24 NOTE — Telephone Encounter (Signed)
Prescription refill request for Eliquis received. Indication: AF Last office visit: 11/25/22  Rosette Reveal MD Scr: 0.93 on 08/03/22  Epic Age: 78 Weight: 72.9kg  Based on above findings Eliquis 5mg  twice daily is the appropriate dose.  Refill approved.

## 2023-02-10 ENCOUNTER — Other Ambulatory Visit: Payer: Self-pay | Admitting: Cardiology

## 2023-02-10 DIAGNOSIS — I119 Hypertensive heart disease without heart failure: Secondary | ICD-10-CM

## 2023-02-11 DIAGNOSIS — H524 Presbyopia: Secondary | ICD-10-CM | POA: Diagnosis not present

## 2023-02-16 ENCOUNTER — Ambulatory Visit (INDEPENDENT_AMBULATORY_CARE_PROVIDER_SITE_OTHER): Payer: Medicare HMO

## 2023-02-16 DIAGNOSIS — I495 Sick sinus syndrome: Secondary | ICD-10-CM | POA: Diagnosis not present

## 2023-02-16 LAB — CUP PACEART REMOTE DEVICE CHECK
Battery Remaining Longevity: 105 mo
Battery Remaining Percentage: 95.5 %
Battery Voltage: 3.01 V
Brady Statistic AP VP Percent: 1 %
Brady Statistic AP VS Percent: 80 %
Brady Statistic AS VP Percent: 1 %
Brady Statistic AS VS Percent: 20 %
Brady Statistic RA Percent Paced: 79 %
Brady Statistic RV Percent Paced: 1 %
Date Time Interrogation Session: 20240717020015
Implantable Lead Connection Status: 753985
Implantable Lead Connection Status: 753985
Implantable Lead Implant Date: 20130328
Implantable Lead Implant Date: 20130328
Implantable Lead Location: 753859
Implantable Lead Location: 753860
Implantable Pulse Generator Implant Date: 20240116
Lead Channel Impedance Value: 340 Ohm
Lead Channel Impedance Value: 390 Ohm
Lead Channel Pacing Threshold Amplitude: 0.75 V
Lead Channel Pacing Threshold Amplitude: 1.25 V
Lead Channel Pacing Threshold Pulse Width: 0.5 ms
Lead Channel Pacing Threshold Pulse Width: 0.5 ms
Lead Channel Sensing Intrinsic Amplitude: 2.9 mV
Lead Channel Sensing Intrinsic Amplitude: 5.7 mV
Lead Channel Setting Pacing Amplitude: 2 V
Lead Channel Setting Pacing Amplitude: 2.5 V
Lead Channel Setting Pacing Pulse Width: 0.5 ms
Lead Channel Setting Sensing Sensitivity: 2 mV
Pulse Gen Model: 2272
Pulse Gen Serial Number: 5680516

## 2023-03-03 NOTE — Progress Notes (Signed)
Remote pacemaker transmission.   

## 2023-04-07 NOTE — Progress Notes (Signed)
Cardiology Office Note:  .   Date:  04/18/2023  ID:  Cheryl [name], DOB 01/10/1946, MRN [id number] PCP: Clayborn Heron, MD  [facility] Providers Cardiologist:  Armanda Magic, MD    History of Present Illness: .   Cheryl [name] is a 77 y.o. female  with a hx of PAF/flutter s/p ablation x 2, tachy/brady syndrome s/p PPM-generator change 08/2022, HTN, ASCAD s/p PCI of OM and dyslipidemia.    Patient comes in for yearly f/u. Denies chest pain, dyspnea, palpitations. Has right ankle swelling when she sits awhile. Has knee trouble that she thinks is causing it. She eats high salt diet.   ROS:    Studies Reviewed: Marland Kitchen         Prior CV Studies:     Echo 2018 Study Conclusions   - Left ventricle: The cavity size was normal. Wall thickness was    normal. Systolic function was vigorous. The estimated ejection    fraction was in the range of 65% to 70%. Wall motion was normal;    there were no regional wall motion abnormalities. Doppler    parameters are consistent with abnormal left ventricular    relaxation (grade 1 diastolic dysfunction). The E/e&' ratio is    between 8-15, suggesting indeterminate LV filling pressure.  - Left atrium: The atrium was normal in size.  - Right ventricle: Pacer wire or catheter noted in right ventricle.  - Right atrium: The atrium was normal in size. Pacer wire or    catheter noted in right atrium.  - Tricuspid valve: There was mild regurgitation.  - Pulmonary arteries: PA peak pressure: 33 mm Hg (S).  - Inferior vena cava: The vessel was normal in size. The    respirophasic diameter changes were in the normal range (>= 50%),    consistent with normal central venous pressure.   Impressions:   - Compared to a prior study in 2013, the LVEF is higher at 65-70%.     Risk Assessment/Calculations:             Physical Exam:   VS:  BP 124/70   Pulse 63   Ht 5\' 3"  (1.6 m)   Wt 160 lb (72.6 kg)   SpO2 98%   BMI 28.34 kg/m    Wt Readings  from Last 3 Encounters:  04/18/23 160 lb (72.6 kg)  11/25/22 160 lb 12.8 oz (72.9 kg)  08/31/22 160 lb 6.4 oz (72.8 kg)    GEN: Well nourished, well developed in no acute distress NECK: No JVD; No carotid bruits CARDIAC:  RRR, no murmurs, rubs, gallops RESPIRATORY:  Clear to auscultation without rales, wheezing or rhonchi  ABDOMEN: Soft, non-tender, non-distended EXTREMITIES:  No edema; No deformity   ASSESSMENT AND PLAN: .    Sinus node dysfunction s/p PPM-generator change 08/2022  PAF-s/p ablation 2013/2017 in NSR 98% of the time, on toprol and eliquis-no bleeding problems. Check labs today  CAD PCI OM 2013-no angina. No ASA on eliquis, continue crestor  HTN-controlled on lisinopril and metoprolol  HLD on Crestor-check FLP today  Leg swelling after sitting for awhile-eats high salt diet- 2 gm sodium diet.        Dispo: f/u in 1 yr  Signed, Jacolyn Reedy, PA-C

## 2023-04-18 ENCOUNTER — Ambulatory Visit: Payer: Medicare HMO | Attending: Physician Assistant | Admitting: Physician Assistant

## 2023-04-18 ENCOUNTER — Encounter: Payer: Self-pay | Admitting: Physician Assistant

## 2023-04-18 VITALS — BP 124/70 | HR 63 | Ht 63.0 in | Wt 160.0 lb

## 2023-04-18 DIAGNOSIS — R600 Localized edema: Secondary | ICD-10-CM | POA: Diagnosis not present

## 2023-04-18 DIAGNOSIS — E785 Hyperlipidemia, unspecified: Secondary | ICD-10-CM | POA: Diagnosis not present

## 2023-04-18 DIAGNOSIS — I4819 Other persistent atrial fibrillation: Secondary | ICD-10-CM | POA: Diagnosis not present

## 2023-04-18 DIAGNOSIS — I2510 Atherosclerotic heart disease of native coronary artery without angina pectoris: Secondary | ICD-10-CM

## 2023-04-18 DIAGNOSIS — Z950 Presence of cardiac pacemaker: Secondary | ICD-10-CM

## 2023-04-18 DIAGNOSIS — I10 Essential (primary) hypertension: Secondary | ICD-10-CM

## 2023-04-18 MED ORDER — APIXABAN 5 MG PO TABS: 5.0000 mg | ORAL_TABLET | Freq: Two times a day (BID) | ORAL | 1 refills | Status: DC

## 2023-04-18 MED ORDER — ROSUVASTATIN CALCIUM 40 MG PO TABS: 40.0000 mg | ORAL_TABLET | Freq: Every day | ORAL | 3 refills | Status: DC

## 2023-04-18 NOTE — Patient Instructions (Addendum)
Medication Instructions:  Your physician recommends that you continue on your current medications as directed. Please refer to the Current Medication list given to you today. *If you need a refill on your cardiac medications before your next appointment, please call your pharmacy*   Lab Work: TODAY-LIPID, BMET & CBC If you have labs (blood work) drawn today and your tests are completely normal, you will receive your results only by: MyChart Message (if you have MyChart) OR A paper copy in the mail If you have any lab test that is abnormal or we need to change your treatment, we will call you to review the results.   Testing/Procedures: NONE ORDERED   Follow-Up: At [facility], you and your health needs are our priority.  As part of our continuing mission to provide you with exceptional heart care, we have created designated Provider Care Teams.  These Care Teams include your primary Cardiologist (physician) and Advanced Practice Providers (APPs -  Physician Assistants and Nurse Practitioners) who all work together to provide you with the care you need, when you need it.  We recommend signing up for the patient portal called "MyChart".  Sign up information is provided on this After Visit Summary.  MyChart is used to connect with patients for Virtual Visits (Telemedicine).  Patients are able to view lab/test results, encounter notes, upcoming appointments, etc.  Non-urgent messages can be sent to your provider as well.   To learn more about what you can do with MyChart, go to [email or web address].    Your next appointment:   12 month(s)  Provider:   Armanda Magic, MD     Other Instructions Limit your salt intake to 1500-2000mg  per day or 500mg  of Sodium per meal. Please increase your physical activity. Please exercise for 150 minutes a week or 30 minutes a day.

## 2023-04-19 LAB — BASIC METABOLIC PANEL
BUN/Creatinine Ratio: 11 — ABNORMAL LOW (ref 12–28)
BUN: 10 mg/dL (ref 8–27)
CO2: 25 mmol/L (ref 20–29)
Calcium: 9.7 mg/dL (ref 8.7–10.3)
Chloride: 102 mmol/L (ref 96–106)
Creatinine, Ser: 0.9 mg/dL (ref 0.57–1.00)
Glucose: 89 mg/dL (ref 70–99)
Potassium: 4.2 mmol/L (ref 3.5–5.2)
Sodium: 144 mmol/L (ref 134–144)
eGFR: 66 mL/min/{1.73_m2} (ref >59)

## 2023-04-19 LAB — CBC
Hematocrit: 44.2 % (ref 34.0–46.6)
Hemoglobin: 14 g/dL (ref 11.1–15.9)
MCH: 28.1 pg (ref 26.6–33.0)
MCHC: 31.7 g/dL (ref 31.5–35.7)
MCV: 89 fL (ref 79–97)
Platelets: 302 10*3/uL (ref 150–450)
RBC: 4.98 x10E6/uL (ref 3.77–5.28)
RDW: 13 % (ref 11.7–15.4)
WBC: 7.6 10*3/uL (ref 3.4–10.8)

## 2023-04-19 LAB — LIPID PANEL
Chol/HDL Ratio: 2.3 ratio (ref 0.0–4.4)
Cholesterol, Total: 142 mg/dL (ref 100–199)
HDL: 63 mg/dL (ref >39)
LDL Chol Calc (NIH): 63 mg/dL (ref 0–99)
Triglycerides: 83 mg/dL (ref 0–149)
VLDL Cholesterol Cal: 16 mg/dL (ref 5–40)

## 2023-05-18 LAB — CUP PACEART REMOTE DEVICE CHECK
Battery Remaining Longevity: 102 mo
Battery Remaining Percentage: 95.5 %
Battery Voltage: 3.01 V
Brady Statistic AP VP Percent: 1 %
Brady Statistic AP VS Percent: 82 %
Brady Statistic AS VP Percent: 1 %
Brady Statistic AS VS Percent: 18 %
Brady Statistic RA Percent Paced: 81 %
Brady Statistic RV Percent Paced: 1 %
Date Time Interrogation Session: 20241016021154
Implantable Lead Connection Status: 753985
Implantable Lead Connection Status: 753985
Implantable Lead Implant Date: 20130328
Implantable Lead Implant Date: 20130328
Implantable Lead Location: 753859
Implantable Lead Location: 753860
Implantable Pulse Generator Implant Date: 20240116
Lead Channel Impedance Value: 330 Ohm
Lead Channel Impedance Value: 390 Ohm
Lead Channel Pacing Threshold Amplitude: 0.75 V
Lead Channel Pacing Threshold Amplitude: 1.25 V
Lead Channel Pacing Threshold Pulse Width: 0.5 ms
Lead Channel Pacing Threshold Pulse Width: 0.5 ms
Lead Channel Sensing Intrinsic Amplitude: 1.9 mV
Lead Channel Sensing Intrinsic Amplitude: 5.8 mV
Lead Channel Setting Pacing Amplitude: 2 V
Lead Channel Setting Pacing Amplitude: 2.5 V
Lead Channel Setting Pacing Pulse Width: 0.5 ms
Lead Channel Setting Sensing Sensitivity: 2 mV
Pulse Gen Model: 2272
Pulse Gen Serial Number: 5680516

## 2023-05-23 ENCOUNTER — Telehealth: Payer: Self-pay

## 2023-05-23 NOTE — Telephone Encounter (Signed)
Transmission received 05/23/2023

## 2023-05-23 NOTE — Telephone Encounter (Signed)
Around 2 am Saturday morning she woke up feeling like something sitting on her chest. I asked her to send a manual transmission with her home remote monitor. She agreed. She was on her land line so I told her once we receive the transmission the nurse will review it and give her a call back.

## 2023-05-23 NOTE — Telephone Encounter (Signed)
Patient reports she was asleep and woke up with chest heaviness (felt like something sitting on her chest) with shortness of breath Saturday 05/21/23 night/early am. States heaviness lasted about 15 minutes, got out of bed went to sit in living room then chest heaviness improved. This is new for patient.  Remote transmission received and reviewed. Shows normal device function. No alerts and patient in SR. Patient advised of transmission and advised I will forward to Dr. Norris Cross staff to advise further.   Patient advised if she had chest heaviness/pain or any concerning symptoms to call 911 and to emergency department for evaluation. Pt voiced understanding.

## 2023-05-24 NOTE — Telephone Encounter (Signed)
Call to patient to discuss symptoms. Patient states she has had no chest pain/palpitations/pressure on exertion. She states she has not missed any doses of metoprolol. Unfortunately she does not track her BP or HR at home. She does not recall any other events of chest symptoms. Patient responses forwarded to Dr. Mayford Knife.

## 2023-05-26 ENCOUNTER — Ambulatory Visit: Payer: Medicare HMO | Admitting: Nurse Practitioner

## 2023-05-26 NOTE — Telephone Encounter (Signed)
Scheduled patient for DOD 05/27/23.

## 2023-05-27 ENCOUNTER — Ambulatory Visit: Payer: Medicare HMO | Attending: Cardiology | Admitting: Cardiology

## 2023-05-27 VITALS — Ht 63.0 in | Wt 158.6 lb

## 2023-05-27 DIAGNOSIS — I4819 Other persistent atrial fibrillation: Secondary | ICD-10-CM | POA: Diagnosis not present

## 2023-05-27 DIAGNOSIS — I2510 Atherosclerotic heart disease of native coronary artery without angina pectoris: Secondary | ICD-10-CM | POA: Diagnosis not present

## 2023-05-27 DIAGNOSIS — Z950 Presence of cardiac pacemaker: Secondary | ICD-10-CM

## 2023-05-27 DIAGNOSIS — I10 Essential (primary) hypertension: Secondary | ICD-10-CM | POA: Diagnosis not present

## 2023-05-27 NOTE — Patient Instructions (Signed)
Medication Instructions:  The current medical regimen is effective;  continue present plan and medications.  *If you need a refill on your cardiac medications before your next appointment, please call your pharmacy*  Follow-Up: At [facility], you and your health needs are our priority.  As part of our continuing mission to provide you with exceptional heart care, we have created designated Provider Care Teams.  These Care Teams include your primary Cardiologist (physician) and Advanced Practice Providers (APPs -  Physician Assistants and Nurse Practitioners) who all work together to provide you with the care you need, when you need it.  We recommend signing up for the patient portal called "MyChart".  Sign up information is provided on this After Visit Summary.  MyChart is used to connect with patients for Virtual Visits (Telemedicine).  Patients are able to view lab/test results, encounter notes, upcoming appointments, etc.  Non-urgent messages can be sent to your provider as well.   To learn more about what you can do with MyChart, go to [email or web address].    Your next appointment:   Follow up with Dr Mayford Knife as previously scheduled.

## 2023-05-27 NOTE — Progress Notes (Signed)
  Cardiology Office Note:  .   Date:  05/27/2023  ID:  Cheryl [name], DOB June 06, 1946, MRN [id number] PCP: Sigmund Hazel, MD  [facility] Providers Cardiologist:  Armanda Magic, MD     History of Present Illness: .   Cheryl [name] is a 77 y.o. female Discussed with the use of AI scribe   History of Present Illness   The patient, a 77 year old with a history of atrial flutter status post ablation, tachycardia syndrome status post pacemaker, hypertension, and coronary disease, presents for a routine follow-up.   She reports a single episode of chest discomfort and mild difficulty breathing, which occurred upon waking and resolved over the course of the morning. The patient describes the sensation as feeling like "someone was sitting on my chest." She denies any recurrence of this episode and has been able to continue with her normal activities. The patient denies any changes in diet or other potential triggers [name] to the episode. She has a history of a mild heart attack at age 69, which was managed with balloon angioplasty of the obtuse marginal branch (Dr. Katrinka Blazing). The patient is currently on Crestor 40mg , Lisinopril 10mg , and Eliquis 5mg  twice daily (AFIB).           Studies Reviewed: Marland Kitchen   EKG Interpretation Date/Time:  Friday May 27 2023 16:34:04 EDT Ventricular Rate:  65 PR Interval:  162 QRS Duration:  74 QT Interval:  424 QTC Calculation: 440 R Axis:   14  Text Interpretation: Normal sinus rhythm Low voltage QRS Nonspecific ST abnormality When compared with ECG of 30-Mar-2016 10:03, No significant change was found Confirmed by Donato Schultz ([id number]) on 05/27/2023 4:45:09 PM    Results LABS LDL: 63 mg/dL Creatinine: 0.9 mg/dL Troponin: Elevated (9528)  DIAGNOSTIC Echocardiogram: EF of 70% (2018) EKG: Stable (05/26/2023) Cardiac catheterization: Cutting balloon angioplasty of obtuse marginal branch (2013)  Risk Assessment/Calculations:           Physical Exam:    VS:  Ht 5\' 3"  (1.6 m)   Wt 158 lb 9.6 oz (71.9 kg)   BMI 28.09 kg/m    Wt Readings from Last 3 Encounters:  05/27/23 158 lb 9.6 oz (71.9 kg)  04/18/23 160 lb (72.6 kg)  11/25/22 160 lb 12.8 oz (72.9 kg)    GEN: Well nourished, well developed in no acute distress NECK: No JVD; No carotid bruits CARDIAC: RRR, no murmurs, no rubs, no gallops RESPIRATORY:  Clear to auscultation without rales, wheezing or rhonchi  ABDOMEN: Soft, non-tender, non-distended EXTREMITIES:  No edema; No deformity   ASSESSMENT AND PLAN: .    Assessment and Plan    Chest Discomfort Single episode of chest discomfort upon waking, resolved spontaneously. No associated exertional symptoms. Possible differential includes GERD vs cardiac etiology. No recurrent symptoms. -If symptoms recur or become exertional, consider stress testing.  Coronary Artery Disease History of acute coronary syndrome in 2013 with angioplasty of obtuse marginal branch. No stent placement. No recent cardiac symptoms. -Continue Crestor 40mg  daily for LDL control (current LDL 63).  Atrial Fibrillation Status post ablation x2, pacemaker placement. No recent arrhythmia symptoms. -Continue Eliquis 5mg  BID for stroke prevention.  Hypertension Well controlled. -Continue Lisinopril 10mg  daily.  Follow-up as needed for any changes in symptoms or concerns.             Dispo: As previously sched with Dr. Mayford Knife.   Signed, Donato Schultz, MD

## 2023-06-13 ENCOUNTER — Other Ambulatory Visit: Payer: Self-pay | Admitting: Family Medicine

## 2023-06-13 DIAGNOSIS — D6869 Other thrombophilia: Secondary | ICD-10-CM | POA: Diagnosis not present

## 2023-06-13 DIAGNOSIS — I2510 Atherosclerotic heart disease of native coronary artery without angina pectoris: Secondary | ICD-10-CM | POA: Diagnosis not present

## 2023-06-13 DIAGNOSIS — I495 Sick sinus syndrome: Secondary | ICD-10-CM | POA: Diagnosis not present

## 2023-06-13 DIAGNOSIS — I10 Essential (primary) hypertension: Secondary | ICD-10-CM | POA: Diagnosis not present

## 2023-06-13 DIAGNOSIS — E039 Hypothyroidism, unspecified: Secondary | ICD-10-CM | POA: Diagnosis not present

## 2023-06-13 DIAGNOSIS — Z6828 Body mass index (BMI) 28.0-28.9, adult: Secondary | ICD-10-CM | POA: Diagnosis not present

## 2023-06-13 DIAGNOSIS — E559 Vitamin D deficiency, unspecified: Secondary | ICD-10-CM | POA: Diagnosis not present

## 2023-06-13 DIAGNOSIS — Z13820 Encounter for screening for osteoporosis: Secondary | ICD-10-CM

## 2023-06-13 DIAGNOSIS — E785 Hyperlipidemia, unspecified: Secondary | ICD-10-CM | POA: Diagnosis not present

## 2023-06-13 DIAGNOSIS — Z0000 Encounter for general adult medical examination without abnormal findings: Secondary | ICD-10-CM | POA: Diagnosis not present

## 2023-07-23 DIAGNOSIS — Z1211 Encounter for screening for malignant neoplasm of colon: Secondary | ICD-10-CM | POA: Diagnosis not present

## 2023-07-23 DIAGNOSIS — Z1212 Encounter for screening for malignant neoplasm of rectum: Secondary | ICD-10-CM | POA: Diagnosis not present

## 2023-07-29 LAB — COLOGUARD: COLOGUARD: NEGATIVE

## 2023-08-17 ENCOUNTER — Ambulatory Visit (INDEPENDENT_AMBULATORY_CARE_PROVIDER_SITE_OTHER): Payer: Medicare HMO

## 2023-08-17 DIAGNOSIS — I495 Sick sinus syndrome: Secondary | ICD-10-CM | POA: Diagnosis not present

## 2023-08-17 LAB — CUP PACEART REMOTE DEVICE CHECK
Battery Remaining Longevity: 101 mo
Battery Remaining Percentage: 94 %
Battery Voltage: 3.01 V
Brady Statistic AP VP Percent: 1 %
Brady Statistic AP VS Percent: 82 %
Brady Statistic AS VP Percent: 1 %
Brady Statistic AS VS Percent: 18 %
Brady Statistic RA Percent Paced: 80 %
Brady Statistic RV Percent Paced: 1 %
Date Time Interrogation Session: 20250115020013
Implantable Lead Connection Status: 753985
Implantable Lead Connection Status: 753985
Implantable Lead Implant Date: 20130328
Implantable Lead Implant Date: 20130328
Implantable Lead Location: 753859
Implantable Lead Location: 753860
Implantable Pulse Generator Implant Date: 20240116
Lead Channel Impedance Value: 360 Ohm
Lead Channel Impedance Value: 390 Ohm
Lead Channel Pacing Threshold Amplitude: 0.75 V
Lead Channel Pacing Threshold Amplitude: 1.25 V
Lead Channel Pacing Threshold Pulse Width: 0.5 ms
Lead Channel Pacing Threshold Pulse Width: 0.5 ms
Lead Channel Sensing Intrinsic Amplitude: 1.2 mV
Lead Channel Sensing Intrinsic Amplitude: 6.8 mV
Lead Channel Setting Pacing Amplitude: 2 V
Lead Channel Setting Pacing Amplitude: 2.5 V
Lead Channel Setting Pacing Pulse Width: 0.5 ms
Lead Channel Setting Sensing Sensitivity: 2 mV
Pulse Gen Model: 2272
Pulse Gen Serial Number: 5680516

## 2023-08-30 ENCOUNTER — Telehealth: Payer: Self-pay | Admitting: Cardiology

## 2023-08-30 DIAGNOSIS — I4819 Other persistent atrial fibrillation: Secondary | ICD-10-CM

## 2023-08-30 MED ORDER — APIXABAN 5 MG PO TABS: 5.0000 mg | ORAL_TABLET | Freq: Two times a day (BID) | ORAL | 1 refills | Status: DC

## 2023-08-30 NOTE — Telephone Encounter (Signed)
Prescription refill request for Eliquis received. Indication: AF Last office visit: 05/27/23  Michele Rockers MD Scr: 0.90 on 04/18/23  Epic Age: 78 Weight: 71.9kg  Based on above findings Eliquis 5mg  twice daily is the appropriate dose.  Refill approved.

## 2023-08-30 NOTE — Telephone Encounter (Signed)
*  STAT* If patient is at the pharmacy, call can be transferred to refill team.   1. Which medications need to be refilled? (please list name of each medication and dose if known)   apixaban (ELIQUIS) 5 MG TABS tablet   2. Would you like to learn more about the convenience, safety, & potential cost savings by using the [facility]?   3. Are you open to using the [facility] (Type [facility]. ).  4. Which pharmacy/location (including street and city if local pharmacy) is medication to be sent to?    [facility], ph# [phone]  5. Do they need a 30 day or 90 day supply?  90 days  Patient noted she wants this medication sent to her new Psychologist, sport and exercise.  Patient stated she still has some of this medication.

## 2023-09-23 ENCOUNTER — Telehealth: Payer: Self-pay | Admitting: Cardiology

## 2023-09-23 DIAGNOSIS — M79671 Pain in right foot: Secondary | ICD-10-CM

## 2023-09-23 NOTE — Telephone Encounter (Signed)
  Let's order bilateral LE arterial vascular u/s / ABI Foot pain Donato Schultz, MD    LEAs with ABIs ordered.  Pt will be contacted to be scheduled.  Pt aware she will be contacted to be scheduled at the [facility] office.

## 2023-09-23 NOTE — Telephone Encounter (Signed)
 Pt reports this occurring for "quite awhile now".  Says it "started in my left foot, starts when I get into bed at night, like somebody squeezing your foot".  States that sometimes it keeps me up at night. Reports that she spoke to her PCP about a year ago/more and PCP said it was r/t vitamin deficiency.  Improved, but has now returned. Pt called our office in case she needs to be concerned that this is heart related. Advised pt to follow up with PCP about this. Aware sending to Dr. Anne Fu for review and our office will call back if he feels she needs cardiology follow up. Patient verbalized understanding and agreeable to plan.

## 2023-09-23 NOTE — Telephone Encounter (Signed)
 Patient is concerned that her feet has been getting numb at night and are OK during the day.  Patient wants to know if this is heart related.

## 2023-09-27 NOTE — Progress Notes (Signed)
 Remote pacemaker transmission.

## 2023-10-17 ENCOUNTER — Encounter (HOSPITAL_COMMUNITY): Payer: Medicare Other

## 2023-10-20 ENCOUNTER — Ambulatory Visit (HOSPITAL_COMMUNITY)
Admission: RE | Admit: 2023-10-20 | Discharge: 2023-10-20 | Disposition: A | Discharge status: Home / Self Care | Payer: Medicare Other | Source: Ambulatory Visit | Attending: Cardiovascular Disease | Admitting: Cardiovascular Disease

## 2023-10-20 ENCOUNTER — Ambulatory Visit (HOSPITAL_BASED_OUTPATIENT_CLINIC_OR_DEPARTMENT_OTHER)
Admission: RE | Admit: 2023-10-20 | Discharge: 2023-10-20 | Disposition: A | Discharge status: Home / Self Care | Payer: Medicare Other | Source: Ambulatory Visit | Attending: Cardiology | Admitting: Cardiology

## 2023-10-20 DIAGNOSIS — M79672 Pain in left foot: Secondary | ICD-10-CM | POA: Diagnosis not present

## 2023-10-20 DIAGNOSIS — M79671 Pain in right foot: Secondary | ICD-10-CM | POA: Diagnosis not present

## 2023-10-20 LAB — VAS US ABI WITH/WO TBI
Left ABI: 1.14
Right ABI: 1.1

## 2023-10-21 ENCOUNTER — Encounter: Payer: Self-pay | Admitting: Cardiology

## 2023-10-31 ENCOUNTER — Encounter (HOSPITAL_BASED_OUTPATIENT_CLINIC_OR_DEPARTMENT_OTHER): Payer: Self-pay | Admitting: *Deleted

## 2023-11-01 ENCOUNTER — Other Ambulatory Visit: Payer: Self-pay | Admitting: Cardiology

## 2023-11-01 DIAGNOSIS — I119 Hypertensive heart disease without heart failure: Secondary | ICD-10-CM

## 2023-11-07 NOTE — Progress Notes (Unsigned)
 Cardiology Office Note Date:  11/07/2023  Patient ID:  Cheryl [name] Dec 26, 1945, MRN [id number] PCP:  Sigmund Hazel, MD  Electrophysiologist: Dr. Johney Frame >> Dr. Ladona Ridgel    Chief Complaint:  *** annual visit  History of Present Illness: Cheryl [name] is a 78 y.o. female with history of  CAD (balloon angioplasty OM remotely)  HTN, HLD, depression, hypothyroidism,  tachy/brady w/PPM  I saw her 08/31/22 She is accompanied by her husband. Post gen change wound well healed She is doing well, no concerns No CP, palpitations or cardiac awareness No SOB No bleeding or signs of bleeding  She saw Dr. Ladona Ridgel 11/25/22, low AFib burden, doing well  Seeing Dr. Lalla Brothers a couple times Most recently 05/27/23, reported an episode of CP, not recurrent with plans to monitor.  No changes made  Recent ABI/LE studies  were good  TODAY  *** eliquis, dose, bleeding, labs *** AF burden *** device only   Device information Abbott dual chamber PPM implanted 10/28/2011 > gen change 08/17/22  Arrhythmia/AAD hx AFib (CTI ablation remotely , PVI ablation 2017)  Past Medical History:  Diagnosis Date   Anemia    Arthritis    Blood transfusion ~ 1962   " couple; no reaction " (05/30/2012)   Coronary artery disease 2013   PCI PCI of OM   Depression    GERD (gastroesophageal reflux disease)    H/O hiatal hernia    HTN (hypertension)    Hypercholesteremia    Hypothyroidism    Myocardial infarction (HCC)    "very very light" (05/30/2012)   PAF (paroxysmal atrial fibrillation) (HCC) 05/30/2012   s/p afib/flutter ablation 2013/2017   Tachy-brady syndrome (HCC) 10/2011   a. post-termination pauses in setting of PAF;  b. 10/28/11 - St. Jude Dual Chamber PPM SN [id number]    Ureteral obstruction, right     Past Surgical History:  Procedure Laterality Date   ATRIAL FIBRILLATION ABLATION N/A 05/30/2012   PVI and CTi ablation by Dr Johney Frame   BREAST SURGERY  1990's   "right radial scar"  (05/30/2012)   CORONARY ANGIOPLASTY  ~ 04/2012   CYSTOSCOPY WITH RETROGRADE PYELOGRAM, URETEROSCOPY AND STENT PLACEMENT  08/21/2012   Procedure: CYSTOSCOPY WITH RETROGRADE PYELOGRAM, URETEROSCOPY AND STENT PLACEMENT;  Surgeon: Marcine Matar, MD;  Location: [facility];  Service: Urology;  Laterality: Right;  URETEROSCOPY WITH BIOPSY    ELECTROPHYSIOLOGIC STUDY N/A 09/23/2015   Procedure: Atrial Fibrillation Ablation;  Surgeon: Hillis Range, MD;  Location[facility] INVASIVE CV LAB;  Service: Cardiovascular;  Laterality: N/A;   LEFT HEART CATHETERIZATION WITH CORONARY ANGIOGRAM N/A 04/04/2012   Procedure: LEFT HEART CATHETERIZATION WITH CORONARY ANGIOGRAM;  Surgeon: Lesleigh Noe, MD;  Location: [facility] CATH LAB;  Service: Cardiovascular;  Laterality: N/A;   PERCUTANEOUS CORONARY INTERVENTION-BALLOON ONLY Right 04/04/2012   Procedure: PERCUTANEOUS CORONARY INTERVENTION-BALLOON ONLY;  Surgeon: Lesleigh Noe, MD;  Location: [facility] CATH LAB;  Service: Cardiovascular;  Laterality: Right;   PERMANENT PACEMAKER INSERTION N/A 10/28/2011   Procedure: PERMANENT PACEMAKER INSERTION;  Surgeon: Marinus Maw, MD;  Location: [facility] CATH LAB;  Service: Cardiovascular;  Laterality: N/A;   PPM GENERATOR CHANGEOUT N/A 08/17/2022   Procedure: PPM GENERATOR CHANGEOUT;  Surgeon: Marinus Maw, MD;  Location: [facility] INVASIVE CV LAB;  Service: Cardiovascular;  Laterality: N/A;   TEE WITHOUT CARDIOVERSION  05/29/2012   Procedure: TRANSESOPHAGEAL ECHOCARDIOGRAM (TEE);  Surgeon: Quintella Reichert, MD;  Location: [facility] ENDOSCOPY;  Service: Cardiovascular;  Laterality: N/A;  h/p  in file drawer/dl    Current Outpatient Medications  Medication Sig Dispense Refill   apixaban (ELIQUIS) 5 MG TABS tablet Take 1 tablet (5 mg total) by mouth 2 (two) times daily. 180 tablet 1   Cholecalciferol (VITAMIN D3) 250 MCG (10000 UT) capsule Take 10,000 Units by mouth daily.     diclofenac Sodium (VOLTAREN) 1 % GEL Apply 1 Application topically 2 (two) times daily as  needed (joint pain). (Patient not taking: Reported on 05/27/2023)     levothyroxine (SYNTHROID) 100 MCG tablet TAKE 1 TABLET BY MOUTH EVERY DAY BEFORE BREAKFAST 90 tablet 2   lisinopril (ZESTRIL) 10 MG tablet TAKE 1 TABLET BY MOUTH DAILY 90 tablet 1   metoprolol succinate (TOPROL-XL) 50 MG 24 hr tablet TAKE 1 TABLET BY MOUTH DAILY WITH OR IMMEDIATELY FOLLOWING A MEAL 90 tablet 1   rosuvastatin (CRESTOR) 40 MG tablet Take 1 tablet (40 mg total) by mouth daily. 90 tablet 3   No current facility-administered medications for this visit.    Allergies:   Latex, Protonix [pantoprazole sodium], Codeine, and Tape   Social History:  The patient  reports that she quit smoking about 46 years ago. Her smoking use included cigarettes. She started smoking about 61 years ago. She has a 1.8 pack-year smoking history. She has never used smokeless tobacco. She reports that she does not drink alcohol and does not use drugs.   Family History:  The patient's family history includes Breast cancer in her mother; Heart attack (age of onset: 26) in her father; Heart attack (age of onset: 52) in her mother; Pancreatic cancer in her mother; Uterine cancer in her mother.  ROS:  Please see the history of present illness.    All other systems are reviewed and otherwise negative.   PHYSICAL EXAM:  VS:  There were no vitals taken for this visit. BMI: There is no height or weight on file to calculate BMI. Well nourished, well developed, in no acute distress HEENT: normocephalic, atraumatic Neck: no JVD, carotid bruits or masses Cardiac: ***  RRR; no significant murmurs, no rubs, or gallops Lungs:  *** CTA b/l, no wheezing, rhonchi or rales Abd: soft, nontender MS: no deformity or  atrophy Ext: ***  no edema Skin: warm and dry, no rash Neuro:  No gross deficits appreciated Psych: euthymic mood, full affect  *** PPM site: stable, no erythema, edema, fluctuation, tethering   EKG:  not done today  Device  interrogation done today and reviewed by myself:  *** Battery and lead measurements are good    02/08/2017: TTE Study Conclusions  - Left ventricle: The cavity size was normal. Wall thickness was    normal. Systolic function was vigorous. The estimated ejection    fraction was in the range of 65% to 70%. Wall motion was normal;    there were no regional wall motion abnormalities. Doppler    parameters are consistent with abnormal left ventricular    relaxation (grade 1 diastolic dysfunction). The E/e&' ratio is    between 8-15, suggesting indeterminate LV filling pressure.  - Left atrium: The atrium was normal in size.  - Right ventricle: Pacer wire or catheter noted in right ventricle.  - Right atrium: The atrium was normal in size. Pacer wire or    catheter noted in right atrium.  - Tricuspid valve: There was mild regurgitation.  - Pulmonary arteries: PA peak pressure: 33 mm Hg (S).  - Inferior vena cava: The vessel was normal in size. The  respirophasic diameter changes were in the normal range (>= 50%),    consistent with normal central venous pressure.   Impressions:  - Compared to a prior study in 2013, the LVEF is higher at 65-70%.    09/23/2015: EPS/ablation CONCLUSIONS: 1. Sinus rhythm upon presentation with salvos of atrial fibrillation occuring incessantly and arising from the right inferior pulmonary vein.   2. Successful electrical isolation and anatomical encircling of all four pulmonary veins with radiofrequency current using a WACA technique.   3. CTi block confirmed from a prior ablation procedure 4. No inducible arrhythmias following ablation both on and off of dobutamine 5. No early apparent complications.  Recent Labs: 04/18/2023: BUN 10; Creatinine, Ser 0.90; Hemoglobin 14.0; Platelets 302; Potassium 4.2; Sodium 144  04/18/2023: Chol/HDL Ratio 2.3; Cholesterol, Total 142; HDL 63; LDL Chol Calc (NIH) 63; Triglycerides 83   CrCl cannot be calculated (Patient's  most recent lab result is older than the maximum 21 days allowed.).   Wt Readings from Last 3 Encounters:  05/27/23 158 lb 9.6 oz (71.9 kg)  04/18/23 160 lb (72.6 kg)  11/25/22 160 lb 12.8 oz (72.9 kg)     Other studies reviewed: Additional studies/records reviewed today include: summarized above  ASSESSMENT AND PLAN:  PPM *** intact function *** no programming changes made  Paroxysmal Afib CHA2DS2Vasc is 4, on Eliquis, *** appropriately dosed *** % burden  HTN *** Looks good  4. CAD *** C/w Dr. Lalla Brothers    Disposition: *** Current medicines are reviewed at length with the patient today.  The patient did not have any concerns regarding medicines.  Norma Fredrickson, PA-C 11/07/2023 4:58 PM     [facility] [address] [place] [address] [phone] (office)  [phone] (fax)

## 2023-11-08 ENCOUNTER — Encounter: Payer: Self-pay | Admitting: Physician Assistant

## 2023-11-08 ENCOUNTER — Ambulatory Visit: Payer: Medicare Other | Attending: Cardiology | Admitting: Physician Assistant

## 2023-11-08 VITALS — BP 138/74 | HR 62 | Ht 63.0 in | Wt 153.0 lb

## 2023-11-08 DIAGNOSIS — I4819 Other persistent atrial fibrillation: Secondary | ICD-10-CM | POA: Diagnosis not present

## 2023-11-08 DIAGNOSIS — I2510 Atherosclerotic heart disease of native coronary artery without angina pectoris: Secondary | ICD-10-CM

## 2023-11-08 DIAGNOSIS — Z950 Presence of cardiac pacemaker: Secondary | ICD-10-CM | POA: Diagnosis not present

## 2023-11-08 DIAGNOSIS — I10 Essential (primary) hypertension: Secondary | ICD-10-CM

## 2023-11-08 LAB — CUP PACEART INCLINIC DEVICE CHECK
Battery Remaining Longevity: 105 mo
Battery Voltage: 3.01 V
Brady Statistic RA Percent Paced: 79 %
Brady Statistic RV Percent Paced: 0.04 %
Date Time Interrogation Session: 20250408184625
Implantable Lead Connection Status: 753985
Implantable Lead Connection Status: 753985
Implantable Lead Implant Date: 20130328
Implantable Lead Implant Date: 20130328
Implantable Lead Location: 753859
Implantable Lead Location: 753860
Implantable Pulse Generator Implant Date: 20240116
Lead Channel Impedance Value: 337.5 Ohm
Lead Channel Impedance Value: 337.5 Ohm
Lead Channel Pacing Threshold Amplitude: 0.5 V
Lead Channel Pacing Threshold Amplitude: 0.5 V
Lead Channel Pacing Threshold Amplitude: 1.5 V
Lead Channel Pacing Threshold Amplitude: 1.5 V
Lead Channel Pacing Threshold Pulse Width: 0.5 ms
Lead Channel Pacing Threshold Pulse Width: 0.5 ms
Lead Channel Pacing Threshold Pulse Width: 0.5 ms
Lead Channel Pacing Threshold Pulse Width: 0.5 ms
Lead Channel Sensing Intrinsic Amplitude: 1.8 mV
Lead Channel Sensing Intrinsic Amplitude: 6.1 mV
Lead Channel Setting Pacing Amplitude: 2 V
Lead Channel Setting Pacing Amplitude: 3 V
Lead Channel Setting Pacing Pulse Width: 0.5 ms
Lead Channel Setting Sensing Sensitivity: 2 mV
Pulse Gen Model: 2272
Pulse Gen Serial Number: 5680516

## 2023-11-08 NOTE — Patient Instructions (Signed)
 Medication Instructions:   Your physician recommends that you continue on your current medications as directed. Please refer to the Current Medication list given to you today.   *If you need a refill on your cardiac medications before your next appointment, please call your pharmacy*  Lab Work:  PLEASE GO DOWN STAIRS  [facility]  FIRST FLOOR  [address] ( GET OFF ELEVATORS MAKE A LEFT AND ANOTHER LEFT LAB ON RIGHT DOWN HALLWAY :  BMET AND CBC     If you have labs (blood work) drawn today and your tests are completely normal, you will receive your results only by: MyChart Message (if you have MyChart) OR A paper copy in the mail If you have any lab test that is abnormal or we need to change your treatment, we will call you to review the results.  Testing/Procedures:  NONE ORDERED  TODAY     Follow-Up: At [facility], you and your health needs are our priority.  As part of our continuing mission to provide you with exceptional heart care, our providers are all part of one team.  This team includes your primary Cardiologist (physician) and Advanced Practice Providers or APPs (Physician Assistants and Nurse Practitioners) who all work together to provide you with the care you need, when you need it.  Your next appointment:    1 year(s)  Provider:    You may see Dr. Ladona Ridgel  or one of the following Advanced Practice Providers on your designated Care Team:   Francis Dowse, [place]   We recommend signing up for the patient portal called "MyChart".  Sign up information is provided on this After Visit Summary.  MyChart is used to connect with patients for Virtual Visits (Telemedicine).  Patients are able to view lab/test results, encounter notes, upcoming appointments, etc.  Non-urgent messages can be sent to your provider as well.   To learn more about what you can do with MyChart, go to [email or web address].   Other Instructions       1st Floor: - Lobby - Registration  -  Pharmacy  - Lab - Cafe  2nd Floor: - PV Lab - Diagnostic Testing (echo, CT, nuclear med)  3rd Floor: - Vacant  4th Floor: - TCTS (cardiothoracic surgery) - AFib Clinic - Structural Heart Clinic - Vascular Surgery  - Vascular Ultrasound  5th Floor: - [facility]Care Cardiology (general and EP) - Clinical Pharmacy for coumadin, hypertension, lipid, weight-loss medications, and med management appointments    Valet parking services will be available as well.

## 2023-11-09 LAB — CBC
Hematocrit: 41.7 % (ref 34.0–46.6)
Hemoglobin: 13.6 g/dL (ref 11.1–15.9)
MCH: 27.9 pg (ref 26.6–33.0)
MCHC: 32.6 g/dL (ref 31.5–35.7)
MCV: 86 fL (ref 79–97)
Platelets: 316 10*3/uL (ref 150–450)
RBC: 4.88 x10E6/uL (ref 3.77–5.28)
RDW: 13.1 % (ref 11.7–15.4)
WBC: 6 10*3/uL (ref 3.4–10.8)

## 2023-11-09 LAB — MAGNESIUM: Magnesium: 1.9 mg/dL (ref 1.6–2.3)

## 2023-11-10 ENCOUNTER — Other Ambulatory Visit: Payer: Self-pay | Admitting: *Deleted

## 2023-11-10 DIAGNOSIS — I4819 Other persistent atrial fibrillation: Secondary | ICD-10-CM

## 2023-11-10 NOTE — Addendum Note (Signed)
 Addended by: Oleta Mouse on: 11/10/2023 04:50 PM   Modules accepted: Orders

## 2023-11-16 ENCOUNTER — Ambulatory Visit (INDEPENDENT_AMBULATORY_CARE_PROVIDER_SITE_OTHER): Payer: Medicare HMO

## 2023-11-16 DIAGNOSIS — I495 Sick sinus syndrome: Secondary | ICD-10-CM

## 2023-11-17 LAB — CUP PACEART REMOTE DEVICE CHECK
Battery Remaining Longevity: 85 mo
Battery Remaining Percentage: 92 %
Battery Voltage: 3.01 V
Brady Statistic AP VP Percent: 1 %
Brady Statistic AP VS Percent: 81 %
Brady Statistic AS VP Percent: 1 %
Brady Statistic AS VS Percent: 19 %
Brady Statistic RA Percent Paced: 79 %
Brady Statistic RV Percent Paced: 1 %
Date Time Interrogation Session: 20250416033356
Implantable Lead Connection Status: 753985
Implantable Lead Connection Status: 753985
Implantable Lead Implant Date: 20130328
Implantable Lead Implant Date: 20130328
Implantable Lead Location: 753859
Implantable Lead Location: 753860
Implantable Pulse Generator Implant Date: 20240116
Lead Channel Impedance Value: 330 Ohm
Lead Channel Impedance Value: 340 Ohm
Lead Channel Pacing Threshold Amplitude: 0.5 V
Lead Channel Pacing Threshold Amplitude: 1.5 V
Lead Channel Pacing Threshold Pulse Width: 0.5 ms
Lead Channel Pacing Threshold Pulse Width: 0.5 ms
Lead Channel Sensing Intrinsic Amplitude: 2 mV
Lead Channel Sensing Intrinsic Amplitude: 5.8 mV
Lead Channel Setting Pacing Amplitude: 2 V
Lead Channel Setting Pacing Amplitude: 3 V
Lead Channel Setting Pacing Pulse Width: 0.5 ms
Lead Channel Setting Sensing Sensitivity: 2 mV
Pulse Gen Model: 2272
Pulse Gen Serial Number: 5680516

## 2023-11-18 ENCOUNTER — Encounter: Payer: Self-pay | Admitting: Internal Medicine

## 2023-12-14 DIAGNOSIS — E039 Hypothyroidism, unspecified: Secondary | ICD-10-CM | POA: Diagnosis not present

## 2023-12-14 DIAGNOSIS — E785 Hyperlipidemia, unspecified: Secondary | ICD-10-CM | POA: Diagnosis not present

## 2023-12-14 DIAGNOSIS — I10 Essential (primary) hypertension: Secondary | ICD-10-CM | POA: Diagnosis not present

## 2023-12-14 DIAGNOSIS — I4891 Unspecified atrial fibrillation: Secondary | ICD-10-CM | POA: Diagnosis not present

## 2023-12-28 NOTE — Progress Notes (Signed)
 Remote pacemaker transmission.

## 2023-12-28 NOTE — Addendum Note (Signed)
 Addended by: Lott Rouleau A on: 12/28/2023 03:36 PM   Modules accepted: Orders

## 2023-12-31 DIAGNOSIS — E039 Hypothyroidism, unspecified: Secondary | ICD-10-CM | POA: Diagnosis not present

## 2023-12-31 DIAGNOSIS — I4891 Unspecified atrial fibrillation: Secondary | ICD-10-CM | POA: Diagnosis not present

## 2023-12-31 DIAGNOSIS — I2510 Atherosclerotic heart disease of native coronary artery without angina pectoris: Secondary | ICD-10-CM | POA: Diagnosis not present

## 2023-12-31 DIAGNOSIS — I10 Essential (primary) hypertension: Secondary | ICD-10-CM | POA: Diagnosis not present

## 2024-01-17 DIAGNOSIS — H2513 Age-related nuclear cataract, bilateral: Secondary | ICD-10-CM | POA: Diagnosis not present

## 2024-01-17 DIAGNOSIS — H40033 Anatomical narrow angle, bilateral: Secondary | ICD-10-CM | POA: Diagnosis not present

## 2024-01-30 DIAGNOSIS — I4891 Unspecified atrial fibrillation: Secondary | ICD-10-CM | POA: Diagnosis not present

## 2024-01-30 DIAGNOSIS — E039 Hypothyroidism, unspecified: Secondary | ICD-10-CM | POA: Diagnosis not present

## 2024-01-30 DIAGNOSIS — I10 Essential (primary) hypertension: Secondary | ICD-10-CM | POA: Diagnosis not present

## 2024-01-30 DIAGNOSIS — I2510 Atherosclerotic heart disease of native coronary artery without angina pectoris: Secondary | ICD-10-CM | POA: Diagnosis not present

## 2024-02-15 ENCOUNTER — Ambulatory Visit: Payer: Medicare HMO

## 2024-02-15 DIAGNOSIS — I495 Sick sinus syndrome: Secondary | ICD-10-CM

## 2024-02-16 LAB — CUP PACEART REMOTE DEVICE CHECK
Battery Remaining Longevity: 83 mo
Battery Remaining Percentage: 89 %
Battery Voltage: 3.01 V
Brady Statistic AP VP Percent: 1 %
Brady Statistic AP VS Percent: 78 %
Brady Statistic AS VP Percent: 1 %
Brady Statistic AS VS Percent: 22 %
Brady Statistic RA Percent Paced: 76 %
Brady Statistic RV Percent Paced: 1 %
Date Time Interrogation Session: 20250716020014
Implantable Lead Connection Status: 753985
Implantable Lead Connection Status: 753985
Implantable Lead Implant Date: 20130328
Implantable Lead Implant Date: 20130328
Implantable Lead Location: 753859
Implantable Lead Location: 753860
Implantable Pulse Generator Implant Date: 20240116
Lead Channel Impedance Value: 330 Ohm
Lead Channel Impedance Value: 360 Ohm
Lead Channel Pacing Threshold Amplitude: 0.5 V
Lead Channel Pacing Threshold Amplitude: 1.5 V
Lead Channel Pacing Threshold Pulse Width: 0.5 ms
Lead Channel Pacing Threshold Pulse Width: 0.5 ms
Lead Channel Sensing Intrinsic Amplitude: 2.7 mV
Lead Channel Sensing Intrinsic Amplitude: 5.6 mV
Lead Channel Setting Pacing Amplitude: 2 V
Lead Channel Setting Pacing Amplitude: 3 V
Lead Channel Setting Pacing Pulse Width: 0.5 ms
Lead Channel Setting Sensing Sensitivity: 2 mV
Pulse Gen Model: 2272
Pulse Gen Serial Number: 5680516

## 2024-02-17 ENCOUNTER — Other Ambulatory Visit: Payer: Self-pay | Admitting: *Deleted

## 2024-02-17 DIAGNOSIS — H2512 Age-related nuclear cataract, left eye: Secondary | ICD-10-CM | POA: Diagnosis not present

## 2024-02-17 DIAGNOSIS — I4819 Other persistent atrial fibrillation: Secondary | ICD-10-CM

## 2024-02-17 DIAGNOSIS — I10 Essential (primary) hypertension: Secondary | ICD-10-CM | POA: Diagnosis not present

## 2024-02-17 DIAGNOSIS — H25043 Posterior subcapsular polar age-related cataract, bilateral: Secondary | ICD-10-CM | POA: Diagnosis not present

## 2024-02-17 DIAGNOSIS — H2513 Age-related nuclear cataract, bilateral: Secondary | ICD-10-CM | POA: Diagnosis not present

## 2024-02-17 DIAGNOSIS — H25013 Cortical age-related cataract, bilateral: Secondary | ICD-10-CM | POA: Diagnosis not present

## 2024-02-17 MED ORDER — APIXABAN 5 MG PO TABS: 5.0000 mg | ORAL_TABLET | Freq: Two times a day (BID) | ORAL | 1 refills | Status: DC

## 2024-02-17 NOTE — Telephone Encounter (Signed)
 Eliquis  5mg  refill request received. Patient is 78 years old, weight-69.4kg, Crea-0.90 on 04/18/23, Diagnosis-Afib, and last seen by [name] on 11/08/23. Dose is appropriate based on dosing criteria. Will send in refill to requested pharmacy.

## 2024-02-20 ENCOUNTER — Ambulatory Visit: Payer: Self-pay | Admitting: Internal Medicine

## 2024-03-01 DIAGNOSIS — I2510 Atherosclerotic heart disease of native coronary artery without angina pectoris: Secondary | ICD-10-CM | POA: Diagnosis not present

## 2024-03-01 DIAGNOSIS — I10 Essential (primary) hypertension: Secondary | ICD-10-CM | POA: Diagnosis not present

## 2024-03-01 DIAGNOSIS — E039 Hypothyroidism, unspecified: Secondary | ICD-10-CM | POA: Diagnosis not present

## 2024-03-01 DIAGNOSIS — I4891 Unspecified atrial fibrillation: Secondary | ICD-10-CM | POA: Diagnosis not present

## 2024-04-01 DIAGNOSIS — I4891 Unspecified atrial fibrillation: Secondary | ICD-10-CM | POA: Diagnosis not present

## 2024-04-01 DIAGNOSIS — I2510 Atherosclerotic heart disease of native coronary artery without angina pectoris: Secondary | ICD-10-CM | POA: Diagnosis not present

## 2024-04-01 DIAGNOSIS — I10 Essential (primary) hypertension: Secondary | ICD-10-CM | POA: Diagnosis not present

## 2024-04-01 DIAGNOSIS — E039 Hypothyroidism, unspecified: Secondary | ICD-10-CM | POA: Diagnosis not present

## 2024-04-13 ENCOUNTER — Encounter: Payer: Self-pay | Admitting: Cardiology

## 2024-04-30 ENCOUNTER — Other Ambulatory Visit: Payer: Self-pay | Admitting: Cardiology

## 2024-04-30 DIAGNOSIS — I119 Hypertensive heart disease without heart failure: Secondary | ICD-10-CM

## 2024-05-01 DIAGNOSIS — I10 Essential (primary) hypertension: Secondary | ICD-10-CM | POA: Diagnosis not present

## 2024-05-01 DIAGNOSIS — I2510 Atherosclerotic heart disease of native coronary artery without angina pectoris: Secondary | ICD-10-CM | POA: Diagnosis not present

## 2024-05-01 DIAGNOSIS — I4891 Unspecified atrial fibrillation: Secondary | ICD-10-CM | POA: Diagnosis not present

## 2024-05-01 DIAGNOSIS — E039 Hypothyroidism, unspecified: Secondary | ICD-10-CM | POA: Diagnosis not present

## 2024-05-01 MED ORDER — ROSUVASTATIN CALCIUM 40 MG PO TABS: 40.0000 mg | ORAL_TABLET | Freq: Every day | ORAL | 0 refills | Status: DC

## 2024-05-08 NOTE — Progress Notes (Signed)
 Remote PPM Transmission

## 2024-05-09 ENCOUNTER — Telehealth (HOSPITAL_BASED_OUTPATIENT_CLINIC_OR_DEPARTMENT_OTHER): Payer: Self-pay | Admitting: *Deleted

## 2024-05-09 ENCOUNTER — Encounter: Payer: Self-pay | Admitting: Internal Medicine

## 2024-05-09 NOTE — Telephone Encounter (Deleted)
   Patient Name: Cheryl [name]  DOB: November 16, 1945 MRN: [id number]  Primary Cardiologist: Wilbert Bihari, MD  Chart reviewed as part of pre-operative protocol coverage. Cataract extractions are recognized in guidelines as low risk surgeries that do not typically require specific preoperative testing or holding of blood thinner therapy. Therefore, given past medical history and time since last visit, based on ACC/AHA guidelines, [name] would be at acceptable risk for the planned procedure without further cardiovascular testing.   I will route this recommendation to the requesting party via Epic fax function and remove from pre-op pool.  Please call with questions.  Josefa CHRISTELLA Beauvais, NP 05/09/2024, 3:56 PM

## 2024-05-09 NOTE — Telephone Encounter (Signed)
   Patient Name: Cheryl [name]  DOB: 06/17/1946 MRN: [id number]  Primary Cardiologist: Wilbert Bihari, MD  Chart reviewed as part of pre-operative protocol coverage. Cataract extractions are recognized in guidelines as low risk surgeries that do not typically require specific preoperative testing or holding of blood thinner therapy. Therefore, given past medical history and time since last visit, based on ACC/AHA guidelines, Cheryl [name] would be at acceptable risk for the planned procedure on 05/17/2024 and 06/07/2024 without further cardiovascular testing.   Device clinic to answer separately.   I will route this recommendation to the requesting party via Epic fax function and remove from pre-op pool.  Please call with questions.  Lamarr Satterfield, NP 05/09/2024, 3:55 PM

## 2024-05-09 NOTE — Telephone Encounter (Signed)
   Pre-operative Risk Assessment    Patient Name: Cheryl [name]  DOB: 1946/07/01 MRN: [id number]   Date of last office visit: 11/08/23 Cheryl [name], [facility] Date of next office visit: 08/07/24 DR. TURNER   Request for Surgical Clearance    Procedure:  CATARACT EXTRACTION WITH INTRAOCULAR LEN IMPLANT OF THE LEFT EYE TO BE DONE ON 05/17/24; THE RIGHT EYE WILL FOLLOW TO BE DONE ON 06/07/24  Date of Surgery:  Clearance 05/17/24 LEFT EYE; FOLLOWED BY THE RIGHT EYE TO BE DONE ON 06/07/24                              Surgeon:  DR. LISA SUN Surgeon's Group or Practice Name: [facility] Phone number:  [phone] Fax number:  [phone]   Type of Clearance Requested:   - Medical L NONE INDICATED ON FORM TO BE HELD  WILL NEED DEVICE CLEARANCE AS WELL     Type of Anesthesia:  TOPICAL WITH IV MEDICATIONS   Additional requests/questions:    Cheryl [name]   05/09/2024, 3:39 PM

## 2024-05-09 NOTE — Progress Notes (Signed)
 PERIOPERATIVE PRESCRIPTION FOR IMPLANTED CARDIAC DEVICE PROGRAMMING  Patient Information: Name:  Cheryl [name]  DOB:  [birth year]  MRN:  [id number]   Request for Surgical Clearance    Procedure:  CATARACT EXTRACTION WITH INTRAOCULAR LEN IMPLANT OF THE LEFT EYE TO BE DONE ON 05/17/24; THE RIGHT EYE WILL FOLLOW TO BE DONE ON 06/07/24   Date of Surgery:  Clearance 05/17/24 LEFT EYE; FOLLOWED BY THE RIGHT EYE TO BE DONE ON 06/07/24                                Surgeon:  DR. LISA SUN Surgeon's Group or Practice Name: [facility] Phone number:  [phone] Fax number:  [phone]   Type of Clearance Requested:   - Medical L NONE INDICATED ON FORM TO BE HELD   WILL NEED DEVICE CLEARANCE AS WELL      Type of Anesthesia:  TOPICAL WITH IV MEDICATIONS   Device Information:  Clinic EP Physician:  Danelle Birmingham, MD   Device Type:  Pacemaker Manufacturer and Phone #:  St. Jude/Abbott: [phone] Pacemaker Dependent?:  No. Date of Last Device Check:  02/15/24 Remote  11/08/23 In office  Normal Device Function?:  Yes.    Electrophysiologist's Recommendations:  Have magnet available. Provide continuous ECG monitoring when magnet is used or reprogramming is to be performed.  Procedure may interfere with device function.  Magnet should be placed over device during procedure.  Per Device Clinic Standing Orders, Alan JAYSON Fees, RN  4:19 PM 05/09/2024

## 2024-05-09 NOTE — Telephone Encounter (Signed)
 See separate device clearance in EPIC.

## 2024-05-16 ENCOUNTER — Ambulatory Visit (INDEPENDENT_AMBULATORY_CARE_PROVIDER_SITE_OTHER): Payer: Medicare HMO

## 2024-05-16 DIAGNOSIS — I4819 Other persistent atrial fibrillation: Secondary | ICD-10-CM

## 2024-05-17 ENCOUNTER — Ambulatory Visit: Payer: Self-pay | Admitting: Internal Medicine

## 2024-05-17 DIAGNOSIS — H2513 Age-related nuclear cataract, bilateral: Secondary | ICD-10-CM | POA: Diagnosis not present

## 2024-05-17 DIAGNOSIS — H2512 Age-related nuclear cataract, left eye: Secondary | ICD-10-CM | POA: Diagnosis not present

## 2024-05-17 DIAGNOSIS — Z961 Presence of intraocular lens: Secondary | ICD-10-CM | POA: Diagnosis not present

## 2024-05-17 LAB — CUP PACEART REMOTE DEVICE CHECK
Battery Remaining Longevity: 82 mo
Battery Remaining Percentage: 87 %
Battery Voltage: 3.01 V
Brady Statistic AP VP Percent: 1 %
Brady Statistic AP VS Percent: 76 %
Brady Statistic AS VP Percent: 1 %
Brady Statistic AS VS Percent: 24 %
Brady Statistic RA Percent Paced: 75 %
Brady Statistic RV Percent Paced: 1 %
Date Time Interrogation Session: 20251015045648
Implantable Lead Connection Status: 753985
Implantable Lead Connection Status: 753985
Implantable Lead Implant Date: 20130328
Implantable Lead Implant Date: 20130328
Implantable Lead Location: 753859
Implantable Lead Location: 753860
Implantable Pulse Generator Implant Date: 20240116
Lead Channel Impedance Value: 350 Ohm
Lead Channel Impedance Value: 360 Ohm
Lead Channel Pacing Threshold Amplitude: 0.5 V
Lead Channel Pacing Threshold Amplitude: 1.5 V
Lead Channel Pacing Threshold Pulse Width: 0.5 ms
Lead Channel Pacing Threshold Pulse Width: 0.5 ms
Lead Channel Sensing Intrinsic Amplitude: 2 mV
Lead Channel Sensing Intrinsic Amplitude: 5.8 mV
Lead Channel Setting Pacing Amplitude: 2 V
Lead Channel Setting Pacing Amplitude: 3 V
Lead Channel Setting Pacing Pulse Width: 0.5 ms
Lead Channel Setting Sensing Sensitivity: 2 mV
Pulse Gen Model: 2272
Pulse Gen Serial Number: 5680516

## 2024-05-18 DIAGNOSIS — H25041 Posterior subcapsular polar age-related cataract, right eye: Secondary | ICD-10-CM | POA: Diagnosis not present

## 2024-05-18 DIAGNOSIS — H25011 Cortical age-related cataract, right eye: Secondary | ICD-10-CM | POA: Diagnosis not present

## 2024-05-18 DIAGNOSIS — H2511 Age-related nuclear cataract, right eye: Secondary | ICD-10-CM | POA: Diagnosis not present

## 2024-05-21 NOTE — Progress Notes (Signed)
 Remote PPM Transmission

## 2024-06-01 DIAGNOSIS — E039 Hypothyroidism, unspecified: Secondary | ICD-10-CM | POA: Diagnosis not present

## 2024-06-01 DIAGNOSIS — I4891 Unspecified atrial fibrillation: Secondary | ICD-10-CM | POA: Diagnosis not present

## 2024-06-01 DIAGNOSIS — I10 Essential (primary) hypertension: Secondary | ICD-10-CM | POA: Diagnosis not present

## 2024-06-01 DIAGNOSIS — I2510 Atherosclerotic heart disease of native coronary artery without angina pectoris: Secondary | ICD-10-CM | POA: Diagnosis not present

## 2024-06-11 ENCOUNTER — Other Ambulatory Visit: Payer: Self-pay | Admitting: Cardiology

## 2024-06-11 DIAGNOSIS — I119 Hypertensive heart disease without heart failure: Secondary | ICD-10-CM

## 2024-06-20 DIAGNOSIS — Z1331 Encounter for screening for depression: Secondary | ICD-10-CM | POA: Diagnosis not present

## 2024-06-20 DIAGNOSIS — Z0000 Encounter for general adult medical examination without abnormal findings: Secondary | ICD-10-CM | POA: Diagnosis not present

## 2024-06-26 DIAGNOSIS — H1013 Acute atopic conjunctivitis, bilateral: Secondary | ICD-10-CM | POA: Diagnosis not present

## 2024-07-01 DIAGNOSIS — I10 Essential (primary) hypertension: Secondary | ICD-10-CM | POA: Diagnosis not present

## 2024-07-01 DIAGNOSIS — I2510 Atherosclerotic heart disease of native coronary artery without angina pectoris: Secondary | ICD-10-CM | POA: Diagnosis not present

## 2024-07-01 DIAGNOSIS — E039 Hypothyroidism, unspecified: Secondary | ICD-10-CM | POA: Diagnosis not present

## 2024-07-01 DIAGNOSIS — I4891 Unspecified atrial fibrillation: Secondary | ICD-10-CM | POA: Diagnosis not present

## 2024-08-03 ENCOUNTER — Other Ambulatory Visit: Payer: Self-pay | Admitting: Cardiology

## 2024-08-03 DIAGNOSIS — I4819 Other persistent atrial fibrillation: Secondary | ICD-10-CM

## 2024-08-03 NOTE — Telephone Encounter (Signed)
 Eliquis  5mg  refill request received. Patient is 79 years old, weight-69.4kg, Crea-0.79 on 12/15/23 via [facility] from [place], [place], and last seen by [name] on 11/08/23. Dose is appropriate based on dosing criteria. Will send in refill to requested pharmacy.

## 2024-08-06 NOTE — Progress Notes (Signed)
 " Cardiology Office Note:    Date:  08/07/2024   ID:  Cheryl [name], DOB [birth year], MRN [id number]  PCP:  Cleotilde Planas, MD  Cardiologist:  Wilbert Bihari, MD    Referring MD: Cleotilde Planas, MD   Chief Complaint  Patient presents with   Coronary Artery Disease   Hypertension   Hyperlipidemia   Atrial Fibrillation    History of Present Illness:    Cheryl [name] is a 79 y.o. female with a hx of PAF/flutter s/p ablation x 2, tachy/brady syndrome s/p PPM, HTN, ASCAD s/p PCI of OM and dyslipidemia.  She is here today for followup and is doing well.  She denies any CP or pressure, SOB, DOE, PND, orthopnea, LE edema (unless she eats too much Na), dizziness, palpitations or syncope.  Past Medical History:  Diagnosis Date   Anemia    Arthritis    Blood transfusion ~ 1962    couple; no reaction  (05/30/2012)   Coronary artery disease 2013   PCI PCI of OM   Depression    GERD (gastroesophageal reflux disease)    H/O hiatal hernia    HTN (hypertension)    Hypercholesteremia    Hypothyroidism    Myocardial infarction (HCC)    very very light (05/30/2012)   PAF (paroxysmal atrial fibrillation) (HCC) 05/30/2012   s/p afib/flutter ablation 2013/2017   Tachy-brady syndrome (HCC) 10/2011   a. post-termination pauses in setting of PAF;  b. 10/28/11 - [employer] Dual Chamber PPM SN [id number]    Ureteral obstruction, right     Past Surgical History:  Procedure Laterality Date   ATRIAL FIBRILLATION ABLATION N/A 05/30/2012   PVI and CTi ablation by Dr Kelsie   BREAST SURGERY  1990's   right radial scar (05/30/2012)   CORONARY ANGIOPLASTY  ~ 04/2012   CYSTOSCOPY WITH RETROGRADE PYELOGRAM, URETEROSCOPY AND STENT PLACEMENT  08/21/2012   Procedure: CYSTOSCOPY WITH RETROGRADE PYELOGRAM, URETEROSCOPY AND STENT PLACEMENT;  Surgeon: Garnette Shack, MD;  Location: [facility];  Service: Urology;  Laterality: Right;  URETEROSCOPY WITH BIOPSY    ELECTROPHYSIOLOGIC STUDY N/A 09/23/2015   Procedure: Atrial  Fibrillation Ablation;  Surgeon: Lynwood Kelsie, MD;  Location: [facility] INVASIVE CV LAB;  Service: Cardiovascular;  Laterality: N/A;   LEFT HEART CATHETERIZATION WITH CORONARY ANGIOGRAM N/A 04/04/2012   Procedure: LEFT HEART CATHETERIZATION WITH CORONARY ANGIOGRAM;  Surgeon: Victory LELON Claudene DOUGLAS, MD;  Location: [facility] CATH LAB;  Service: Cardiovascular;  Laterality: N/A;   PERCUTANEOUS CORONARY INTERVENTION-BALLOON ONLY Right 04/04/2012   Procedure: PERCUTANEOUS CORONARY INTERVENTION-BALLOON ONLY;  Surgeon: Victory LELON Claudene DOUGLAS, MD;  Location: [facility] CATH LAB;  Service: Cardiovascular;  Laterality: Right;   PERMANENT PACEMAKER INSERTION N/A 10/28/2011   Procedure: PERMANENT PACEMAKER INSERTION;  Surgeon: Danelle LELON [place], MD;  Location: [facility] CATH LAB;  Service: Cardiovascular;  Laterality: N/A;   PPM GENERATOR CHANGEOUT N/A 08/17/2022   Procedure: PPM GENERATOR CHANGEOUT;  Surgeon: Birmingham Danelle LELON, MD;  Location: [facility] INVASIVE CV LAB;  Service: Cardiovascular;  Laterality: N/A;   TEE WITHOUT CARDIOVERSION  05/29/2012   Procedure: TRANSESOPHAGEAL ECHOCARDIOGRAM (TEE);  Surgeon: Wilbert JONELLE Bihari, MD;  Location: [facility] ENDOSCOPY;  Service: Cardiovascular;  Laterality: N/A;  h/p in file drawer/dl    Current Medications: Current Meds  Medication Sig   Cholecalciferol (VITAMIN D3) 250 MCG (10000 UT) capsule Take 10,000 Units by mouth daily.   diclofenac Sodium (VOLTAREN) 1 % GEL Apply 1 Application topically 2 (two) times daily as needed (joint pain).   ELIQUIS  5  MG TABS tablet TAKE 1 TABLET BY MOUTH 2 TIMES DAILY   levothyroxine  (SYNTHROID ) 100 MCG tablet TAKE 1 TABLET BY MOUTH EVERY DAY BEFORE BREAKFAST   lisinopril  (ZESTRIL ) 10 MG tablet TAKE 1 TABLET BY MOUTH DAILY   metoprolol  succinate (TOPROL -XL) 50 MG 24 hr tablet TAKE 1 TABLET BY MOUTH DAILY WITH OR IMMEDIATELY FOLLOWING A MEAL   rosuvastatin  (CRESTOR ) 40 MG tablet TAKE 1 TABLET BY MOUTH DAILY     Allergies:   Latex, Protonix  [pantoprazole  sodium], Codeine, and Tape   Social  History   Socioeconomic History   Marital status: Married    Spouse name: Not on file   Number of children: 4   Years of education: Not on file   Highest education level: Not on file  Occupational History   Not on file  Tobacco Use   Smoking status: Former    Current packs/day: 0.00    Average packs/day: 0.1 packs/day for 15.0 years (1.8 ttl pk-yrs)    Types: Cigarettes    Start date: 06/17/1962    Quit date: 06/17/1977    Years since quitting: 47.1   Smokeless tobacco: Never   Tobacco comments:    QUIT smoking cigarettes 1978  Vaping Use   Vaping status: Never Used  Substance and Sexual Activity   Alcohol use: No   Drug use: No   Sexual activity: Never    Birth control/protection: Post-menopausal  Other Topics Concern   Not on file  Social History Narrative   Lives with husband.  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   Social Drivers of Health   Tobacco Use: Medium Risk (08/07/2024)   Patient History    Smoking Tobacco Use: Former    Smokeless Tobacco Use: Never    Passive Exposure: Not on Actuary Strain: Not on file  Food Insecurity: Not on file  Transportation Needs: Not on file  Physical Activity: Not on file  Stress: Not on file  Social Connections: Not on file  Depression (EYV7-0): Not on file  Alcohol Screen: Not on file  Housing: Not on file  Utilities: Not on file  Health Literacy: Not on file     Family History: The patient's family history includes Breast cancer in her mother; Heart attack (age of onset: 58) in her father; Heart attack (age of onset: 20) in her mother; Pancreatic cancer in her mother; Uterine cancer in her mother.  ROS:   Please see the history of present illness.    Review of Systems  Musculoskeletal:  Negative for muscle weakness.    All other systems reviewed and negative.   EKGs/Labs/Other Studies Reviewed:    The following studies were reviewed today: PAP compliance download  EKG  Interpretation Date/Time:  Tuesday August 07 2024 11:08:09 EST Ventricular Rate:  63 PR Interval:    QRS Duration:  78 QT Interval:  422 QTC Calculation: 431 R Axis:   11  Text Interpretation: Atrial-paced rhythm Nonspecific T wave abnormality When compared with ECG of 27-May-2023 16:34, Electronic atrial pacemaker has replaced Sinus rhythm Nonspecific T wave abnormality now evident in Anterior leads Confirmed by Shlomo Corning ([id number]) on 08/07/2024 11:18:15 AM    Recent Labs: 11/08/2023: Hemoglobin 13.6; Magnesium 1.9; Platelets 316   Recent Lipid Panel    Component Value Date/Time   CHOL 142 04/18/2023 1202   TRIG 83 04/18/2023 1202   HDL 63 04/18/2023 1202   CHOLHDL 2.3 04/18/2023 1202   CHOLHDL 2.0 05/21/2016 1100   VLDL 12  05/21/2016 1100   LDLCALC 63 04/18/2023 1202    Physical Exam:    VS:  BP 112/72 (BP Location: Left Arm, Patient Position: Sitting)   Pulse 63   Ht 5' 3 (1.6 m)   Wt 155 lb 12.8 oz (70.7 kg)   SpO2 97%   BMI 27.60 kg/m     Wt Readings from Last 3 Encounters:  08/07/24 155 lb 12.8 oz (70.7 kg)  11/08/23 153 lb (69.4 kg)  05/27/23 158 lb 9.6 oz (71.9 kg)    GEN: Well nourished, well developed in no acute distress HEENT: Normal NECK: No JVD; No carotid bruits LYMPHATICS: No lymphadenopathy CARDIAC:RRR, no murmurs, rubs, gallops RESPIRATORY:  Clear to auscultation without rales, wheezing or rhonchi  ABDOMEN: Soft, non-tender, non-distended MUSCULOSKELETAL:  No edema; No deformity  SKIN: Warm and dry NEUROLOGIC:  Alert and oriented x 3 PSYCHIATRIC:  Normal affect  ASSESSMENT:    1. Coronary artery disease involving native coronary artery of native heart without angina pectoris   2. Persistent atrial fibrillation (HCC)   3. Hyperlipidemia, unspecified hyperlipidemia type   4. Essential hypertension   5. Tachy-brady syndrome (HCC)     PLAN:    In order of problems listed above:  1.  ASCAD  -s/p cath in 2013 with  PCI of OM.  -She has  not had any anginal sx since I saw her last -Continue Toprol  XL 50mg  daily and Crestor  40mg  daily with PRN refills -She is not on aspirin  due to DOAC therapy   2.  Persistent atrial fibrillation  -s/p ablation x 2 -remains in NSR and denies any palpitations -No bleeding on DOAC -EKG today shows Atrial paced rhythm -Continue Toprol  XL 50mg  daily and Apixaban  5mg  BID with PRN refills -Check c-Met and CBC  3.  Hyperlipidemia  -her LDL goal is < 70.   -continue Crestor  40mg  daily with PRN refils -I have personally reviewed and interpreted outside labs performed by patient's PCP which showed LDL 74 and HDL 67 on 12/14/2023 -check FLP and ALT   4.  Hypertension  -BP controlled on exam today -Continue Toprol  XL 50mg  daily and Lisinopril  10mg  daily with PRN refills  5.  Tachy-Brady syndrome  - she is s/p PPM which is followed in our device clinic.   Followup with me in 1 year   Medication Adjustments/Labs and Tests Ordered: Current medicines are reviewed at length with the patient today.  Concerns regarding medicines are outlined above.  Orders Placed This Encounter  Procedures   EKG 12-Lead   No orders of the defined types were placed in this encounter.   Signed, Wilbert Bihari, MD  08/07/2024 11:16 AM    [facility] "

## 2024-08-07 ENCOUNTER — Ambulatory Visit: Attending: Cardiology | Admitting: Cardiology

## 2024-08-07 ENCOUNTER — Encounter: Payer: Self-pay | Admitting: Cardiology

## 2024-08-07 VITALS — BP 112/72 | HR 63 | Ht 63.0 in | Wt 155.8 lb

## 2024-08-07 DIAGNOSIS — E785 Hyperlipidemia, unspecified: Secondary | ICD-10-CM | POA: Diagnosis not present

## 2024-08-07 DIAGNOSIS — I10 Essential (primary) hypertension: Secondary | ICD-10-CM | POA: Diagnosis not present

## 2024-08-07 DIAGNOSIS — I119 Hypertensive heart disease without heart failure: Secondary | ICD-10-CM | POA: Diagnosis not present

## 2024-08-07 DIAGNOSIS — I495 Sick sinus syndrome: Secondary | ICD-10-CM

## 2024-08-07 DIAGNOSIS — I4819 Other persistent atrial fibrillation: Secondary | ICD-10-CM | POA: Diagnosis not present

## 2024-08-07 DIAGNOSIS — I2510 Atherosclerotic heart disease of native coronary artery without angina pectoris: Secondary | ICD-10-CM

## 2024-08-07 DIAGNOSIS — Z79899 Other long term (current) drug therapy: Secondary | ICD-10-CM

## 2024-08-07 LAB — LIPID PANEL

## 2024-08-07 MED ORDER — APIXABAN 5 MG PO TABS: 5.0000 mg | ORAL_TABLET | Freq: Two times a day (BID) | ORAL | 3 refills | Status: AC

## 2024-08-07 MED ORDER — METOPROLOL SUCCINATE ER 50 MG PO TB24: ORAL_TABLET | ORAL | 3 refills | Status: AC

## 2024-08-07 MED ORDER — LISINOPRIL 10 MG PO TABS: 10.0000 mg | ORAL_TABLET | Freq: Every day | ORAL | 3 refills | Status: AC

## 2024-08-07 MED ORDER — ROSUVASTATIN CALCIUM 40 MG PO TABS: 40.0000 mg | ORAL_TABLET | Freq: Every day | ORAL | 3 refills | Status: AC

## 2024-08-07 NOTE — Patient Instructions (Signed)
 Medication Instructions:  Your physician recommends that you continue on your current medications as directed. Please refer to the Current Medication list given to you today.  *If you need a refill on your cardiac medications before your next appointment, please call your pharmacy*  Lab Work: Please complete a CBC/CMET/FLP today in our first floor lab before you leave today.  If you have labs (blood work) drawn today and your tests are completely normal, you will receive your results only by: MyChart Message (if you have MyChart) OR A paper copy in the mail If you have any lab test that is abnormal or we need to change your treatment, we will call you to review the results.  Testing/Procedures: None.   Follow-Up: At [facility], you and your health needs are our priority.  As part of our continuing mission to provide you with exceptional heart care, our providers are all part of one team.  This team includes your primary Cardiologist (physician) and Advanced Practice Providers or APPs (Physician Assistants and Nurse Practitioners) who all work together to provide you with the care you need, when you need it.  Your next appointment:   1 year(s)  Provider:   Wilbert Bihari, MD

## 2024-08-07 NOTE — Addendum Note (Signed)
 Addended by: JANIT GENI CROME on: 08/07/2024 11:28 AM   Modules accepted: Orders

## 2024-08-08 LAB — COMPREHENSIVE METABOLIC PANEL WITH GFR
ALT: 8 IU/L (ref 0–32)
AST: 18 IU/L (ref 0–40)
Albumin: 4.4 g/dL (ref 3.8–4.8)
Alkaline Phosphatase: 21 IU/L — AB (ref 49–135)
BUN/Creatinine Ratio: 17 (ref 12–28)
BUN: 14 mg/dL (ref 8–27)
Bilirubin Total: 0.5 mg/dL (ref 0.0–1.2)
CO2: 26 mmol/L (ref 20–29)
Calcium: 9.5 mg/dL (ref 8.7–10.3)
Chloride: 100 mmol/L (ref 96–106)
Creatinine, Ser: 0.83 mg/dL (ref 0.57–1.00)
Globulin, Total: 2.8 g/dL (ref 1.5–4.5)
Glucose: 81 mg/dL (ref 70–99)
Potassium: 4 mmol/L (ref 3.5–5.2)
Sodium: 140 mmol/L (ref 134–144)
Total Protein: 7.2 g/dL (ref 6.0–8.5)
eGFR: 73 mL/min/1.73

## 2024-08-08 LAB — LIPID PANEL
Cholesterol, Total: 147 mg/dL (ref 100–199)
HDL: 71 mg/dL
LDL CALC COMMENT:: 2.1 ratio (ref 0.0–4.4)
LDL Chol Calc (NIH): 58 mg/dL (ref 0–99)
Triglycerides: 101 mg/dL (ref 0–149)
VLDL Cholesterol Cal: 18 mg/dL (ref 5–40)

## 2024-08-08 LAB — CBC WITH DIFFERENTIAL/PLATELET
Basophils Absolute: 0.1 x10E3/uL (ref 0.0–0.2)
Basos: 1 %
EOS (ABSOLUTE): 0.1 x10E3/uL (ref 0.0–0.4)
Eos: 1 %
Hematocrit: 43.8 % (ref 34.0–46.6)
Hemoglobin: 14.3 g/dL (ref 11.1–15.9)
Immature Grans (Abs): 0 x10E3/uL (ref 0.0–0.1)
Immature Granulocytes: 0 %
Lymphocytes Absolute: 2.3 x10E3/uL (ref 0.7–3.1)
Lymphs: 30 %
MCH: 28.5 pg (ref 26.6–33.0)
MCHC: 32.6 g/dL (ref 31.5–35.7)
MCV: 87 fL (ref 79–97)
Monocytes Absolute: 0.7 x10E3/uL (ref 0.1–0.9)
Monocytes: 9 %
Neutrophils Absolute: 4.4 x10E3/uL (ref 1.4–7.0)
Neutrophils: 59 %
Platelets: 299 x10E3/uL (ref 150–450)
RBC: 5.02 x10E6/uL (ref 3.77–5.28)
RDW: 12.9 % (ref 11.7–15.4)
WBC: 7.5 x10E3/uL (ref 3.4–10.8)

## 2024-08-10 ENCOUNTER — Ambulatory Visit: Payer: Self-pay | Admitting: Cardiology

## 2024-08-15 ENCOUNTER — Telehealth: Payer: Self-pay | Admitting: Cardiology

## 2024-08-15 ENCOUNTER — Ambulatory Visit: Payer: Medicare HMO

## 2024-08-15 DIAGNOSIS — I2510 Atherosclerotic heart disease of native coronary artery without angina pectoris: Secondary | ICD-10-CM

## 2024-08-15 NOTE — Telephone Encounter (Signed)
 Janit Geni CROME, RN to TAHIRY SPICER     08/10/24 10:50 AM Hi Ms. [place], Dr. Shlomo has reviewed your labs and feels they are normal. Please continue your current medications and let me know if you have any questions, Geni, RN   This MyChart message has not been read. Comp Met (CMET)  The patient has been notified of the result and verbalized understanding.  All questions (if any) were answered. Sharlet Lerner [place], RN 08/15/2024 2:53 PM

## 2024-08-15 NOTE — Telephone Encounter (Signed)
 Pt would like a c/b regarding results. Please advise

## 2024-08-16 LAB — CUP PACEART REMOTE DEVICE CHECK
Battery Remaining Longevity: 79 mo
Battery Remaining Percentage: 84 %
Battery Voltage: 2.99 V
Brady Statistic AP VP Percent: 1 %
Brady Statistic AP VS Percent: 77 %
Brady Statistic AS VP Percent: 1 %
Brady Statistic AS VS Percent: 22 %
Brady Statistic RA Percent Paced: 76 %
Brady Statistic RV Percent Paced: 1 %
Date Time Interrogation Session: 20260114020032
Implantable Lead Connection Status: 753985
Implantable Lead Connection Status: 753985
Implantable Lead Implant Date: 20130328
Implantable Lead Implant Date: 20130328
Implantable Lead Location: 753859
Implantable Lead Location: 753860
Implantable Pulse Generator Implant Date: 20240116
Lead Channel Impedance Value: 340 Ohm
Lead Channel Impedance Value: 360 Ohm
Lead Channel Pacing Threshold Amplitude: 0.5 V
Lead Channel Pacing Threshold Amplitude: 1.5 V
Lead Channel Pacing Threshold Pulse Width: 0.5 ms
Lead Channel Pacing Threshold Pulse Width: 0.5 ms
Lead Channel Sensing Intrinsic Amplitude: 2.7 mV
Lead Channel Sensing Intrinsic Amplitude: 7.5 mV
Lead Channel Setting Pacing Amplitude: 2 V
Lead Channel Setting Pacing Amplitude: 3 V
Lead Channel Setting Pacing Pulse Width: 0.5 ms
Lead Channel Setting Sensing Sensitivity: 2 mV
Pulse Gen Model: 2272
Pulse Gen Serial Number: 5680516

## 2024-08-17 ENCOUNTER — Ambulatory Visit: Payer: Self-pay | Admitting: Cardiovascular Disease

## 2024-08-20 NOTE — Telephone Encounter (Signed)
-----   Message from Wilbert Bihari, MD sent at 08/10/2024  8:59 AM EST ----- Please let patient know that labs were normal.  Continue current medical therapy.

## 2024-08-20 NOTE — Telephone Encounter (Signed)
 Call to let patient know that labs were normal. Patient verbalizes understanding and agrees to continue current medical therapy.

## 2024-08-23 NOTE — Progress Notes (Signed)
 Remote PPM Transmission

## 2024-11-14 ENCOUNTER — Ambulatory Visit

## 2025-02-13 ENCOUNTER — Ambulatory Visit

## 2025-05-15 ENCOUNTER — Ambulatory Visit

## 2025-08-14 ENCOUNTER — Ambulatory Visit

## 2025-11-13 ENCOUNTER — Ambulatory Visit
